# Patient Record
Sex: Female | Born: 1935 | Race: White | Hispanic: No | Marital: Single | Smoking: Former smoker
Health system: Southern US, Community
[De-identification: ages and names within clinical notes are randomized; demographics above are authoritative.]

## PROBLEM LIST (undated history)

## (undated) DIAGNOSIS — I639 Cerebral infarction, unspecified: Secondary | ICD-10-CM

## (undated) DIAGNOSIS — J189 Pneumonia, unspecified organism: Secondary | ICD-10-CM

## (undated) DIAGNOSIS — R569 Unspecified convulsions: Secondary | ICD-10-CM

## (undated) DIAGNOSIS — M1990 Unspecified osteoarthritis, unspecified site: Secondary | ICD-10-CM

## (undated) DIAGNOSIS — F32A Depression, unspecified: Secondary | ICD-10-CM

## (undated) DIAGNOSIS — E785 Hyperlipidemia, unspecified: Secondary | ICD-10-CM

## (undated) DIAGNOSIS — I4891 Unspecified atrial fibrillation: Secondary | ICD-10-CM

## (undated) DIAGNOSIS — F419 Anxiety disorder, unspecified: Secondary | ICD-10-CM

## (undated) DIAGNOSIS — R55 Syncope and collapse: Secondary | ICD-10-CM

## (undated) DIAGNOSIS — I10 Essential (primary) hypertension: Secondary | ICD-10-CM

## (undated) DIAGNOSIS — E039 Hypothyroidism, unspecified: Secondary | ICD-10-CM

## (undated) DIAGNOSIS — F329 Major depressive disorder, single episode, unspecified: Secondary | ICD-10-CM

## (undated) DIAGNOSIS — I82409 Acute embolism and thrombosis of unspecified deep veins of unspecified lower extremity: Secondary | ICD-10-CM

## (undated) DIAGNOSIS — R42 Dizziness and giddiness: Secondary | ICD-10-CM

## (undated) HISTORY — PX: PLEURAL SCARIFICATION: SHX748

## (undated) HISTORY — DX: Essential (primary) hypertension: I10

## (undated) HISTORY — DX: Hyperlipidemia, unspecified: E78.5

## (undated) HISTORY — PX: JOINT REPLACEMENT: SHX530

## (undated) HISTORY — DX: Unspecified atrial fibrillation: I48.91

## (undated) HISTORY — PX: TONSILLECTOMY AND ADENOIDECTOMY: SUR1326

---

## 1979-09-20 HISTORY — PX: LUMBAR DISC SURGERY: SHX700

## 1980-09-19 HISTORY — PX: APPENDECTOMY: SHX54

## 1980-09-19 HISTORY — PX: VAGINAL HYSTERECTOMY: SUR661

## 2001-01-11 ENCOUNTER — Encounter (INDEPENDENT_AMBULATORY_CARE_PROVIDER_SITE_OTHER): Payer: Self-pay | Admitting: Specialist

## 2001-01-11 ENCOUNTER — Other Ambulatory Visit: Admission: RE | Admit: 2001-01-11 | Discharge: 2001-01-11 | Payer: Self-pay | Admitting: Internal Medicine

## 2005-12-06 ENCOUNTER — Ambulatory Visit: Payer: Self-pay | Admitting: Internal Medicine

## 2005-12-20 ENCOUNTER — Ambulatory Visit: Payer: Self-pay | Admitting: Internal Medicine

## 2006-06-19 ENCOUNTER — Encounter: Admission: RE | Admit: 2006-06-19 | Discharge: 2006-06-28 | Payer: Self-pay | Admitting: Family Medicine

## 2010-05-10 ENCOUNTER — Emergency Department (HOSPITAL_COMMUNITY): Admission: EM | Admit: 2010-05-10 | Discharge: 2010-05-10 | Payer: Self-pay | Admitting: Family Medicine

## 2011-05-21 ENCOUNTER — Emergency Department (HOSPITAL_COMMUNITY): Payer: Medicare Other

## 2011-05-21 ENCOUNTER — Inpatient Hospital Stay (HOSPITAL_COMMUNITY)
Admission: EM | Admit: 2011-05-21 | Discharge: 2011-05-27 | DRG: 023 | Disposition: A | Discharge status: Rehabilitation Facility | Payer: Medicare Other | Source: Ambulatory Visit | Attending: Neurology | Admitting: Neurology

## 2011-05-21 DIAGNOSIS — G8190 Hemiplegia, unspecified affecting unspecified side: Secondary | ICD-10-CM | POA: Diagnosis present

## 2011-05-21 DIAGNOSIS — I6340 Cerebral infarction due to embolism of unspecified cerebral artery: Principal | ICD-10-CM | POA: Diagnosis present

## 2011-05-21 DIAGNOSIS — J9600 Acute respiratory failure, unspecified whether with hypoxia or hypercapnia: Secondary | ICD-10-CM | POA: Diagnosis present

## 2011-05-21 DIAGNOSIS — R1310 Dysphagia, unspecified: Secondary | ICD-10-CM | POA: Diagnosis present

## 2011-05-21 DIAGNOSIS — R29810 Facial weakness: Secondary | ICD-10-CM | POA: Diagnosis present

## 2011-05-21 DIAGNOSIS — I4891 Unspecified atrial fibrillation: Secondary | ICD-10-CM | POA: Diagnosis present

## 2011-05-21 DIAGNOSIS — I10 Essential (primary) hypertension: Secondary | ICD-10-CM | POA: Diagnosis present

## 2011-05-21 DIAGNOSIS — E876 Hypokalemia: Secondary | ICD-10-CM | POA: Diagnosis not present

## 2011-05-21 DIAGNOSIS — R42 Dizziness and giddiness: Secondary | ICD-10-CM | POA: Diagnosis present

## 2011-05-21 DIAGNOSIS — D72829 Elevated white blood cell count, unspecified: Secondary | ICD-10-CM | POA: Diagnosis present

## 2011-05-21 DIAGNOSIS — I639 Cerebral infarction, unspecified: Secondary | ICD-10-CM

## 2011-05-21 HISTORY — DX: Cerebral infarction, unspecified: I63.9

## 2011-05-21 HISTORY — PX: THROMBOLYSIS: SHX2508

## 2011-05-21 LAB — COMPREHENSIVE METABOLIC PANEL WITH GFR
ALT: 24 U/L (ref 0–35)
AST: 32 U/L (ref 0–37)
Albumin: 3.5 g/dL (ref 3.5–5.2)
Alkaline Phosphatase: 50 U/L (ref 39–117)
BUN: 18 mg/dL (ref 6–23)
CO2: 24 meq/L (ref 19–32)
Calcium: 9.8 mg/dL (ref 8.4–10.5)
Chloride: 98 meq/L (ref 96–112)
Creatinine, Ser: 0.85 mg/dL (ref 0.50–1.10)
GFR calc Af Amer: >60 mL/min
GFR calc non Af Amer: >60 mL/min
Glucose, Bld: 129 mg/dL — ABNORMAL HIGH (ref 70–99)
Potassium: 3.8 meq/L (ref 3.5–5.1)
Sodium: 134 meq/L — ABNORMAL LOW (ref 135–145)
Total Bilirubin: 0.3 mg/dL (ref 0.3–1.2)
Total Protein: 7 g/dL (ref 6.0–8.3)

## 2011-05-21 LAB — CBC
HCT: 37.9 % (ref 36.0–46.0)
Hemoglobin: 13.3 g/dL (ref 12.0–15.0)
MCH: 31.5 pg (ref 26.0–34.0)
MCHC: 35.1 g/dL (ref 30.0–36.0)
MCV: 89.8 fL (ref 78.0–100.0)
Platelets: 171 K/uL (ref 150–400)
RBC: 4.22 MIL/uL (ref 3.87–5.11)
RDW: 13.6 % (ref 11.5–15.5)
WBC: 8.3 K/uL (ref 4.0–10.5)

## 2011-05-21 LAB — PROTIME-INR: Prothrombin Time: 12.9 seconds (ref 11.6–15.2)

## 2011-05-21 LAB — URINALYSIS, ROUTINE W REFLEX MICROSCOPIC
Bilirubin Urine: NEGATIVE
Glucose, UA: NEGATIVE mg/dL
Hgb urine dipstick: NEGATIVE
Ketones, ur: NEGATIVE mg/dL
Leukocytes, UA: NEGATIVE
Nitrite: NEGATIVE
Protein, ur: NEGATIVE mg/dL
Specific Gravity, Urine: 1.014 (ref 1.005–1.030)
Urobilinogen, UA: 0.2 mg/dL (ref 0.0–1.0)
pH: 7.5 (ref 5.0–8.0)

## 2011-05-21 LAB — GLUCOSE, CAPILLARY: Glucose-Capillary: 129 mg/dL — ABNORMAL HIGH (ref 70–99)

## 2011-05-21 LAB — DIFFERENTIAL
Eosinophils Absolute: 0.1 10*3/uL (ref 0.0–0.7)
Lymphs Abs: 1.5 10*3/uL (ref 0.7–4.0)
Neutro Abs: 5.9 10*3/uL (ref 1.7–7.7)
Neutrophils Relative %: 71 % (ref 43–77)

## 2011-05-21 LAB — POCT I-STAT, CHEM 8
HCT: 45 % (ref 36.0–46.0)
Hemoglobin: 15.3 g/dL — ABNORMAL HIGH (ref 12.0–15.0)
Sodium: 135 mEq/L (ref 135–145)
TCO2: 22 mmol/L (ref 0–100)

## 2011-05-21 LAB — CK TOTAL AND CKMB (NOT AT ARMC)
CK, MB: 3 ng/mL (ref 0.3–4.0)
Relative Index: INVALID (ref 0.0–2.5)
Total CK: 88 U/L (ref 7–177)

## 2011-05-21 LAB — APTT: aPTT: 27 seconds (ref 24–37)

## 2011-05-21 MED ORDER — IOHEXOL 350 MG/ML SOLN
60.0000 mL | Freq: Once | INTRAVENOUS | Status: AC | PRN
  Administered 2011-05-21: 60 mL via INTRAVENOUS

## 2011-05-21 MED ORDER — IOHEXOL 300 MG/ML  SOLN
200.0000 mL | Freq: Once | INTRAMUSCULAR | Status: AC | PRN
  Administered 2011-05-21: 80 mL via INTRA_ARTERIAL

## 2011-05-22 ENCOUNTER — Inpatient Hospital Stay (HOSPITAL_COMMUNITY): Payer: Medicare Other

## 2011-05-22 DIAGNOSIS — I6330 Cerebral infarction due to thrombosis of unspecified cerebral artery: Secondary | ICD-10-CM

## 2011-05-22 DIAGNOSIS — Z9911 Dependence on respirator [ventilator] status: Secondary | ICD-10-CM

## 2011-05-22 DIAGNOSIS — J9600 Acute respiratory failure, unspecified whether with hypoxia or hypercapnia: Secondary | ICD-10-CM

## 2011-05-22 LAB — CBC
MCV: 89.2 fL (ref 78.0–100.0)
Platelets: 154 10*3/uL (ref 150–400)
RBC: 3.24 MIL/uL — ABNORMAL LOW (ref 3.87–5.11)
WBC: 8.4 10*3/uL (ref 4.0–10.5)

## 2011-05-22 LAB — BASIC METABOLIC PANEL
BUN: 11 mg/dL (ref 6–23)
BUN: 8 mg/dL (ref 6–23)
CO2: 22 mEq/L (ref 19–32)
CO2: 23 mEq/L (ref 19–32)
Calcium: 7.8 mg/dL — ABNORMAL LOW (ref 8.4–10.5)
Chloride: 109 mEq/L (ref 96–112)
Chloride: 105 mEq/L (ref 96–112)
Chloride: 105 mEq/L (ref 96–112)
Creatinine, Ser: 0.64 mg/dL (ref 0.50–1.10)
GFR calc Af Amer: >60 mL/min (ref >60)
Glucose, Bld: 101 mg/dL — ABNORMAL HIGH (ref 70–99)
Potassium: 2.9 mEq/L — ABNORMAL LOW (ref 3.5–5.1)
Potassium: 3.2 mEq/L — ABNORMAL LOW (ref 3.5–5.1)
Sodium: 139 mEq/L (ref 135–145)
Sodium: 136 mEq/L (ref 135–145)

## 2011-05-22 LAB — DIFFERENTIAL
Basophils Absolute: 0 10*3/uL (ref 0.0–0.1)
Eosinophils Absolute: 0 10*3/uL (ref 0.0–0.7)
Lymphs Abs: 1.1 10*3/uL (ref 0.7–4.0)
Neutrophils Relative %: 75 % (ref 43–77)

## 2011-05-22 LAB — BLOOD GAS, ARTERIAL
Acid-base deficit: 1.4 mmol/L (ref 0.0–2.0)
Drawn by: 331761
FIO2: 1 %
RATE: 12 resp/min
TCO2: 23.8 mmol/L (ref 0–100)
pCO2 arterial: 37.2 mmHg (ref 35.0–45.0)
pO2, Arterial: 407 mmHg — ABNORMAL HIGH (ref 80.0–100.0)

## 2011-05-22 LAB — GLUCOSE, CAPILLARY
Glucose-Capillary: 119 mg/dL — ABNORMAL HIGH (ref 70–99)
Glucose-Capillary: 98 mg/dL (ref 70–99)

## 2011-05-22 LAB — LIPID PANEL
Cholesterol: 158 mg/dL (ref 0–200)
HDL: 74 mg/dL (ref >39)
Total CHOL/HDL Ratio: 2.1 RATIO
Triglycerides: 72 mg/dL (ref <150)

## 2011-05-22 LAB — MRSA PCR SCREENING: MRSA by PCR: NEGATIVE

## 2011-05-22 LAB — HOMOCYSTEINE: Homocysteine: 4.3 umol/L (ref 4.0–15.4)

## 2011-05-23 DIAGNOSIS — Z9911 Dependence on respirator [ventilator] status: Secondary | ICD-10-CM

## 2011-05-23 DIAGNOSIS — J9600 Acute respiratory failure, unspecified whether with hypoxia or hypercapnia: Secondary | ICD-10-CM

## 2011-05-23 DIAGNOSIS — I6330 Cerebral infarction due to thrombosis of unspecified cerebral artery: Secondary | ICD-10-CM

## 2011-05-23 LAB — CBC
Hemoglobin: 12.2 g/dL (ref 12.0–15.0)
MCH: 31.9 pg (ref 26.0–34.0)
MCHC: 35.3 g/dL (ref 30.0–36.0)
Platelets: 169 10*3/uL (ref 150–400)
RDW: 13.8 % (ref 11.5–15.5)

## 2011-05-23 LAB — GLUCOSE, CAPILLARY
Glucose-Capillary: 94 mg/dL (ref 70–99)
Glucose-Capillary: 96 mg/dL (ref 70–99)
Glucose-Capillary: 99 mg/dL (ref 70–99)

## 2011-05-23 LAB — BASIC METABOLIC PANEL
Calcium: 8.5 mg/dL (ref 8.4–10.5)
GFR calc Af Amer: >60 mL/min (ref >60)
GFR calc non Af Amer: >60 mL/min (ref >60)
Glucose, Bld: 88 mg/dL (ref 70–99)
Sodium: 135 mEq/L (ref 135–145)

## 2011-05-23 NOTE — H&P (Signed)
[name], Rollins NO.:  [id number]  MEDICAL RECORD NO.:  [id number]  LOCATION:  3108                         FACILITY:  [facility]  PHYSICIAN:  Thana Farr, MD    DATE OF BIRTH:  10-Apr-1931  DATE OF ADMISSION:  05/21/2011 DATE OF DISCHARGE:                             HISTORY & PHYSICAL   ADMISSION DIAGNOSIS:  Cerebrovascular accident.  HISTORY:  [name] is a 77 year old female that was last seen normal by her husband at approximately 1500.  At 1510, the patient was found by the husband after hearing a thud in the bathroom.  He found the patient on the floor with left-sided weakness.  Patient was brought in by EMS as a code stroke.  Initial NIH stroke scale was 16.  PAST MEDICAL HISTORY:  Hypertension and vertigo.  MEDICATIONS:  Premarin and antihypertensives.  ALLERGIES:  No known drug allergies.  SOCIAL HISTORY:  The patient is married.  She has no history of alcohol, tobacco, or illicit drug abuse.  PHYSICAL EXAMINATION:  VITAL SIGNS:  Blood pressure 128/64, heart rate 98, respiratory rate 18, and O2 sat 100% on 2 liters nasal cannula. NEUROLOGIC:  The patient is awake and responsive.  There is no evidence of aphasia.  She does follow simple commands.  There is some dysarthria present.  On cranial nerve testing II, the patient has a questionable left homonymous hemianopsia.  III, IV, VI there is right eye deviation. The patient is unable to cross midline.  V and VII left facial droop. VIII grossly intact.  IX and X decreased gag.  XI and XII unable to test on motor testing.  The patient is able to maintain the right upper extremity and the right lower extremity against gravity.  Unable to maintain the left against gravity.  Sensory exam responds to painful stimuli throughout.  Deep tendon reflexes are 3+ with absent ankle jerks bilaterally.  Plantars are upgoing bilaterally.  LABORATORY DATA:  Sodium of 135, potassium 3.8, chloride 103, bicarb  22, BUN 18, creatinine 1.1, glucose 128, hemoglobin 15.3, hematocrit 45, INR 0.95.  CT shows no acute changes.  ASSESSMENT:  [name] is a 77 year old female presented with a right eye deviation and left-sided weakness and numbness.  Right brain stroke is suspected.  EKG shows AFib.  Suspect embolic etiology.  Risks and benefits of t-PA were discussed with the family.  He has agreed to t-PA.  PLAN: 1. IV t-PA administration. 2. Computed tomography angiography to rule out thrombus that is a     minimal to interventional therapy. 3. Admit to 3100.  ADDENDUM:  CTA performed and showed a right M1 occlusion that was persistent despite IV t-PA.  Dr. Corliss Skains was contacted.  Husband was spoken to at length about the options from this point forward and wishes for everything to be done.  Attempt for retrieval and removal of clot to be performed.  The patient is considered critically ill.  Further decompensation, the patient is at high risk for further neurologic decompensation.  High- level decision making was required and care of this patient.  Two and half hours of face-to-face time was spent in management and care of the patient.  ______________________________ Thana Farr, MD     LR/MEDQ  D:  05/21/2011  T:  05/21/2011  Job:  [id number]  Electronically Signed by Thana Farr MD on 05/23/2011 06:24:39 AM

## 2011-05-24 ENCOUNTER — Inpatient Hospital Stay (HOSPITAL_COMMUNITY): Payer: Medicare Other

## 2011-05-24 ENCOUNTER — Telehealth: Payer: Self-pay | Admitting: Cardiology

## 2011-05-24 DIAGNOSIS — I6330 Cerebral infarction due to thrombosis of unspecified cerebral artery: Secondary | ICD-10-CM

## 2011-05-24 LAB — BASIC METABOLIC PANEL
BUN: 11 mg/dL (ref 6–23)
Creatinine, Ser: 0.56 mg/dL (ref 0.50–1.10)
GFR calc Af Amer: >60 mL/min (ref >60)
GFR calc non Af Amer: >60 mL/min (ref >60)
Potassium: 3.7 mEq/L (ref 3.5–5.1)

## 2011-05-24 LAB — CBC
HCT: 35 % — ABNORMAL LOW (ref 36.0–46.0)
MCHC: 34.3 g/dL (ref 30.0–36.0)
MCV: 90.4 fL (ref 78.0–100.0)
RDW: 13.7 % (ref 11.5–15.5)

## 2011-05-24 NOTE — Telephone Encounter (Signed)
Left message for pt and husband that Dr Antoine Poche will be glad to see patient and they should call back to schedule an appointment.  Also left message that we have not received an EKG but if and when we do Dr Antoine Poche will review.

## 2011-05-24 NOTE — Telephone Encounter (Signed)
Pt husband calling. Pt husband is a pt of Dr. Antoine Poche and wanted to know if Dr. Antoine Poche can see pt wife as a new pt. Pt wife had a stroke on Saturday and had a electrocardiogram done while at the hospital. Pt husband requested that EKG be sent to our office for Dr. Antoine Poche to review. Please return pt husband call to advise whether or not Dr. Antoine Poche will see pt wife and/or his opinion of pt EKG.

## 2011-05-25 ENCOUNTER — Inpatient Hospital Stay (HOSPITAL_COMMUNITY): Payer: Medicare Other

## 2011-05-25 DIAGNOSIS — I4891 Unspecified atrial fibrillation: Secondary | ICD-10-CM

## 2011-05-25 LAB — GLUCOSE, CAPILLARY
Glucose-Capillary: 139 mg/dL — ABNORMAL HIGH (ref 70–99)
Glucose-Capillary: 163 mg/dL — ABNORMAL HIGH (ref 70–99)
Glucose-Capillary: 137 mg/dL — ABNORMAL HIGH (ref 70–99)

## 2011-05-26 LAB — BASIC METABOLIC PANEL
BUN: 12 mg/dL (ref 6–23)
Calcium: 8.6 mg/dL (ref 8.4–10.5)
Creatinine, Ser: <0.47 mg/dL — ABNORMAL LOW (ref 0.50–1.10)
Potassium: 3.9 mEq/L (ref 3.5–5.1)

## 2011-05-26 LAB — CBC
HCT: 35.4 % — ABNORMAL LOW (ref 36.0–46.0)
MCH: 31.6 pg (ref 26.0–34.0)
MCHC: 35.3 g/dL (ref 30.0–36.0)
MCV: 89.6 fL (ref 78.0–100.0)
RDW: 13.5 % (ref 11.5–15.5)

## 2011-05-26 LAB — GLUCOSE, CAPILLARY: Glucose-Capillary: 129 mg/dL — ABNORMAL HIGH (ref 70–99)

## 2011-05-27 ENCOUNTER — Inpatient Hospital Stay (HOSPITAL_COMMUNITY): Payer: Medicare Other

## 2011-05-27 ENCOUNTER — Inpatient Hospital Stay (HOSPITAL_COMMUNITY)
Admission: RE | Admit: 2011-05-27 | Discharge: 2011-06-13 | DRG: 945 | Disposition: A | Discharge status: Skilled Nursing Facility | Payer: Medicare Other | Source: Other Acute Inpatient Hospital | Attending: Physical Medicine & Rehabilitation | Admitting: Physical Medicine & Rehabilitation

## 2011-05-27 DIAGNOSIS — I6330 Cerebral infarction due to thrombosis of unspecified cerebral artery: Secondary | ICD-10-CM

## 2011-05-27 DIAGNOSIS — I6340 Cerebral infarction due to embolism of unspecified cerebral artery: Secondary | ICD-10-CM | POA: Diagnosis present

## 2011-05-27 DIAGNOSIS — I4891 Unspecified atrial fibrillation: Secondary | ICD-10-CM | POA: Diagnosis present

## 2011-05-27 DIAGNOSIS — Z5189 Encounter for other specified aftercare: Secondary | ICD-10-CM

## 2011-05-27 DIAGNOSIS — R471 Dysarthria and anarthria: Secondary | ICD-10-CM | POA: Diagnosis present

## 2011-05-27 DIAGNOSIS — E785 Hyperlipidemia, unspecified: Secondary | ICD-10-CM | POA: Diagnosis present

## 2011-05-27 DIAGNOSIS — R1310 Dysphagia, unspecified: Secondary | ICD-10-CM | POA: Diagnosis present

## 2011-05-27 DIAGNOSIS — Z7901 Long term (current) use of anticoagulants: Secondary | ICD-10-CM

## 2011-05-27 DIAGNOSIS — G8190 Hemiplegia, unspecified affecting unspecified side: Secondary | ICD-10-CM | POA: Diagnosis present

## 2011-05-27 LAB — GLUCOSE, CAPILLARY
Glucose-Capillary: 111 mg/dL — ABNORMAL HIGH (ref 70–99)
Glucose-Capillary: 113 mg/dL — ABNORMAL HIGH (ref 70–99)
Glucose-Capillary: 123 mg/dL — ABNORMAL HIGH (ref 70–99)

## 2011-05-27 LAB — MRSA PCR SCREENING: MRSA by PCR: NEGATIVE

## 2011-05-28 ENCOUNTER — Inpatient Hospital Stay (HOSPITAL_COMMUNITY): Payer: Medicare Other

## 2011-05-28 DIAGNOSIS — I6330 Cerebral infarction due to thrombosis of unspecified cerebral artery: Secondary | ICD-10-CM

## 2011-05-28 LAB — PROTIME-INR: Prothrombin Time: 14 seconds (ref 11.6–15.2)

## 2011-05-29 LAB — URINALYSIS, MICROSCOPIC ONLY
Bilirubin Urine: NEGATIVE
Glucose, UA: NEGATIVE mg/dL
Hgb urine dipstick: NEGATIVE
Protein, ur: NEGATIVE mg/dL
Urobilinogen, UA: 0.2 mg/dL (ref 0.0–1.0)

## 2011-05-29 LAB — PROTIME-INR: Prothrombin Time: 14.6 seconds (ref 11.6–15.2)

## 2011-05-30 DIAGNOSIS — I6330 Cerebral infarction due to thrombosis of unspecified cerebral artery: Secondary | ICD-10-CM

## 2011-05-30 LAB — CBC
Hemoglobin: 13 g/dL (ref 12.0–15.0)
MCH: 31.6 pg (ref 26.0–34.0)
MCHC: 35.3 g/dL (ref 30.0–36.0)
Platelets: 331 10*3/uL (ref 150–400)
RBC: 4.12 MIL/uL (ref 3.87–5.11)

## 2011-05-30 LAB — PROTIME-INR
INR: 1.31 (ref 0.00–1.49)
Prothrombin Time: 16.5 seconds — ABNORMAL HIGH (ref 11.6–15.2)

## 2011-05-30 LAB — DIFFERENTIAL
Basophils Absolute: 0.1 10*3/uL (ref 0.0–0.1)
Basophils Relative: 0 % (ref 0–1)
Eosinophils Absolute: 0.3 10*3/uL (ref 0.0–0.7)
Monocytes Absolute: 1.3 10*3/uL — ABNORMAL HIGH (ref 0.1–1.0)
Monocytes Relative: 11 % (ref 3–12)
Neutro Abs: 8.6 10*3/uL — ABNORMAL HIGH (ref 1.7–7.7)
Neutrophils Relative %: 74 % (ref 43–77)

## 2011-05-30 LAB — COMPREHENSIVE METABOLIC PANEL
AST: 40 U/L — ABNORMAL HIGH (ref 0–37)
Albumin: 2.7 g/dL — ABNORMAL LOW (ref 3.5–5.2)
BUN: 13 mg/dL (ref 6–23)
Calcium: 9.3 mg/dL (ref 8.4–10.5)
Chloride: 104 mEq/L (ref 96–112)
Creatinine, Ser: 0.7 mg/dL (ref 0.50–1.10)
Total Protein: 6.7 g/dL (ref 6.0–8.3)

## 2011-05-30 NOTE — Consult Note (Signed)
[name], [name] NO.:  [id number]  MEDICAL RECORD NO.:  [id number]  LOCATION:  3108                         FACILITY:  [facility]  PHYSICIAN:  Luis Abed, MD, FACCDATE OF BIRTH:  14-Apr-1931  DATE OF CONSULTATION: DATE OF DISCHARGE:                                CONSULTATION   The patient is admitted with a CVA.  She has not been seen by our cardiology group before.  Her husband is followed by Dr. Antoine Rollins and he requests that the patient to be followed by Dr. Antoine Rollins over time.  The patient was noted to be in atrial fibrillation upon arrival.  It is thought that most likely her stroke is related to her atrial fibrillation.  The patient was found by her husband on the bathroom floor.  She had left-sided weakness and she was brought to the emergency room with Code Stroke.  She received very careful and aggressive treatment.  Decision was made to proceed with tPA and direct intervention.  This was done successfully.  The patient says that she thinks she may have had atrial fibrillation. However, the husband is not aware of any formal diagnosis nor are the other family members.  There is no prior cardiac history that we are aware of.  There is a history of hypertension.  With admission with her stroke, the patient actually had respiratory failure which was treated by Critical Care Medicine and she is now extubated and quite stable.  PAST MEDICAL HISTORY:  ALLERGIES:  NO KNOWN DRUG ALLERGIES.  MEDICATIONS:  On admission, her meds included calcium, Norvasc 5 mg daily, multivitamin, fish oil, and Premarin.  For other medical problems, see the complete list below.  SOCIAL HISTORY:  The patient is married, lives with her husband.  She does not smoke.  FAMILY HISTORY:  The family history is not significant for coronary artery disease.  REVIEW OF SYSTEMS:  Review of systems at the moment is limited.  The patient has had a stroke and has left-sided  weakness.  She is not having any chest pain at this time.  She is not complaining of a headache.  She is drowsy.  Otherwise, her review of systems is limited.  PHYSICAL EXAMINATION:  VITAL SIGNS:  Blood pressure is 113/68.  Pulse is 115. GENERAL:  The patient as mentioned is alert. HEENT:  Head is atraumatic.  There is no jugular venous distention. LUNGS:  Reveal a few scattered rhonchi.  There is no respiratory distress. CARDIAC:  Exam reveals S1-S2.  There are no clicks or significant murmurs.  The rhythm is irregularly irregular. ABDOMEN:  Soft.  There is no peripheral edema. There are no musculoskeletal deformities.  The patient does have ecchymoses on her arms.  LABORATORY DATA:  Hemoglobin is 12.0.  BUN is 11 with creatinine 0.56 and potassium 3.7.  The chest x-ray on admission revealed no evidence of acute cardiopulmonary disease.  The patient had head CTs and interventional procedure to her head.  EKG reveals low voltage.  There is no diagnostic QRS change.  The rhythm is atrial fibrillation.  Two- dimensional echo was done.  The ejection fraction is 50-55%.  There are no focal wall motion  abnormalities.  No obvious clear-cut shunt could be seen.  There is no significant pericardial effusion.  PROBLEMS:  Include 1. Hypertension. 2. Ejection fraction 50-55%. 3. Low voltage QRS.  There is no evidence of significant pericardial     effusion.  This can be followed. 4. Respiratory failure with the patient's admission with her stroke     and this is now resolved. 5. Acute large cerebrovascular accident.  The patient was treated with     tPA and mechanical reperfusion.  She is recovering slowly. 6. Atrial fibrillation.  The duration is unknown.  As far as I can     tell this has not been treated medically in any way over time.  At     this point, I am recommending no further cardiac procedures.  The     family has asked me about the possibility of transesophageal echo.     I am  not formally recommending that at this point unless the Stroke     Team feels that it would be helpful to gather more information.  It     appears that her stroke most likely is related to her atrial     fibrillation.  The key issues for her atrial fibrillation are rate control and anticoagulation.  The patient will be appropriate anticoagulated by the Stroke Team when it is allowable.  For rate control, we could use either Cardizem or digoxin.  Because she is already on Norvasc for blood pressure control, I feel it would be helpful to try to switch her from Norvasc to Cardizem.  Hopefully, this will keep a similar type of blood pressure control and help control her heart rate.  If her blood pressure is too low to use this medication digoxin could be used.  She is stable with her current heart rate.     Luis Abed, MD, [facility]     JDK/MEDQ  D:  05/25/2011  T:  05/25/2011  Job:  [id number]  Electronically Signed by Willa Rough MD FACC on 05/30/2011 11:41:18 AM

## 2011-05-31 DIAGNOSIS — G8110 Spastic hemiplegia affecting unspecified side: Secondary | ICD-10-CM

## 2011-05-31 DIAGNOSIS — I6330 Cerebral infarction due to thrombosis of unspecified cerebral artery: Secondary | ICD-10-CM

## 2011-05-31 LAB — URINE MICROSCOPIC-ADD ON

## 2011-05-31 LAB — URINALYSIS, ROUTINE W REFLEX MICROSCOPIC
Ketones, ur: NEGATIVE mg/dL
Protein, ur: NEGATIVE mg/dL
Urobilinogen, UA: 0.2 mg/dL (ref 0.0–1.0)

## 2011-05-31 LAB — CBC
MCH: 31.5 pg (ref 26.0–34.0)
Platelets: 345 10*3/uL (ref 150–400)
RBC: 3.94 MIL/uL (ref 3.87–5.11)
RDW: 13.7 % (ref 11.5–15.5)
WBC: 10.8 10*3/uL — ABNORMAL HIGH (ref 4.0–10.5)

## 2011-05-31 LAB — URINE CULTURE
Colony Count: >=100000
Culture  Setup Time: 201209091126

## 2011-05-31 LAB — PROTIME-INR: Prothrombin Time: 18.7 seconds — ABNORMAL HIGH (ref 11.6–15.2)

## 2011-06-01 ENCOUNTER — Telehealth: Payer: Self-pay | Admitting: Cardiology

## 2011-06-01 LAB — PROTIME-INR
INR: 1.73 — ABNORMAL HIGH (ref 0.00–1.49)
Prothrombin Time: 20.6 seconds — ABNORMAL HIGH (ref 11.6–15.2)

## 2011-06-01 NOTE — Telephone Encounter (Signed)
Pt is at rehab at [facility].  He has question about her medical diagnosis.  Husband does not like what the rehab is telling him about how long she will be staying.   Please call him and discuss her care.

## 2011-06-01 NOTE — Telephone Encounter (Signed)
Per husband who is also Dr Hochrein's pt - requests that Dr Antoine Poche know that he would like the pt to stay at [facility] for as long as possible.  Instructed pt that Dr Antoine Poche really doesn't have anything to do with the evaluation of how long patient stays but that I would forward the message.

## 2011-06-02 LAB — PROTIME-INR
INR: 1.93 — ABNORMAL HIGH (ref 0.00–1.49)
Prothrombin Time: 22.4 seconds — ABNORMAL HIGH (ref 11.6–15.2)

## 2011-06-02 LAB — URINE CULTURE

## 2011-06-03 DIAGNOSIS — I6330 Cerebral infarction due to thrombosis of unspecified cerebral artery: Secondary | ICD-10-CM

## 2011-06-03 DIAGNOSIS — G8110 Spastic hemiplegia affecting unspecified side: Secondary | ICD-10-CM

## 2011-06-03 LAB — PROTIME-INR
INR: 2.12 — ABNORMAL HIGH (ref 0.00–1.49)
Prothrombin Time: 24.1 seconds — ABNORMAL HIGH (ref 11.6–15.2)

## 2011-06-04 LAB — PROTIME-INR
INR: 2.28 — ABNORMAL HIGH (ref 0.00–1.49)
Prothrombin Time: 25.5 seconds — ABNORMAL HIGH (ref 11.6–15.2)

## 2011-06-04 NOTE — Discharge Summary (Signed)
[name], [name]              ACCOUNT NO.:  [id number]  MEDICAL RECORD NO.:  [id number]  LOCATION:  3108                         FACILITY:  [facility]  PHYSICIAN:  Pramod P. Pearlean Brownie, MD    DATE OF BIRTH:  October 18, 1930  DATE OF ADMISSION:  05/21/2011 DATE OF DISCHARGE:  05/27/2011                              DISCHARGE SUMMARY   ADMISSION DIAGNOSIS:  Stroke.  DISCHARGE DIAGNOSES: 1. Right middle cerebral artery embolic stroke secondary to atrial     fibrillation, status post thrombolysis with IV t-PA and intra-     arterial t-PA of 8.5 mg as well as mechanical thrombectomy with a     Solitaire device with residual partial recanalization of the     superior division of the right middle cerebral artery. 2. Atrial fibrillation. 3. Dysphagia for stroke.  HOSPITAL COURSE:  [name] was 77 year old Caucasian lady who was admitted as a code stroke when she was found unresponsive by her husband when heard occurred in the bathroom.  She was brought in by EMS as a code stroke.  Initial NIH stroke scale was 16.  She was found to have right gaze deviation, inability to cross the midline, left facial weakness and dense left hemiplegia.  CT scan of the head on admission showed no acute abnormality.  EKG showed atrial fibrillation, which was new onset.  The patient was given full-dose IV t-PA and taken for an emergent cerebral catheter angiogram by Dr. Corliss Skains, who found occlusion of the right middle cerebral artery and the M1 segment.  The patient was given 8.5 mg of intra-arterial t-PA as well as her mechanical embolectomy was performed using the Solitaire device.  This resulted in recanalization of the middle cerebral artery, but there was persistent occlusion of the M2 branch of the right middle cerebral artery.  Her post-postprocedure CT scan showed some contrast extravasation in the right basal ganglia.  Subsequent followup CT scan showed large infarct in the right basal ganglia  involving small parts of the temporoparietal and insular cortex as well.  There was 6-mm right-to- left midline shift and cerebral edema.  The patient was kept in the Neurological Intensive Care Unit.  She was initially intubated and subsequently extubated the next day.  She was found to be drowsy, but arousable following commands.  She had some right gaze preference, which improved over the next few days.  She had persistent left upper extremity weakness, but she started moving the left lower extremity against gravity.  Followup CT scan showed stable appearance of the cerebral edema and midline shift.  He initially she had some eye-opening hyperoxia for the first few days, but on day of discharge, she was able to open her eyes widely and follow commands and she could easily mobilize past midline.  She had grade 1/5 strength in the left upper extremity and grade 3/5 in the left upper extremity.  She was not able to swallow initially and hence had a Panda tube and was given tube feeds, but at the time of discharge, she just passed the modified barium swallow and was advised a dysphagia 1 diet with pureed diet with nectar- thick liquids.  The plan was to start  Coumadin at the time of discharge and she was evaluated by Physical and Occupational Therapy and Rehab would not felt to be good patient for inpatient rehab.  She was discharged to rehab in a stable condition.  Instructions to follow up with Dr. Pearlean Brownie in his office in 2 months and primary care physician in a month.  MEDICATIONS AT THE TIME OF DISCHARGE: 1. Coumadin to be started by pharmacy and dosed to keep her INR 2-3. 2. Aspirin 81 mg daily until INR is 2-3 and then to be stopped. 3. Cardizem 60 mg q.6 hourly. 4. Lovenox 40 mg subcu DVT until INR is about 2, and then to be     stopped. 5. Multivitamins one tablet daily. 6. Lovaza 1 g daily.     Pramod P. Pearlean Brownie, MD     PPS/MEDQ  D:  05/27/2011  T:  05/27/2011  Job:   [id number]  Electronically Signed by Delia Heady MD on 06/04/2011 04:14:16 PM

## 2011-06-06 DIAGNOSIS — I6330 Cerebral infarction due to thrombosis of unspecified cerebral artery: Secondary | ICD-10-CM

## 2011-06-06 DIAGNOSIS — G8110 Spastic hemiplegia affecting unspecified side: Secondary | ICD-10-CM

## 2011-06-07 DIAGNOSIS — G8110 Spastic hemiplegia affecting unspecified side: Secondary | ICD-10-CM

## 2011-06-07 DIAGNOSIS — I6330 Cerebral infarction due to thrombosis of unspecified cerebral artery: Secondary | ICD-10-CM

## 2011-06-08 ENCOUNTER — Inpatient Hospital Stay (HOSPITAL_COMMUNITY): Payer: Medicare Other

## 2011-06-08 LAB — PROTIME-INR: INR: 2.56 — ABNORMAL HIGH (ref 0.00–1.49)

## 2011-06-09 DIAGNOSIS — G8110 Spastic hemiplegia affecting unspecified side: Secondary | ICD-10-CM

## 2011-06-09 DIAGNOSIS — I6330 Cerebral infarction due to thrombosis of unspecified cerebral artery: Secondary | ICD-10-CM

## 2011-06-09 NOTE — H&P (Signed)
[name], ATOR              ACCOUNT NO.:  [id number]  MEDICAL RECORD NO.:  [id number]  LOCATION:  4000                         FACILITY:  [facility]  PHYSICIAN:  Ranelle Oyster, M.D.DATE OF BIRTH:  [birth year]  DATE OF ADMISSION:  05/27/2011 DATE OF DISCHARGE:                             HISTORY & PHYSICAL   PRIMARY CARE PHYSICIAN:  Larina Earthly, MD  CHIEF COMPLAINT:  Left-sided weakness.  HISTORY OF PRESENT ILLNESS:  This is a 77 year old white female with hypertension admitted on May 21, 2011, after being found down by her husband at home.  She had left-sided weakness.  CT showed right MCA infarct as well as trace subarachnoid hypodensity.  Echocardiogram showed ejection fraction of 55%.  Carotid Dopplers were negative.  CT angio showed occlusion of the right M1 segment.  The patient is status post tPA and thrombectomy.  Followup head CT was stable.  Aspirin and subcu Lovenox were initiated per Neurology Team.  Hospital course complicated by AFib.  Cardiology has been following and placed the patient on IV Cardizem.  She is presently n.p.o. with a swallow scheduled for today.  She continues to have an NG tube in place.  Plans for Coumadin to begin once p.o. intake is reliable.  REVIEW OF SYSTEMS:  Notable for the above.  She has bruising on her left arm.  She has had incontinence.  Some vertigo has been reported in the past.  Full review is in the written H and P.  PAST MEDICAL HISTORY: 1. Hypertension. 2. Hyperlipidemia.  HABITS:  She denies alcohol or tobacco.  FAMILY HISTORY:  Negative.  SOCIAL HISTORY:  The patient lives her husband who can assist as needed. They also hire help apparently.  They have other family, but most of them live outside the area.  They have a two-level house with bedroom upstairs and multiple steps to enter.  ALLERGIES:  None.  HOME MEDICATIONS:  Fish oil, Norvasc, Premarin, multivitamin, and calcium.  LABORATORY DATA:   Hemoglobin 12.5, BUN 12, creatinine 0.4, sodium 139, and potassium 3.9.  PHYSICAL EXAMINATION:  VITAL SIGNS:  Blood pressure is 111/74, pulse 71, respiratory 18, and temperature 98. GENERAL:  The patient is sitting in bed, generally alert.  She has a right gaze preference.  NG tube is in place. HEENT:  Pupils equal, round, and reactive to light.  Nose and throat exam is notable for dry mucosa.  Dentition is fair. NECK:  Supple without JVD or lymphadenopathy. CHEST:  Clear to auscultation bilaterally without wheezes, rales, or rhonchi. HEART:  Regular rate and rhythm without murmurs, rubs, or gallops. ABDOMEN:  Soft and nontender.  Bowel sounds are positive. SKIN:  Notable for multiple bruises, particularly on the left side.  The entire left arm was bruised with some slight swelling surrounding that as well. NEUROLOGIC:  Cranial nerves II through XII are notable for left central VII tongue deviation.  The patient is very dysarthric.  She has a left inattention as noted above.  Reflexes are 1+ throughout.  Sensations is present to pain, but is somewhat diminished to light touch and pain sensation on the left.  Motor function was 1-2/5, but there is no element of apraxia  and inattention to the left arm.  Left leg seemed to move better and was in the realm of 2+ to 3+/5 proximal and distal, but inconsistent.  Right upper and lower extremity are grossly 3+ to 4/5, but again inconsistent due to her attention level.  Judgment was fair. The patient could follow simple commands.  She spoke a few words, but was very dysphonic and dysarthria.  She is oriented to name only.  Mood is flat at best.  POST ADMISSION PHYSICIAN EVALUATION: 1. Functional deficit secondary to right MCA infarct with left     hemiparesis, inattention, dysarthria, and dysphagia. 2. The patient is admitted to receive collaborative interdisciplinary     care between the physiatrist, rehab nursing staff, and therapy      team. 3. The patient's level of medical complexity and substantial therapy     needs in context of that medical necessity cannot be provided at a     lesser intensity of care. 4. The patient has experienced substantial functional loss from her     baseline.  Premorbidly, she was independent and driving.     Currently, she is min to mod assist bed mobility, mod assist     transfers, and max assist ADLs.  Gait has not been tested.  Judging     by the patient's diagnosis, physical exam, and functional history,     she has potential for functional progress which will result in     measurable gains while in inpatient rehab.  These gains will be of     substantial and practical use upon discharge to home in     facilitating mobility and self-care. 5. The physiatrist will provide 24-hour management of medical needs as     well as oversight of the therapy plan/treatment and provide     guidance as appropriate regarding interaction of the two.  Medical     problem list and plan are below. 6. A 24-hour rehab nursing team will assist in management of the     patient's skin care needs as well as bowel and bladder function,     safety awareness, integration of therapy concepts, and techniques. 7. PT will assess and treat for lower extremity strength, range of     motion, visual and spatial deficits, neuromuscular education,     family education, safety with goals, min assist as wheelchair to     perhaps short distance ambulation level. 8. OT will assess and treat for upper extremity use ADLs, adaptive     techniques, equipment, functional mobility, safety, neuromuscular     education, visual and spatial, cognitive and perceptual training,     family education with goals min to mod assist. 9. Speech Language Pathology will follow and treat for swallowing     needs as well as speech and communication and cognition.  Goals     ranging from supervision to min assist. 10.Case Management and social  worker will assess and treat for     psychosocial issues and discharge planning. 11.Team conferences will be held weekly to assess progress towards     goals and to determine patient's barriers at discharge. 12.The patient has demonstrated sufficient medical stability and     exercise capacity to tolerate at least 3 hours of therapy per day     at least 5 days per week. 13.Estimated length of stay is 3-4 weeks.  Prognosis is good.  Family     seems supportive.  MEDICAL PROBLEM LIST AND PLAN:  1. Anticoagulation/deep venous thrombosis prophylaxis:  The patient is     still on subcutaneous Lovenox and aspirin.  Once she progresses to     regular oral intake, we will initiate Coumadin therapy.  Follow     INRs at that point as well as continue to check for ongoing signs     of blood loss and complications related to her anticoagulation. 2. Dysphagia:  Judging by the patient's examination today, I am not     optimistic that she will be able to advance quickly with her swallow.  At best, she     will be on a low level diet and likely will need tube feeds     supplementation in my opinion.  We will await results and decide on     the plan going forward. 3. Atrial fibrillation:  Heart rates are currently controlled.  Heart     rate was regular on examination today.  Again, plan Coumadin once     she is taking adequate p.o.  Cardizem onboard for control. 4. Hyperlipidemia:  Lovaza. 5. Skin care:  Frequent turning and precautions.  Skin appears to be     fragile and she appears to be at risk for breakdown.  Also need to     carefully monitor bladder function as the patient was incontinent     today and wet throughout her back side.     Ranelle Oyster, M.D.     ZTS/MEDQ  D:  05/27/2011  T:  05/27/2011  Job:  [id number]  cc:   Larina Earthly, M.D.  Electronically Signed by Faith Rogue M.D. on 06/09/2011 10:11:09 AM

## 2011-06-10 LAB — PROTIME-INR
INR: 2.03 — ABNORMAL HIGH (ref 0.00–1.49)
Prothrombin Time: 23.3 seconds — ABNORMAL HIGH (ref 11.6–15.2)

## 2011-06-10 LAB — URINALYSIS, ROUTINE W REFLEX MICROSCOPIC
Glucose, UA: NEGATIVE mg/dL
Leukocytes, UA: NEGATIVE
Nitrite: NEGATIVE
Specific Gravity, Urine: 1.013 (ref 1.005–1.030)
pH: 7 (ref 5.0–8.0)

## 2011-06-10 LAB — CBC
HCT: 32.3 % — ABNORMAL LOW (ref 36.0–46.0)
Hemoglobin: 11.1 g/dL — ABNORMAL LOW (ref 12.0–15.0)
MCHC: 34.4 g/dL (ref 30.0–36.0)
WBC: 11.2 10*3/uL — ABNORMAL HIGH (ref 4.0–10.5)

## 2011-06-11 LAB — PROTIME-INR: Prothrombin Time: 24.2 seconds — ABNORMAL HIGH (ref 11.6–15.2)

## 2011-06-11 LAB — URINE CULTURE: Special Requests: NEGATIVE

## 2011-06-13 LAB — PROTIME-INR: INR: 1.9 — ABNORMAL HIGH (ref 0.00–1.49)

## 2011-06-21 NOTE — Discharge Summary (Signed)
[name], [name]              ACCOUNT NO.:  [id number]  MEDICAL RECORD NO.:  [id number]  LOCATION:  4031                         FACILITY:  [facility]  PHYSICIAN:  Erick Colace, M.D.DATE OF BIRTH:  03-May-1931  DATE OF ADMISSION:  05/27/2011 DATE OF DISCHARGE:  06/13/2011                              DISCHARGE SUMMARY   DISCHARGING DIAGNOSES: 1. 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 2. Coumadin therapy. 3. Dysphagia. 4. Atrial fibrillation. 5. Hyperlipidemia.  A 77 year old white female is admitted on September 1 after being found down by her husband with left-sided weakness.  Cranial CT scan showed a right middle cerebral artery infarction as well as trace subarachnoid hyperdensity.  Echocardiogram with ejection fraction of 55% without emboli.  Carotid Doppler was negative.  CT angiogram with occlusion of right M1 segment, underwent t-PA thrombectomy per Interventional Radiology.  Followup cranial CT scan was stable.  Aspirin and subcutaneous Lovenox was initiated on September 5 per Neurology Services.  Hospital course with atrial fibrillation on admission. Followup Cardiology Services placed her on intravenous Cardizem.  The patient was made n.p.o. after modified barium swallow September 4 with a nasogastric tube placed and planned for modified barium swallow.  Plan would be to begin Coumadin therapy once able to take p.o. for both her atrial fibrillation and stroke.  A modified barium swallow was completed on September 7 and advised puree diet with nectar liquids.  Her nasogastric tube was discontinued.  Coumadin was initiated September 7. She is minimal-assist to moderate-assist for mobility.  She was admitted for comprehensive rehab program.  PAST MEDICAL HISTORY:  Hypertension, hyperlipidemia.  No alcohol or tobacco.  ALLERGIES:  None.  SOCIAL HISTORY:  She lives with her husband.  Children live out of the area.  Two-level home with  bedroom upstairs, multiple steps to entrance of home.  FUNCTIONAL HISTORY:  Upon admission to rehab services, was independent. She does drive.  Functional status upon admission to rehab services, was minimal-assist bed mobility, moderate-assist transfer, ambulation not tested, maximum-assistance for activities of daily living.  MEDICATIONS PRIOR TO ADMISSION: 1. Fish oil daily. 2. Norvasc 5 mg daily. 3. Premarin 0.3 mg daily. 4. Multivitamin daily. 5. Calcium twice daily.  PHYSICAL EXAM:  VITAL SIGNS:  Blood pressure 111/74, pulse 71, temperature 98, respirations 18. GENERAL:  This is a lethargic female.  She does make eye contact with examiner. NEUROLOGIC:  Deep tendon reflexes 2+.  Follows one-step commands. Multiple bruises noted.  Grimaces to deep palpation. LUNGS:  Clear to auscultation. CARDIAC:  Rate controlled. ABDOMEN:  Soft, nontender.  Good bowel sounds.  REHABILITATION/HOSPITAL COURSE:  The patient was admitted to Inpatient Rehab Services with therapies initiated on a 3-hour daily basis, consisting of physical therapy, occupational therapy, speech therapy, and rehabilitation nursing.  The following issues were addressed during the patient's rehabilitation stay.  Pertaining to [name]'s right middle cerebral artery infarction with left hemiplegia, remained stable. Ritalin was added to her regimen to help stimulate the mood as well as attention to tasks, which did provide some benefit.  She remained on chronic Coumadin therapy for both stroke as well as atrial fibrillation with a goal INR of 2.0-3.0 with latest INR  of 2.13.  She was followed by Speech Therapy for dysphagia after cerebrovascular accident.  Latest modified barium swallow on September 19, she is maintained on a dysphagia II thin-liquid diet and monitored closely.  Her heart rate and blood pressure remained well controlled.  She remained on Cardizem 60 mg every 6 hours.  The patient received weekly  collaborative interdisciplinary team conferences to discuss estimated length of stay, family teaching, and any barriers to her discharge.  She was minimum to max assist for mobility overall, largely fluctuates secondary to her attention, moderate assist upper body, minimal assist for lower body dressing, minimal assist for transfers.  She needed supervision for basic communication.  Due to the fact that her husband can provide only limited assistance at home, it was felt by family that skilled nursing facility was needed, a bed becoming available on September 24 and discharge taking place at that time.  Latest labs showed a INR of 2.13, hemoglobin 11.1, hematocrit 32.3, platelet 289,000.  Chemistries with sodium 140, potassium 3.5, BUN 13, creatinine 0.7.  DISCHARGE MEDICATIONS AT TIME OF DICTATION: 1. Os-Cal 1 tablet p.o. b.i.d. 2. Multivitamin 1 tablet p.o. daily. 3. Seroquel 12.5 mg p.o. at bedtime. 4. Ritalin 5 mg p.o. twice daily at 7:00 a.m. and 12 noon. 5. Aspirin 81 mg p.o. daily. 6. Cardizem 60 mg p.o. every 6 hours. 7. Ensure supplement 1 p.o. b.i.d. 8. Coumadin latest dose of 4.5 mg on Monday and Friday, 3 mg on all     other days; however, this would be adjusted accordingly for an INR     of 2.0-3.0.  DIET:  Her diet was a dysphagia II thin-liquid diet, no straws, full supervision, two to three swallows with each bite.  SPECIAL INSTRUCTIONS:  Continue to check prothrombin times weekly while on Coumadin therapy.  The patient could follow up with Cardiology service, Dr. Myrtis Ser, as needed; Dr. Delia Heady, Neurology service; Dr. Claudette Laws the Outpatient Rehab Service office as directed.     Mariam Dollar, P.A.   ______________________________ Erick Colace, M.D.    DA/MEDQ  D:  06/13/2011  T:  06/13/2011  Job:  [id number]  cc:   Erick Colace, M.D. Luis Abed, MD, [facility] Pramod P. Pearlean Brownie, MD Larina Earthly, M.D.  Electronically Signed by  Mariam Dollar P.A. on 06/15/2011 02:42:13 PM Electronically Signed by Claudette Laws M.D. on 06/21/2011 12:11:53 PM

## 2011-09-29 ENCOUNTER — Encounter: Payer: Self-pay | Admitting: *Deleted

## 2011-09-29 ENCOUNTER — Ambulatory Visit: Payer: Medicare Other | Admitting: *Deleted

## 2011-09-30 ENCOUNTER — Ambulatory Visit (INDEPENDENT_AMBULATORY_CARE_PROVIDER_SITE_OTHER): Payer: Medicare Other | Admitting: Cardiology

## 2011-09-30 ENCOUNTER — Encounter: Payer: Self-pay | Admitting: Cardiology

## 2011-09-30 DIAGNOSIS — E46 Unspecified protein-calorie malnutrition: Secondary | ICD-10-CM | POA: Insufficient documentation

## 2011-09-30 DIAGNOSIS — I639 Cerebral infarction, unspecified: Secondary | ICD-10-CM | POA: Insufficient documentation

## 2011-09-30 DIAGNOSIS — I6350 Cerebral infarction due to unspecified occlusion or stenosis of unspecified cerebral artery: Secondary | ICD-10-CM

## 2011-09-30 DIAGNOSIS — I4891 Unspecified atrial fibrillation: Secondary | ICD-10-CM | POA: Insufficient documentation

## 2011-09-30 NOTE — Assessment & Plan Note (Signed)
We discussed good calorie intake.

## 2011-09-30 NOTE — Assessment & Plan Note (Addendum)
The patient  tolerates this rhythm and rate control and anticoagulation. We will continue with the meds as listed except I think she can stop the ASA.   I reviewed all of the hospital records.

## 2011-09-30 NOTE — Assessment & Plan Note (Signed)
She and her husband had a question about whether she needs to continue the Ritalin.  I have posed this question to her neurologist.

## 2011-09-30 NOTE — Patient Instructions (Signed)
Please stop your Asprin Continue all other medications as listed  Follow up in 4 months with Dr Antoine Poche.  You will receive a letter in the mail 2 months before you are due.  Please call us when you receive this letter to schedule your follow up appointment.

## 2011-09-30 NOTE — Progress Notes (Signed)
   HPI The patient presents for followup after hospitalization with a right MCA stroke. This was related to new onset atrial fibrillation. She had thrombolysis and thrombectomy. She's made a nice recovery from this with some residual left upper extremity weakness. She still doing occupational therapy. She does notice occasional palpitations but she's not bothered by this. She's not having any presyncope or syncope. She's not having any chest pressure, neck or arm discomfort. She denies any shortness of breath, PND or orthopnea. She's lost weight and is trying to keep her calories up. She's had no edema. She tolerates warfarin.  No Known Allergies  Current Outpatient Prescriptions  Medication Sig Dispense Refill  . aspirin 81 MG tablet Take 81 mg by mouth daily.      . Calcium Carbonate (CALTRATE 600 PO) Take 1 tablet by mouth daily.      Marland Kitchen diltiazem (DILACOR XR) 240 MG 24 hr capsule Take 240 mg by mouth daily.      Tery Sanfilippo Calcium (STOOL SOFTENER PO) Take by mouth as needed.      Marland Kitchen levothyroxine (SYNTHROID, LEVOTHROID) 50 MCG tablet Take 50 mcg by mouth daily.      . Multiple Vitamin (MULTIVITAMIN) capsule Take 1 capsule by mouth daily.      . polycarbophil (FIBERCON) 625 MG tablet Take 625 mg by mouth daily.      Marland Kitchen warfarin (COUMADIN) 5 MG tablet Take 5 mg by mouth as directed.        Past Medical History  Diagnosis Date  . Cerebral infarction   . Dysphagia   . Atrial fibrillation   . Hyperlipidemia   . HTN (hypertension)     ROS:   As stated in the HPI and negative for all other systems.  PHYSICAL EXAM BP 118/80  Pulse 101  Ht 5\' 8"  (1.727 m)  Wt 112 lb (50.803 kg)  BMI 17.03 kg/m2 GENERAL:   Frail appearing HEENT:  Pupils equal round and reactive, fundi not visualized, oral mucosa unremarkable NECK:  No jugular venous distention, waveform within normal limits, carotid upstroke brisk and symmetric, no bruits, no thyromegaly LYMPHATICS:  No cervical, inguinal  adenopathy LUNGS:  Clear to auscultation bilaterally BACK:  No CVA tenderness CHEST:  Unremarkable HEART:  PMI not displaced or sustained,S1 and S2 within normal limits, no S3,  no clicks, no rubs, no murmurs, irregular ABD:  Flat, positive bowel sounds normal in frequency in pitch, no bruits, no rebound, no guarding, no midline pulsatile mass, no hepatomegaly, no splenomegaly EXT:  2 plus pulses throughout, no edema, no cyanosis no clubbing, muscle wasting SKIN:  No rashes no nodules NEURO:  Cranial nerves II through XII grossly intact, diffuse weakness PSYCH:  Cognitively intact, oriented to person place and time  EKG:  Atrial fibrillation, rate 100, axis within normal limits, poor and her R wave progression, no acute ST-T wave changes.  ASSESSMENT AND PLAN

## 2011-10-06 ENCOUNTER — Ambulatory Visit: Payer: Medicare Other | Attending: Internal Medicine | Admitting: Occupational Therapy

## 2011-10-06 ENCOUNTER — Encounter: Payer: Medicare Other | Admitting: Occupational Therapy

## 2011-10-06 DIAGNOSIS — R279 Unspecified lack of coordination: Secondary | ICD-10-CM | POA: Insufficient documentation

## 2011-10-06 DIAGNOSIS — R4189 Other symptoms and signs involving cognitive functions and awareness: Secondary | ICD-10-CM | POA: Insufficient documentation

## 2011-10-06 DIAGNOSIS — IMO0001 Reserved for inherently not codable concepts without codable children: Secondary | ICD-10-CM | POA: Insufficient documentation

## 2011-10-06 DIAGNOSIS — M2420 Disorder of ligament, unspecified site: Secondary | ICD-10-CM | POA: Insufficient documentation

## 2011-10-06 DIAGNOSIS — I69959 Hemiplegia and hemiparesis following unspecified cerebrovascular disease affecting unspecified side: Secondary | ICD-10-CM | POA: Insufficient documentation

## 2011-10-06 DIAGNOSIS — M629 Disorder of muscle, unspecified: Secondary | ICD-10-CM | POA: Insufficient documentation

## 2011-10-13 ENCOUNTER — Ambulatory Visit: Payer: Medicare Other | Admitting: Occupational Therapy

## 2011-10-14 ENCOUNTER — Encounter: Payer: Medicare Other | Admitting: Occupational Therapy

## 2011-10-17 ENCOUNTER — Ambulatory Visit: Payer: Medicare Other | Admitting: Occupational Therapy

## 2011-10-19 ENCOUNTER — Ambulatory Visit: Payer: Medicare Other | Admitting: Occupational Therapy

## 2011-10-21 DIAGNOSIS — R569 Unspecified convulsions: Secondary | ICD-10-CM

## 2011-10-21 HISTORY — DX: Unspecified convulsions: R56.9

## 2011-10-24 ENCOUNTER — Ambulatory Visit: Payer: Medicare Other | Attending: Internal Medicine | Admitting: Occupational Therapy

## 2011-10-24 DIAGNOSIS — I69959 Hemiplegia and hemiparesis following unspecified cerebrovascular disease affecting unspecified side: Secondary | ICD-10-CM | POA: Insufficient documentation

## 2011-10-24 DIAGNOSIS — IMO0001 Reserved for inherently not codable concepts without codable children: Secondary | ICD-10-CM | POA: Insufficient documentation

## 2011-10-24 DIAGNOSIS — M2420 Disorder of ligament, unspecified site: Secondary | ICD-10-CM | POA: Insufficient documentation

## 2011-10-24 DIAGNOSIS — R279 Unspecified lack of coordination: Secondary | ICD-10-CM | POA: Insufficient documentation

## 2011-10-24 DIAGNOSIS — M629 Disorder of muscle, unspecified: Secondary | ICD-10-CM | POA: Insufficient documentation

## 2011-10-24 DIAGNOSIS — R4189 Other symptoms and signs involving cognitive functions and awareness: Secondary | ICD-10-CM | POA: Insufficient documentation

## 2011-10-26 ENCOUNTER — Ambulatory Visit: Payer: Medicare Other | Admitting: Occupational Therapy

## 2011-10-31 ENCOUNTER — Ambulatory Visit: Payer: Medicare Other | Admitting: Occupational Therapy

## 2011-11-03 ENCOUNTER — Ambulatory Visit: Payer: Medicare Other | Admitting: Occupational Therapy

## 2011-11-07 ENCOUNTER — Ambulatory Visit: Payer: Medicare Other | Admitting: Occupational Therapy

## 2011-11-09 ENCOUNTER — Ambulatory Visit: Payer: Medicare Other | Admitting: Occupational Therapy

## 2011-11-09 ENCOUNTER — Encounter: Payer: Medicare Other | Admitting: Occupational Therapy

## 2011-11-14 ENCOUNTER — Ambulatory Visit: Payer: Medicare Other | Admitting: Occupational Therapy

## 2011-11-15 ENCOUNTER — Other Ambulatory Visit (HOSPITAL_COMMUNITY): Payer: Medicare Other

## 2011-11-15 ENCOUNTER — Encounter (HOSPITAL_COMMUNITY): Payer: Self-pay | Admitting: Radiology

## 2011-11-15 ENCOUNTER — Inpatient Hospital Stay (HOSPITAL_COMMUNITY)
Admission: EM | Admit: 2011-11-15 | Discharge: 2011-11-17 | DRG: 065 | Disposition: A | Discharge status: Home / Self Care | Payer: Medicare Other | Source: Ambulatory Visit | Attending: Internal Medicine | Admitting: Internal Medicine

## 2011-11-15 ENCOUNTER — Emergency Department (HOSPITAL_COMMUNITY): Payer: Medicare Other

## 2011-11-15 ENCOUNTER — Other Ambulatory Visit: Payer: Self-pay

## 2011-11-15 DIAGNOSIS — I10 Essential (primary) hypertension: Secondary | ICD-10-CM | POA: Diagnosis present

## 2011-11-15 DIAGNOSIS — I6350 Cerebral infarction due to unspecified occlusion or stenosis of unspecified cerebral artery: Principal | ICD-10-CM | POA: Diagnosis present

## 2011-11-15 DIAGNOSIS — Z79899 Other long term (current) drug therapy: Secondary | ICD-10-CM

## 2011-11-15 DIAGNOSIS — I639 Cerebral infarction, unspecified: Secondary | ICD-10-CM | POA: Diagnosis present

## 2011-11-15 DIAGNOSIS — I69959 Hemiplegia and hemiparesis following unspecified cerebrovascular disease affecting unspecified side: Secondary | ICD-10-CM

## 2011-11-15 DIAGNOSIS — R791 Abnormal coagulation profile: Secondary | ICD-10-CM | POA: Diagnosis present

## 2011-11-15 DIAGNOSIS — R1310 Dysphagia, unspecified: Secondary | ICD-10-CM | POA: Diagnosis present

## 2011-11-15 DIAGNOSIS — R55 Syncope and collapse: Secondary | ICD-10-CM | POA: Diagnosis present

## 2011-11-15 DIAGNOSIS — Z87891 Personal history of nicotine dependence: Secondary | ICD-10-CM

## 2011-11-15 DIAGNOSIS — E039 Hypothyroidism, unspecified: Secondary | ICD-10-CM | POA: Diagnosis present

## 2011-11-15 DIAGNOSIS — W19XXXA Unspecified fall, initial encounter: Secondary | ICD-10-CM | POA: Diagnosis present

## 2011-11-15 DIAGNOSIS — Z8673 Personal history of transient ischemic attack (TIA), and cerebral infarction without residual deficits: Secondary | ICD-10-CM

## 2011-11-15 DIAGNOSIS — I69991 Dysphagia following unspecified cerebrovascular disease: Secondary | ICD-10-CM

## 2011-11-15 DIAGNOSIS — R569 Unspecified convulsions: Secondary | ICD-10-CM

## 2011-11-15 DIAGNOSIS — S0093XA Contusion of unspecified part of head, initial encounter: Secondary | ICD-10-CM

## 2011-11-15 DIAGNOSIS — Z7901 Long term (current) use of anticoagulants: Secondary | ICD-10-CM

## 2011-11-15 DIAGNOSIS — E785 Hyperlipidemia, unspecified: Secondary | ICD-10-CM | POA: Diagnosis present

## 2011-11-15 DIAGNOSIS — I4891 Unspecified atrial fibrillation: Secondary | ICD-10-CM | POA: Diagnosis present

## 2011-11-15 DIAGNOSIS — R29810 Facial weakness: Secondary | ICD-10-CM | POA: Diagnosis present

## 2011-11-15 HISTORY — DX: Cerebral infarction, unspecified: I63.9

## 2011-11-15 HISTORY — DX: Hypothyroidism, unspecified: E03.9

## 2011-11-15 HISTORY — DX: Pneumonia, unspecified organism: J18.9

## 2011-11-15 LAB — DIFFERENTIAL
Basophils Relative: 0 % (ref 0–1)
Eosinophils Absolute: 0.2 10*3/uL (ref 0.0–0.7)
Eosinophils Relative: 2 % (ref 0–5)
Monocytes Absolute: 0.8 10*3/uL (ref 0.1–1.0)
Monocytes Relative: 10 % (ref 3–12)

## 2011-11-15 LAB — APTT: aPTT: 31 seconds (ref 24–37)

## 2011-11-15 LAB — CBC
MCV: 89.3 fL (ref 78.0–100.0)
Platelets: 174 10*3/uL (ref 150–400)
RBC: 4.38 MIL/uL (ref 3.87–5.11)
WBC: 8.1 10*3/uL (ref 4.0–10.5)

## 2011-11-15 LAB — POCT I-STAT, CHEM 8
Calcium, Ion: 1.22 mmol/L (ref 1.12–1.32)
Creatinine, Ser: 1 mg/dL (ref 0.50–1.10)
Glucose, Bld: 121 mg/dL — ABNORMAL HIGH (ref 70–99)
HCT: 42 % (ref 36.0–46.0)
Hemoglobin: 14.3 g/dL (ref 12.0–15.0)

## 2011-11-15 LAB — CARDIAC PANEL(CRET KIN+CKTOT+MB+TROPI)
CK, MB: 2.2 ng/mL (ref 0.3–4.0)
Total CK: 54 U/L (ref 7–177)

## 2011-11-15 LAB — BASIC METABOLIC PANEL
CO2: 21 mEq/L (ref 19–32)
Chloride: 102 mEq/L (ref 96–112)
Creatinine, Ser: 0.88 mg/dL (ref 0.50–1.10)
Potassium: 3.6 mEq/L (ref 3.5–5.1)

## 2011-11-15 LAB — PROTIME-INR
INR: 1.9 — ABNORMAL HIGH (ref 0.00–1.49)
Prothrombin Time: 22.1 seconds — ABNORMAL HIGH (ref 11.6–15.2)

## 2011-11-15 MED ORDER — SODIUM CHLORIDE 0.9 % IV SOLN
INTRAVENOUS | Status: DC
  Administered 2011-11-15: 15:00:00 via INTRAVENOUS

## 2011-11-15 MED ORDER — DOCUSATE SODIUM 100 MG PO CAPS
100.0000 mg | ORAL_CAPSULE | Freq: Every day | ORAL | Status: DC
  Administered 2011-11-15 – 2011-11-16 (×2): 100 mg via ORAL
  Filled 2011-11-15: qty 1

## 2011-11-15 MED ORDER — ADULT MULTIVITAMIN W/MINERALS CH
1.0000 | ORAL_TABLET | Freq: Every day | ORAL | Status: DC
  Administered 2011-11-15 – 2011-11-17 (×3): 1 via ORAL
  Filled 2011-11-15 (×3): qty 1

## 2011-11-15 MED ORDER — METHYLPHENIDATE HCL 5 MG PO TABS
2.5000 mg | ORAL_TABLET | Freq: Every morning | ORAL | Status: DC
  Administered 2011-11-16: 2.5 mg via ORAL
  Administered 2011-11-17: 5 mg via ORAL
  Filled 2011-11-15 (×2): qty 1

## 2011-11-15 MED ORDER — WARFARIN SODIUM 6 MG PO TABS
6.0000 mg | ORAL_TABLET | ORAL | Status: AC
  Administered 2011-11-15: 6 mg via ORAL
  Filled 2011-11-15 (×2): qty 1

## 2011-11-15 MED ORDER — DILTIAZEM HCL ER 240 MG PO CP24
240.0000 mg | ORAL_CAPSULE | Freq: Every morning | ORAL | Status: DC
  Administered 2011-11-16 – 2011-11-17 (×2): 240 mg via ORAL
  Filled 2011-11-15 (×2): qty 1

## 2011-11-15 MED ORDER — POLYETHYLENE GLYCOL 3350 17 GM/SCOOP PO POWD
17.0000 g | Freq: Every morning | ORAL | Status: DC
  Filled 2011-11-15: qty 255

## 2011-11-15 MED ORDER — SODIUM CHLORIDE 0.9 % IV SOLN
500.0000 mg | Freq: Once | INTRAVENOUS | Status: AC
  Administered 2011-11-15: 500 mg via INTRAVENOUS
  Filled 2011-11-15 (×2): qty 5

## 2011-11-15 MED ORDER — SODIUM CHLORIDE 0.9 % IV SOLN
INTRAVENOUS | Status: DC
  Administered 2011-11-15: 21:00:00 via INTRAVENOUS

## 2011-11-15 MED ORDER — POLYETHYLENE GLYCOL 3350 17 G PO PACK
17.0000 g | PACK | Freq: Every day | ORAL | Status: DC
  Administered 2011-11-16 – 2011-11-17 (×2): 17 g via ORAL
  Filled 2011-11-15 (×2): qty 1

## 2011-11-15 MED ORDER — LEVOTHYROXINE SODIUM 50 MCG PO TABS
50.0000 ug | ORAL_TABLET | Freq: Every day | ORAL | Status: DC
  Administered 2011-11-16 – 2011-11-17 (×2): 50 ug via ORAL
  Filled 2011-11-15 (×3): qty 1

## 2011-11-15 NOTE — Progress Notes (Signed)
md notified pt has history of dysphagia.  Md is writing new orders. Wants pt to have bedside swallow evaluation by rn.  If pt passes swallow eval pt to have a diet order and will be taking po meds per md dr Rito Ehrlich.

## 2011-11-15 NOTE — ED Notes (Signed)
[id number] Ready 

## 2011-11-15 NOTE — Consult Note (Signed)
Referring Physician: Lynelle Doctor    Chief Complaint: Syncope  HPI: [name] is an 78 y.o. female who was doing dishes with her husband.  Was noted to have a snycopal episode.  Patient is amnestic of the event.  On further conversation with husband the patient was noted to have some head nodding prior to the syncopal event which is unusual for her.  On exam after the event the patient had eye deviation to the right and left facial droop with worsening left sided weakness.  The patient does have a residual left hemiparesis from an old stroke but was ambulatory.  Patient's initial NIHSS of 5.    LSN: [id number] tPA Given: No: Elevated INR mRankin:  Past Medical History  Diagnosis Date  . Cerebral infarction   . Dysphagia   . Atrial fibrillation   . Hyperlipidemia   . HTN (hypertension)     No past surgical history on file.  No family history on file. Social History:  reports that she has quit smoking. She does not have any smokeless tobacco history on file. Her alcohol and drug histories not on file.  Allergies: No Known Allergies  Medications: I have reviewed the patient's current medications. Prior to Admission:  Calcium carbonate, Diltiazem, Docusate, Synthroid, MVI, Fibercon, Coumadin  ROS: History obtained from the patient  General ROS: negative for - chills, fatigue, fever, night sweats, weight gain or weight loss Psychological ROS: negative for - behavioral disorder, hallucinations, memory difficulties, mood swings or suicidal ideation Ophthalmic ROS: negative for - blurry vision, double vision, eye pain or loss of vision ENT ROS: negative for - epistaxis, nasal discharge, oral lesions, sore throat, tinnitus or vertigo Allergy and Immunology ROS: negative for - hives or itchy/watery eyes Hematological and Lymphatic ROS: negative for - bleeding problems, bruising or swollen lymph nodes Endocrine ROS: negative for - galactorrhea, hair pattern changes, polydipsia/polyuria or  temperature intolerance Respiratory ROS: negative for - cough, hemoptysis, shortness of breath or wheezing Cardiovascular ROS: negative for - chest pain, dyspnea on exertion, edema or irregular heartbeat Gastrointestinal ROS: negative for - abdominal pain, diarrhea, hematemesis, nausea/vomiting or stool incontinence Genito-Urinary ROS: negative for - dysuria, hematuria, incontinence or urinary frequency/urgency Musculoskeletal ROS: negative for - joint swelling or muscular weakness Neurological ROS: as noted in HPI Dermatological ROS: negative for rash and skin lesion changes  Physical Examination: Blood pressure 147/84, pulse 116, temperature 97.5 F (36.4 C), temperature source Oral, resp. rate 15, SpO2 100.00%.  Neurologic Examination: Mental Status: Alert, oriented, thought content appropriate.  Speech fluent without evidence of aphasia but dysarthric.  Able to follow 3 step commands without difficulty.  Left neglect. Cranial Nerves: II: visual fields grossly normal, pupils equal, round, reactive to light and accommodation III,IV, VI: ptosis not present, extra-ocular motions intact bilaterally V,VII: left facial droop, facial light touch sensation normal bilaterally VIII: hearing normal bilaterally IX,X: gag reflex present XI: trapezius strength/neck flexion strength normal bilaterally XII: tongue strength normal  Motor: Right : Upper extremity   5/5    Left:     Upper extremity   4+/5  Lower extremity   5/5     Lower extremity   4+/5 Tone and bulk:normal tone throughout; no atrophy noted.  Muscle contractions that are rhythmic in the LLE intermittently, at times accompanied by LUE muscle contractions. Sensory: Pinprick and light touch decreased on the left Deep Tendon Reflexes: 2+ and symmetric in the upper extremities and at the right knee, 3+ left KJ, 4+ left AJ with  2-3 beats of clonus.  Absent right AJ.   Plantars: Right: upgoing   Left: upgoing Cerebellar: normal  finger-to-nose and normal heel-to-shin test   Results for orders placed during the hospital encounter of 11/15/11 (from the past 48 hour(s))  CBC     Status: Normal   Collection Time   11/15/11  1:20 PM      Component Value Range Comment   WBC 8.1  4.0 - 10.5 (K/uL)    RBC 4.38  3.87 - 5.11 (MIL/uL)    Hemoglobin 13.6  12.0 - 15.0 (g/dL)    HCT 40.9  81.1 - 91.4 (%)    MCV 89.3  78.0 - 100.0 (fL)    MCH 31.1  26.0 - 34.0 (pg)    MCHC 34.8  30.0 - 36.0 (g/dL)    RDW 78.2  95.6 - 21.3 (%)    Platelets 174  150 - 400 (K/uL)   PROTIME-INR     Status: Abnormal   Collection Time   11/15/11  1:20 PM      Component Value Range Comment   Prothrombin Time 22.1 (*) 11.6 - 15.2 (seconds)    INR 1.90 (*) 0.00 - 1.49    APTT     Status: Normal   Collection Time   11/15/11  1:20 PM      Component Value Range Comment   aPTT 31  24 - 37 (seconds)   DIFFERENTIAL     Status: Normal   Collection Time   11/15/11  1:20 PM      Component Value Range Comment   Neutrophils Relative 47  43 - 77 (%)    Neutro Abs 3.8  1.7 - 7.7 (K/uL)    Lymphocytes Relative 41  12 - 46 (%)    Lymphs Abs 3.3  0.7 - 4.0 (K/uL)    Monocytes Relative 10  3 - 12 (%)    Monocytes Absolute 0.8  0.1 - 1.0 (K/uL)    Eosinophils Relative 2  0 - 5 (%)    Eosinophils Absolute 0.2  0.0 - 0.7 (K/uL)    Basophils Relative 0  0 - 1 (%)    Basophils Absolute 0.0  0.0 - 0.1 (K/uL)   POCT I-STAT, CHEM 8     Status: Abnormal   Collection Time   11/15/11  1:33 PM      Component Value Range Comment   Sodium 138  135 - 145 (mEq/L)    Potassium 3.8  3.5 - 5.1 (mEq/L)    Chloride 106  96 - 112 (mEq/L)    BUN 22  6 - 23 (mg/dL)    Creatinine, Ser 0.86  0.50 - 1.10 (mg/dL)    Glucose, Bld 578 (*) 70 - 99 (mg/dL)    Calcium, Ion 4.69  1.12 - 1.32 (mmol/L)    TCO2 22  0 - 100 (mmol/L)    Hemoglobin 14.3  12.0 - 15.0 (g/dL)    HCT 62.9  52.8 - 41.3 (%)   GLUCOSE, CAPILLARY     Status: Abnormal   Collection Time   11/15/11  1:52 PM       Component Value Range Comment   Glucose-Capillary 109 (*) 70 - 99 (mg/dL)    Ct Head Wo Contrast  11/15/2011  *RADIOLOGY REPORT*  Clinical Data: Code stroke, left facial droop, recent fall while on Coumadin  CT HEAD WITHOUT CONTRAST  Technique:  Contiguous axial images were obtained from the base of the skull through the vertex without  contrast.  Comparison: CT brain of 05/25/2011  Findings: The ventricular system remains somewhat prominent as are the cortical sulci and the septum is in a normal midline position. An old right MCA distribution infarct is noted and appears stable. No acute infarct, hemorrhage, or mass lesion is seen.  No acute bony abnormality is seen.  The paranasal sinuses are clear.  IMPRESSION: Old right MCA infarct.  Atrophy and small vessel disease.  No acute intracranial abnormality.  Critical Value/emergent results were called by telephone at the time of interpretation on 11/15/2011  at 1:36 p.m.  to  Dr. Manus Gunning in the emergency department, who verbally acknowledged these results.  Original Report Authenticated By: Juline Patch, M.D.    Assessment: 78 y.o. female presenting with left sided weakness and right eye deviation.  Due to presentation concerned about another ischemic event with eye deviation in the opposite direction of paresis but exam shows muscle contraction that is reminiscent of seizure activity.  Not a candidate for tPA due to Coumadin use and elevated INR.  Will cover for seizure as well.    Stroke Risk Factors - atrial fibrillation, hyperlipidemia, hypertension and history of stroke in the past  Plan: 1. HgbA1c, fasting lipid panel 2. MRI, MRA  of the brain without contrast 3. PT consult, OT consult, Speech consult 4. Echocardiogram and carotid dopplers done with work up in September and do not need repeating at this time.   5. Keppra 500mg  IV load with maintenance of 500mg  BID 6. Prophylactic therapy-Anticoagulation: Coumadin to be continued with a target  INR of 2-3.   7. Risk factor modification 8. Telemetry monitoring 9. EEG   Thana Farr, MD [facility] [phone] 11/15/2011, 2:04 PM

## 2011-11-15 NOTE — ED Notes (Signed)
Family at bedside.  Spouse 

## 2011-11-15 NOTE — H&P (Addendum)
PCP:   [name], MD, MD   Chief Complaint:  Fall  HPI: Patient is a 78 year old white female with past medical history of a stroke about 5 months ago who presented to the emergency room today with loss of consciousness and a possible seizure. The patient was at home at her kitchen and this was witnessed by her husband when she suddenly started staring off into space and was not responding to his voice and then all of a sudden fell on the floor. The husband witnessed some head jerking movements and EMS was called. There is no seizure activity witnessed by EMS although the patient was noted to have some biting of the lower lip. In the emergency room, the initial CT scan of the head was negative. She was evaluated as a code stroke and neuro hospitalists recommended further evaluation with MRI and admission. Hospitals were called for evaluation. Patient underwent an MRI which several hours later was noted to have an acute infarct a small foci in the white matter near her previous right MCA distribution.  Review of Systems:  When I saw the patient in the emergency room, she complained of a mild headache. Chest no recollection of the events that happened at home. She denied any vision changes, dysphasia, chest pain, palpitations, shortness of breath, wheeze, cough, abdominal pain, hematuria, dysuria, constipation, diarrhea, focal extremity numbness weakness or pain other than some chronic left upper extremity weakness and numbness brought on by her previous stroke but she is getting physical therapy from and says that it is improving. Her review systems otherwise negative  Past Medical History: Past Medical History  Diagnosis Date  . Cerebral infarction with previous history of right upper extremity weakness    . Dysphagia   . Atrial fibrillation   . Hyperlipidemia   . HTN (hypertension)     Medications: Prior to Admission medications   Medication Sig Start Date End Date Taking?  Authorizing Provider  Calcium Carbonate (CALTRATE 600 PO) Take 1 tablet by mouth daily at 12 noon.    Yes Historical Provider, MD  diltiazem (DILACOR XR) 240 MG 24 hr capsule Take 240 mg by mouth every morning.    Yes Historical Provider, MD  Docusate Calcium (STOOL SOFTENER PO) Take 1 capsule by mouth at bedtime.    Yes Historical Provider, MD  levothyroxine (SYNTHROID, LEVOTHROID) 50 MCG tablet Take 50 mcg by mouth every morning.    Yes Historical Provider, MD  methylphenidate (RITALIN) 5 MG tablet Take 2.5 mg by mouth every morning.   Yes Historical Provider, MD  Multiple Vitamin (MULITIVITAMIN WITH MINERALS) TABS Take 1 tablet by mouth daily at 12 noon.   Yes Historical Provider, MD  polycarbophil (FIBERCON) 625 MG tablet Take 625 mg by mouth daily at 12 noon.   Yes Historical Provider, MD  polyethylene glycol powder (GLYCOLAX/MIRALAX) powder Take 17 g by mouth every morning.   Yes Historical Provider, MD  warfarin (COUMADIN) 5 MG tablet Take 5 mg by mouth every evening. Takes daily at 1700   Yes Historical Provider, MD    Allergies:  No Known Allergies  Social History:  reports that she has quit smoking. She does not have any smokeless tobacco history on file. Her alcohol and drug histories not on file. The patient is normally at baseline able to participate in most activities of daily living without assistance. She was at home with her husband. She is currently undergoing physical therapy for increased strength in her right upper extremity.  Family  History: Hypertension  Physical Exam: Filed Vitals:   11/15/11 1538 11/15/11 1545 11/15/11 1707 11/15/11 1800  BP: 127/84 126/74  131/76  Pulse: 116 100  96  Temp:   98.4 F (36.9 C) 98.3 F (36.8 C)  TempSrc:    Oral  Resp: 14 15  18   SpO2: 99% 100%  98%   General: Alert and oriented x3, no acute distress HEENT: Normocephalic some mild bite mark of the lower lip. She has a minimal left-sided facial droop although her husband  states that has been there for a while. The patient actually says it she's not ever noticed it before. Cranial nerves looked to be otherwise intact Cardiovascular: Regular rate and rhythm, S1-S2 Lungs: Clear to auscultation bilaterally Abdomen: Soft, nontender, nondistended, positive bowel sounds Extremities: No clubbing or cyanosis or edema Neurological: Patient has some left-sided weakness about a 4+/5 as compared to the right which looks to be a 5/5 for grip, flexion and extension. Lower extremities looked to be equal focal about a 5 minus/5 for flexion and extension. No other focal neurological deficits noted  Labs on Admission:   [facility] 11/15/11 1333 11/15/11 1320  NA 138 137  K 3.8 3.6  CL 106 102  CO2 -- 21  GLUCOSE 121* 118*  BUN 22 20  CREATININE 1.00 0.88  CALCIUM -- 10.1  MG -- --  PHOS -- --    Basename 11/15/11 1333 11/15/11 1320  WBC -- 8.1  NEUTROABS -- 3.8  HGB 14.3 13.6  HCT 42.0 39.1  MCV -- 89.3  PLT -- 174    Basename 11/15/11 1320  CKTOTAL 54  CKMB 2.2  CKMBINDEX --  TROPONINI <0.30    Radiological Exams on Admission: Ct Head Wo Contrast  11/15/2011 IMPRESSION: Old right MCA infarct.  Atrophy and small vessel disease.  No acute intracranial abnormality.  Ct Cervical Spine Wo Contrast 11/15/2011   IMPRESSION:  1.  Normal alignment with degenerative change particularly at C5-6 and C6-7. 2.  No acute fracture.    Mr Angiogram Head Wo Contrast 11/15/2011  IMPRESSION: There is no acute proximal occlusion or flow-limiting stenosis. Reduced right MCA trifurcation vessels is a chronic finding.    Mr Brain Wo Contrast 11/15/2011    MRI HEAD WITHOUT CONTRAST  IMPRESSION: Adjacent to the large area of remote right MCA infarction, small foci of acute ischemia are identified in the subcortical white matter  Chronic atrophy and small vessel disease.    Dg Chest Portable 1 View 11/15/2011   IMPRESSION: COPD.  Cardiomegaly.  No active lung  disease.   Assessment/Plan Present on Admission:  .Atrial fibrillation: Subtherapeutic on her Coumadin which likely contributed to her new stroke. Given increased dose and have asked pharmacy to help dose.  .Fall: Likely from her acute stroke. Physical therapy see.  Marland KitchenHypothyroidism: Continue Synthroid  .HTN (hypertension): Will watch carefully. Continue home meds.  .CVA (cerebral infarction): See above. Likely from atrial fibrillation although overweight neurology's followup tomorrow.  Dysphasia: Patient has history of previous dysphasia from her last stroke. She's been noncompliant with her diet although she does not report any symptoms. She passed a nursing swallow eval and will have speech therapy check a evaluation as well.  ? COPD: No formal diagnosis although she is a previous history of tobacco abuse and chest x-ray shows signs consistent with this. Likely will need a when necessary inhaler on discharge.  After discussion with the patient, she is to be a  Full code.  We will respect  these wishes.  I anticipate her length of stay to be 2 days based on history, exam and lab findings, unless something should change.  Time spent on this patient including examination and decision-making process: 45 minutes.  Hollice Espy [phone] 11/15/2011, 10:48 PM

## 2011-11-15 NOTE — ED Notes (Signed)
Pt requesting to go home.

## 2011-11-15 NOTE — ED Notes (Signed)
Returned from CT scan. Family at bedside.

## 2011-11-15 NOTE — Code Documentation (Signed)
78 yo female who was at home with husband putting dishes away from dishwasher, when at 1235 he noted the patients head jerking and she collapsed in the floor, striking her head on the cabinet.  He called 9-1-1 and paramedics found the pt with a Right gaze, L facial droop, and not speaking. She is on coumadin for atrial fibrillation and had a stroke in September 2012, right brain. They activated code stroke at 1309.  Pt arrived at 1318; stroke team was here at 1316. EDP exam at 1319.  She was cleared for CT and arrived there at 1320. CT was negative for acute abnormality. NIHSS was 5 (see documentation). Pt becoming more interactive. Seizure being considered. There was no urinary incontinence.  She does not recall the event, but asking what happened and very appropriate. INR=1.9. Code stroke canceled at 1352.

## 2011-11-15 NOTE — ED Notes (Signed)
Pt to ED via [facility], per spouse he and pt were putting away dishes when he noticed her slumped over sink, pt fell hitting her head on counter then falling onto kitchen floor, upon ems arrival pt was conscious w/eyes open but non-verbal, 20 g LAC, EKG, pt on LSB, c-collar, and head blocks. Pt had a stroke 09/12 w/(L) side deficits, pt presents to w/(L) side facial droop, (R) gaze, and non-verbal.

## 2011-11-15 NOTE — ED Provider Notes (Signed)
Medical screening examination/treatment/procedure(s) were conducted as a shared visit with non-physician practitioner(s) and myself.  I personally evaluated the patient during the encounter Devoria Albe, MD, Franz Dell, MD 11/15/11 [phone]

## 2011-11-15 NOTE — ED Provider Notes (Signed)
Prior husband patient had a stroke on September 1 with residual weakness in her left arm. He relates she's currently getting physical therapy. He relates about 12:30 they were washing dishes and she was standing at the sink washing the dishes and he has been on a chair putting them away. All of a sudden he noted she did not pass a Cobb and he looked over she was staring and then she fell forward hitting her head. She then fell to the floor. His relates she had some jerking of her head. Patient does not recall the event. He also states that she was having some gasping respirations. He states she's never done this before. He states he has had syncopal episodes before her period patient only complains of pain to the back of her head and in her neck.  Patient presents via EMS with backboard in C-spine percussions. Her head was palpated she's only tender the posterior head. C-collar is in place. Her clavicles are nontender, her chest is nontender to palpation, her abdomen is soft and nontender without masses. She has no shortness of breath, lungs are clear. Her pelvis is nontender she has good range of motion of all her extremities without deformity. Her neuro exam has no cranial nerve deficits and no gross motor deficit. She is a faint redness to her left cheek where she hit her head. There is no lacerations or abrasions seen.  Patient was evaluated Dr. [name] with the stroke team who felt she was not a candidate for TPA. She did recommend patient be started on Keppra IV and admitted for further evaluation including EEG.  Results for orders placed during the hospital encounter of 11/15/11  CBC      Component Value Range   WBC 8.1  4.0 - 10.5 (K/uL)   RBC 4.38  3.87 - 5.11 (MIL/uL)   Hemoglobin 13.6  12.0 - 15.0 (g/dL)   HCT 91.4  78.2 - 95.6 (%)   MCV 89.3  78.0 - 100.0 (fL)   MCH 31.1  26.0 - 34.0 (pg)   MCHC 34.8  30.0 - 36.0 (g/dL)   RDW 21.3  08.6 - 57.8 (%)   Platelets 174  150 - 400 (K/uL)    BASIC METABOLIC PANEL      Component Value Range   Sodium 137  135 - 145 (mEq/L)   Potassium 3.6  3.5 - 5.1 (mEq/L)   Chloride 102  96 - 112 (mEq/L)   CO2 21  19 - 32 (mEq/L)   Glucose, Bld 118 (*) 70 - 99 (mg/dL)   BUN 20  6 - 23 (mg/dL)   Creatinine, Ser 4.69  0.50 - 1.10 (mg/dL)   Calcium 62.9  8.4 - 10.5 (mg/dL)   GFR calc non Af Amer 61 (*) >90 (mL/min)   GFR calc Af Amer 71 (*) >90 (mL/min)  PROTIME-INR      Component Value Range   Prothrombin Time 22.1 (*) 11.6 - 15.2 (seconds)   INR 1.90 (*) 0.00 - 1.49   APTT      Component Value Range   aPTT 31  24 - 37 (seconds)  CARDIAC PANEL(CRET KIN+CKTOT+MB+TROPI)      Component Value Range   Total CK 54  7 - 177 (U/L)   CK, MB 2.2  0.3 - 4.0 (ng/mL)   Troponin I <0.30  <0.30 (ng/mL)   Relative Index RELATIVE INDEX IS INVALID  0.0 - 2.5   DIFFERENTIAL      Component Value Range  Neutrophils Relative 47  43 - 77 (%)   Neutro Abs 3.8  1.7 - 7.7 (K/uL)   Lymphocytes Relative 41  12 - 46 (%)   Lymphs Abs 3.3  0.7 - 4.0 (K/uL)   Monocytes Relative 10  3 - 12 (%)   Monocytes Absolute 0.8  0.1 - 1.0 (K/uL)   Eosinophils Relative 2  0 - 5 (%)   Eosinophils Absolute 0.2  0.0 - 0.7 (K/uL)   Basophils Relative 0  0 - 1 (%)   Basophils Absolute 0.0  0.0 - 0.1 (K/uL)  POCT I-STAT, CHEM 8      Component Value Range   Sodium 138  135 - 145 (mEq/L)   Potassium 3.8  3.5 - 5.1 (mEq/L)   Chloride 106  96 - 112 (mEq/L)   BUN 22  6 - 23 (mg/dL)   Creatinine, Ser 0.45  0.50 - 1.10 (mg/dL)   Glucose, Bld 409 (*) 70 - 99 (mg/dL)   Calcium, Ion 8.11  9.14 - 1.32 (mmol/L)   TCO2 22  0 - 100 (mmol/L)   Hemoglobin 14.3  12.0 - 15.0 (g/dL)   HCT 78.2  95.6 - 21.3 (%)  GLUCOSE, CAPILLARY      Component Value Range   Glucose-Capillary 109 (*) 70 - 99 (mg/dL)   Laboratory interpretation all normal except subtherapeutic INR   Ct Head Wo Contrast  11/15/2011  *RADIOLOGY REPORT*  Clinical Data: Code stroke, left facial droop, recent fall while  on Coumadin  CT HEAD WITHOUT CONTRAST  Technique:  Contiguous axial images were obtained from the base of the skull through the vertex without contrast.  Comparison: CT brain of 05/25/2011  Findings: The ventricular system remains somewhat prominent as are the cortical sulci and the septum is in a normal midline position. An old right MCA distribution infarct is noted and appears stable. No acute infarct, hemorrhage, or mass lesion is seen.  No acute bony abnormality is seen.  The paranasal sinuses are clear.  IMPRESSION: Old right MCA infarct.  Atrophy and small vessel disease.  No acute intracranial abnormality.  Critical Value/emergent results were called by telephone at the time of interpretation on 11/15/2011  at 1:36 p.m.  to  Dr. Manus Gunning in the emergency department, who verbally acknowledged these results.  Original Report Authenticated By: Juline Patch, M.D.   Ct Cervical Spine Wo Contrast  11/15/2011  *RADIOLOGY REPORT*  Clinical Data: Larey Seat hitting head, loss of consciousness, dizziness  CT CERVICAL SPINE WITHOUT CONTRAST  Technique:  Multidetector CT imaging of the cervical spine was performed. Multiplanar CT image reconstructions were also generated.  Comparison: None.  Findings: The cervical vertebrae are in normal alignment.  There are diffuse degenerative changes, greatest at C5-6 and C6-7 levels with loss of disc space and spurring.  The odontoid process is intact.  No cervical spine fracture is seen.  No prevertebral soft tissue swelling is noted.  There is some central canal narrowing at C5-6 and C6-7 levels.  Biapical pleuroparenchymal scarring is noted.  IMPRESSION:  1.  Normal alignment with degenerative change particularly at C5-6 and C6-7. 2.  No acute fracture.  Original Report Authenticated By: Juline Patch, M.D.   Dg Chest Portable 1 View  11/15/2011  *RADIOLOGY REPORT*  Clinical Data: Larey Seat hitting head, slurred speech  PORTABLE CHEST - 1 VIEW  Comparison: Portable chest x-ray of  05/28/2011  Findings: The lungs are clear and hyperaerated consistent with a degree of COPD.  No active process  is seen.  Cardiomegaly is stable.  No bony abnormality is noted.  IMPRESSION: COPD.  Cardiomegaly.  No active lung disease.  Original Report Authenticated By: Juline Patch, M.D.   EKG interpreted by resident and I agree with her interpretation, reviewed EKG together.   Medications  0.9 %  sodium chloride infusion (  Intravenous New Bag/Given 11/15/11 1503)  levETIRAcetam (KEPPRA) 500 mg in sodium chloride 0.9 % 100 mL IVPB (500 mg Intravenous Given 11/15/11 1503)   Diagnoses that have been ruled out:  None  Diagnoses that are still under consideration:  None  Final diagnoses:  Syncope  Contusion of head  Seizure   Plan admission  Devoria Albe, MD, FACEP  I saw and evaluated the patient, reviewed the resident's note and I agree with the findings and plan.    Ward Givens, MD 11/15/11 [phone]

## 2011-11-15 NOTE — ED Provider Notes (Signed)
History     CSN: [id number]  Arrival date & time 11/15/11  1319   None     No chief complaint on file.   (Consider location/radiation/quality/duration/timing/severity/associated sxs/prior treatment) HPI  Past Medical History  Diagnosis Date  . Cerebral infarction   . Dysphagia   . Atrial fibrillation   . Hyperlipidemia   . HTN (hypertension)     No past surgical history on file.  No family history on file.  History  Substance Use Topics  . Smoking status: Former Games developer  . Smokeless tobacco: Not on file  . Alcohol Use: Not on file    OB History    Grav Para Term Preterm Abortions TAB SAB Ect Mult Living                  Review of Systems  Allergies  Review of patient's allergies indicates no known allergies.  Home Medications   Current Outpatient Rx  Name Route Sig Dispense Refill  . CALTRATE 600 PO Oral Take 1 tablet by mouth daily.    Marland Kitchen DILTIAZEM HCL ER 240 MG PO CP24 Oral Take 240 mg by mouth daily.    . STOOL SOFTENER PO Oral Take by mouth as needed.    Marland Kitchen LEVOTHYROXINE SODIUM 50 MCG PO TABS Oral Take 50 mcg by mouth daily.    . MULTIVITAMINS PO CAPS Oral Take 1 capsule by mouth daily.    Marland Kitchen CALCIUM POLYCARBOPHIL 625 MG PO TABS Oral Take 625 mg by mouth daily.    . WARFARIN SODIUM 5 MG PO TABS Oral Take 5 mg by mouth as directed.      There were no vitals taken for this visit.  Physical Exam  ED Course  Procedures (including critical care time)  Labs Reviewed  POCT I-STAT, CHEM 8 - Abnormal; Notable for the following:    Glucose, Bld 121 (*)    All other components within normal limits  CBC  DIFFERENTIAL  BASIC METABOLIC PANEL  PROTIME-INR  APTT  CARDIAC PANEL(CRET KIN+CKTOT+MB+TROPI)   Ct Head Wo Contrast  11/15/2011  *RADIOLOGY REPORT*  Clinical Data: Code stroke, left facial droop, recent fall while on Coumadin  CT HEAD WITHOUT CONTRAST  Technique:  Contiguous axial images were obtained from the base of the skull through the vertex  without contrast.  Comparison: CT brain of 05/25/2011  Findings: The ventricular system remains somewhat prominent as are the cortical sulci and the septum is in a normal midline position. An old right MCA distribution infarct is noted and appears stable. No acute infarct, hemorrhage, or mass lesion is seen.  No acute bony abnormality is seen.  The paranasal sinuses are clear.  IMPRESSION: Old right MCA infarct.  Atrophy and small vessel disease.  No acute intracranial abnormality.  Critical Value/emergent results were called by telephone at the time of interpretation on 11/15/2011  at 1:36 p.m.  to  Dr. Manus Gunning in the emergency department, who verbally acknowledged these results.  Original Report Authenticated By: Juline Patch, M.D.     No diagnosis found.  1:43 PM Patient taken off backboard by myself and two nurses. Patient did not exhibit any spine or back tenderness. Cannot clear c-spine without imaging per NEXUS and code stroke. Dr. [name] and stroke team are in room evaluating patient.   MDM  Stroke work-up in progress.         Eustace Moore [place], [place] 11/15/11 1345

## 2011-11-15 NOTE — Progress Notes (Signed)
Pt passed swallow evaluation at bedside. Rn informed pt of the possibility of silent aspiration and resulting possibility of pneumonia. Pt wants to have diet ordered as md wrote for and is willing to take the risk.

## 2011-11-15 NOTE — ED Notes (Signed)
Pt presents to ED via [facility], per spouse  approx [phone] pt suddenly slumped over the sink fell back hitting her head on the counter top and then falling into the floor, pt was unresponsive initially after fall, pt had a stroke 9/12 w/(L) arm deficits. Pt developed (L) leg tremors while being cleared from LSB, pt LSN [id number]

## 2011-11-15 NOTE — ED Notes (Signed)
This patient feels she is at risk for a fall.  Refuses orthostatics at this time.

## 2011-11-15 NOTE — ED Notes (Signed)
Pt presents to ED via [facility], pt's spouse called d/t pt slumped over kitchen

## 2011-11-15 NOTE — ED Provider Notes (Signed)
See prior note   Ward Givens, MD 11/15/11 [phone]

## 2011-11-15 NOTE — ED Provider Notes (Signed)
History     CSN: [id number]  Arrival date & time 11/15/11  1319   First MD Initiated Contact with Patient 11/15/11 1417      Chief Complaint  Patient presents with  . Code Stroke    (Consider location/radiation/quality/duration/timing/severity/associated sxs/prior treatment) HPI  Patient is a 78 year old woman with history of known MCA stroke in Sept 2012 who presents with sudden loss of consciousness. Patient does not have memory of the event. Patient's husband reports that she was washing dishes and passing them to him to put away when she suddenly stopped passing dishes. He noticed that her head bobbed towards her chest a few times and she hit her head on the cabinet before falling to the floor. He was unable to wake her up but he was able to hear deep breathing. He denies any jerking movements of her body or vocalizations although she had repetitive head turns toward the left during this episode. No loss of bowel or bladder control. Patient reports that she feels like she bit the inside of her cheek. Patient reports mild posterior neck pain. Denies any numbness, tingling, change in vision. She has had about 6 prior episodes in the last 2 years when she has fainted but the patient and her husband report that these episodes were different since she normally experiences dizziness before these episodes and she has never had repetitive motions while on the ground. She has no prior history of seizure. Denies any new medications in the past two weeks.  Past Medical History  Diagnosis Date  . Cerebral infarction   . Dysphagia   . Atrial fibrillation   . Hyperlipidemia   . HTN (hypertension)     No past surgical history on file.  No family history on file.  History  Substance Use Topics  . Smoking status: Former Games developer  . Smokeless tobacco: Not on file  . Alcohol Use: Not on file    OB History    Grav Para Term Preterm Abortions TAB SAB Ect Mult Living                  Review  of Systems  Allergies  Review of patient's allergies indicates no known allergies.  Home Medications   Current Outpatient Rx  Name Route Sig Dispense Refill  . CALTRATE 600 PO Oral Take 1 tablet by mouth daily.    Marland Kitchen DILTIAZEM HCL ER 240 MG PO CP24 Oral Take 240 mg by mouth daily.    . STOOL SOFTENER PO Oral Take by mouth as needed.    Marland Kitchen LEVOTHYROXINE SODIUM 50 MCG PO TABS Oral Take 50 mcg by mouth daily.    . MULTIVITAMINS PO CAPS Oral Take 1 capsule by mouth daily.    Marland Kitchen CALCIUM POLYCARBOPHIL 625 MG PO TABS Oral Take 625 mg by mouth daily.    . WARFARIN SODIUM 5 MG PO TABS Oral Take 5 mg by mouth as directed.      BP 127/84  Pulse 119  Temp(Src) 97.5 F (36.4 C) (Oral)  Resp 16  SpO2 100%  Physical Exam General: thin elderly woman resting in bed in no acute distress, C-collar in place HEENT: PERRL, EOMI, no scleral icterus Cardiac: irregular rate and rhythm, no rubs, murmurs or gallops Pulm: clear to auscultation bilaterally, moving normal volumes of air Abd: soft, nontender, nondistended, BS present Ext: warm and well perfused, no pedal edema, compression stockings present Neuro: alert and oriented X3, cranial nerves II-XII intact, strength full on right  side and 4+/5 on left side. Upper extremity is notably weaker than right, in lower extremities, difference is more subtle.  ED Course  Procedures (including critical care time)  Labs Reviewed  BASIC METABOLIC PANEL - Abnormal; Notable for the following:    Glucose, Bld 118 (*)    GFR calc non Af Amer 61 (*)    GFR calc Af Amer 71 (*)    All other components within normal limits  PROTIME-INR - Abnormal; Notable for the following:    Prothrombin Time 22.1 (*)    INR 1.90 (*)    All other components within normal limits  POCT I-STAT, CHEM 8 - Abnormal; Notable for the following:    Glucose, Bld 121 (*)    All other components within normal limits  GLUCOSE, CAPILLARY - Abnormal; Notable for the following:     Glucose-Capillary 109 (*)    All other components within normal limits  CBC  APTT  CARDIAC PANEL(CRET KIN+CKTOT+MB+TROPI)  DIFFERENTIAL   Ct Head Wo Contrast  11/15/2011  *RADIOLOGY REPORT*  Clinical Data: Code stroke, left facial droop, recent fall while on Coumadin  CT HEAD WITHOUT CONTRAST  Technique:  Contiguous axial images were obtained from the base of the skull through the vertex without contrast.  Comparison: CT brain of 05/25/2011  Findings: The ventricular system remains somewhat prominent as are the cortical sulci and the septum is in a normal midline position. An old right MCA distribution infarct is noted and appears stable. No acute infarct, hemorrhage, or mass lesion is seen.  No acute bony abnormality is seen.  The paranasal sinuses are clear.  IMPRESSION: Old right MCA infarct.  Atrophy and small vessel disease.  No acute intracranial abnormality.  Critical Value/emergent results were called by telephone at the time of interpretation on 11/15/2011  at 1:36 p.m.  to  Dr. Manus Gunning in the emergency department, who verbally acknowledged these results.  Original Report Authenticated By: Juline Patch, M.D.   Dg Chest Portable 1 View  11/15/2011  *RADIOLOGY REPORT*  Clinical Data: Larey Seat hitting head, slurred speech  PORTABLE CHEST - 1 VIEW  Comparison: Portable chest x-ray of 05/28/2011  Findings: The lungs are clear and hyperaerated consistent with a degree of COPD.  No active process is seen.  Cardiomegaly is stable.  No bony abnormality is noted.  IMPRESSION: COPD.  Cardiomegaly.  No active lung disease.  Original Report Authenticated By: Juline Patch, M.D.    Date: 11/15/2011  Rate: 113  Rhythm: atrial fibrillation  QRS Axis: normal  Intervals: normal, no PR interval  ST/T Wave abnormalities: normal  Conduction Disutrbances:none  Narrative Interpretation:   Old EKG Reviewed: unchanged from 05/22/11    No diagnosis found.    MDM  Performed CT head and neck to rule out acute  bleed or fracture. Patient was evaluated as a code stroke by Neurology and was not given TPA since she has elevated INR. Neurology recommends admission to telemetry for MRI/MRA and EEG.         Margorie John, MD 11/15/11 [phone]

## 2011-11-15 NOTE — ED Notes (Addendum)
Patient transported to MRI 

## 2011-11-15 NOTE — Progress Notes (Signed)
Pt oriented to room and settled in.  Pt informed of floor policy requiring bed alarms and staff assistance when oob. Pt hooked up to telemetry.

## 2011-11-15 NOTE — ED Notes (Signed)
Patient transported to MRI 

## 2011-11-16 ENCOUNTER — Encounter (HOSPITAL_COMMUNITY): Payer: Self-pay | Admitting: General Practice

## 2011-11-16 ENCOUNTER — Observation Stay (HOSPITAL_COMMUNITY): Payer: Medicare Other

## 2011-11-16 DIAGNOSIS — G459 Transient cerebral ischemic attack, unspecified: Secondary | ICD-10-CM

## 2011-11-16 DIAGNOSIS — I059 Rheumatic mitral valve disease, unspecified: Secondary | ICD-10-CM

## 2011-11-16 LAB — RAPID URINE DRUG SCREEN, HOSP PERFORMED
Amphetamines: NOT DETECTED
Barbiturates: NOT DETECTED
Benzodiazepines: NOT DETECTED
Cocaine: NOT DETECTED
Opiates: NOT DETECTED
Tetrahydrocannabinol: NOT DETECTED

## 2011-11-16 LAB — GLUCOSE, CAPILLARY
Glucose-Capillary: 88 mg/dL (ref 70–99)
Glucose-Capillary: 90 mg/dL (ref 70–99)
Glucose-Capillary: 102 mg/dL — ABNORMAL HIGH (ref 70–99)
Glucose-Capillary: 88 mg/dL (ref 70–99)
Glucose-Capillary: 91 mg/dL (ref 70–99)

## 2011-11-16 LAB — LIPID PANEL
Cholesterol: 213 mg/dL — ABNORMAL HIGH (ref 0–200)
Total CHOL/HDL Ratio: 2.4 RATIO
VLDL: 13 mg/dL (ref 0–40)

## 2011-11-16 LAB — PROTIME-INR
INR: 1.82 — ABNORMAL HIGH (ref 0.00–1.49)
Prothrombin Time: 21.4 seconds — ABNORMAL HIGH (ref 11.6–15.2)

## 2011-11-16 MED ORDER — WARFARIN SODIUM 7.5 MG PO TABS
7.5000 mg | ORAL_TABLET | Freq: Once | ORAL | Status: AC
  Administered 2011-11-16: 7.5 mg via ORAL
  Filled 2011-11-16: qty 1

## 2011-11-16 MED ORDER — LEVETIRACETAM 500 MG PO TABS
500.0000 mg | ORAL_TABLET | Freq: Two times a day (BID) | ORAL | Status: DC
  Administered 2011-11-16 – 2011-11-17 (×3): 500 mg via ORAL
  Filled 2011-11-16 (×4): qty 1

## 2011-11-16 MED ORDER — WARFARIN - PHARMACIST DOSING INPATIENT
Freq: Every day | Status: DC
  Filled 2011-11-16 (×2): qty 1

## 2011-11-16 MED ORDER — ATORVASTATIN CALCIUM 10 MG PO TABS: 20.0000 mg | ORAL_TABLET | Freq: Every day | ORAL | Status: DC

## 2011-11-16 MED ORDER — ATORVASTATIN CALCIUM 10 MG PO TABS
10.0000 mg | ORAL_TABLET | Freq: Every day | ORAL | Status: DC
  Administered 2011-11-16: 10 mg via ORAL
  Filled 2011-11-16 (×2): qty 1

## 2011-11-16 NOTE — Progress Notes (Signed)
Utilization review complete 

## 2011-11-16 NOTE — Progress Notes (Signed)
Stroke Team Progress Note  HISTORY [name] is an 78 y.o. female who was doing dishes with her husband. Was noted to have a snycopal episode. Patient is amnestic of the event. On further conversation with husband the patient was noted to have some head nodding prior to the syncopal event which is unusual for her. On exam after the event the patient had eye deviation to the right and left facial droop with worsening left sided weakness. The patient does have a residual left hemiparesis from an old stroke but was ambulatory. Patient's initial NIHSS of 5.      SUBJECTIVE No family is at the bedside. Overall she feels her condition is completely resolved. The patient indicates that she is bitten her tongue. The patient denies any prior history of blackouts or seizures. Patient has no recollection of events that occurred between home and the hospital.  OBJECTIVE Most recent Vital Signs: Temp: 98 F (36.7 C) (02/27 0900) Temp src: Oral (02/27 0900) BP: 98/60 mmHg (02/27 0900) Pulse Rate: 76  (02/27 0900) Respiratory Rate: 18 O2 Saturation: 97%  CBG (last 3)  Basename 11/16/11 0703 11/15/11 2331 11/15/11 1933  GLUCAP 90 88 88   Intake/Output from previous day: 02/26 0701 - 02/27 0700 In: 1491.7 [I.V.:1386.7; IV Piggyback:105] Out: -   IV Fluid Intake:     . sodium chloride 100 mL/hr at 11/15/11 1503  . sodium chloride 20 mL/hr at 11/15/11 2125   Medications    . diltiazem  240 mg Oral q morning - 10a  . docusate sodium  100 mg Oral QHS  . levetiracetam  500 mg Intravenous Once  . levothyroxine  50 mcg Oral Q0600  . methylphenidate  2.5 mg Oral q morning - 10a  . mulitivitamin with minerals  1 tablet Oral Q1200  . polyethylene glycol  17 g Oral Daily  . warfarin  6 mg Oral NOW  . DISCONTD: polyethylene glycol powder  17 g Oral q morning - 10a  PRN   Diet:  Cardiac thin liquids Activity: Bathroom privileges DVT Prophylaxis:  SCDs, Coumadin  Significant Diagnostic  Studies: CBC    Component Value Date/Time   WBC 8.1 11/15/2011 1320   RBC 4.38 11/15/2011 1320   HGB 14.3 11/15/2011 1333   HCT 42.0 11/15/2011 1333   PLT 174 11/15/2011 1320   MCV 89.3 11/15/2011 1320   MCH 31.1 11/15/2011 1320   MCHC 34.8 11/15/2011 1320   RDW 15.1 11/15/2011 1320   LYMPHSABS 3.3 11/15/2011 1320   MONOABS 0.8 11/15/2011 1320   EOSABS 0.2 11/15/2011 1320   BASOSABS 0.0 11/15/2011 1320   CMP    Component Value Date/Time   NA 138 11/15/2011 1333   K 3.8 11/15/2011 1333   CL 106 11/15/2011 1333   CO2 21 11/15/2011 1320   GLUCOSE 121* 11/15/2011 1333   BUN 22 11/15/2011 1333   CREATININE 1.00 11/15/2011 1333   CALCIUM 10.1 11/15/2011 1320   PROT 6.7 05/30/2011 0500   ALBUMIN 2.7* 05/30/2011 0500   AST 40* 05/30/2011 0500   ALT 26 05/30/2011 0500   ALKPHOS 54 05/30/2011 0500   BILITOT 0.5 05/30/2011 0500   GFRNONAA 61* 11/15/2011 1320   GFRAA 71* 11/15/2011 1320   COAGS Lab Results  Component Value Date   INR 1.90* 11/15/2011   INR 1.90* 06/13/2011   INR 2.13* 06/11/2011   Lipid Panel    Component Value Date/Time   CHOL 213* 11/16/2011 0610   TRIG 67 11/16/2011 0610  HDL 89 11/16/2011 0610   CHOLHDL 2.4 11/16/2011 0610   VLDL 13 11/16/2011 0610   LDLCALC 111* 11/16/2011 0610   HgbA1C  Lab Results  Component Value Date   HGBA1C 5.5 11/15/2011   Urine Drug Screen    Component Value Date/Time   LABOPIA NONE DETECTED 11/16/2011 0605   COCAINSCRNUR NONE DETECTED 11/16/2011 0605   LABBENZ NONE DETECTED 11/16/2011 0605   AMPHETMU NONE DETECTED 11/16/2011 0605   THCU NONE DETECTED 11/16/2011 0605   LABBARB NONE DETECTED 11/16/2011 0605    Alcohol Level No results found for this basename: eth     Results for orders placed during the hospital encounter of 11/15/11 (from the past 24 hour(s))  CBC     Status: Normal   Collection Time   11/15/11  1:20 PM      Component Value Range   WBC 8.1  4.0 - 10.5 (K/uL)   RBC 4.38  3.87 - 5.11 (MIL/uL)   Hemoglobin 13.6  12.0 - 15.0 (g/dL)    HCT 16.1  09.6 - 04.5 (%)   MCV 89.3  78.0 - 100.0 (fL)   MCH 31.1  26.0 - 34.0 (pg)   MCHC 34.8  30.0 - 36.0 (g/dL)   RDW 40.9  81.1 - 91.4 (%)   Platelets 174  150 - 400 (K/uL)  BASIC METABOLIC PANEL     Status: Abnormal   Collection Time   11/15/11  1:20 PM      Component Value Range   Sodium 137  135 - 145 (mEq/L)   Potassium 3.6  3.5 - 5.1 (mEq/L)   Chloride 102  96 - 112 (mEq/L)   CO2 21  19 - 32 (mEq/L)   Glucose, Bld 118 (*) 70 - 99 (mg/dL)   BUN 20  6 - 23 (mg/dL)   Creatinine, Ser 7.82  0.50 - 1.10 (mg/dL)   Calcium 95.6  8.4 - 10.5 (mg/dL)   GFR calc non Af Amer 61 (*) >90 (mL/min)   GFR calc Af Amer 71 (*) >90 (mL/min)  PROTIME-INR     Status: Abnormal   Collection Time   11/15/11  1:20 PM      Component Value Range   Prothrombin Time 22.1 (*) 11.6 - 15.2 (seconds)   INR 1.90 (*) 0.00 - 1.49   APTT     Status: Normal   Collection Time   11/15/11  1:20 PM      Component Value Range   aPTT 31  24 - 37 (seconds)  CARDIAC PANEL(CRET KIN+CKTOT+MB+TROPI)     Status: Normal   Collection Time   11/15/11  1:20 PM      Component Value Range   Total CK 54  7 - 177 (U/L)   CK, MB 2.2  0.3 - 4.0 (ng/mL)   Troponin I <0.30  <0.30 (ng/mL)   Relative Index RELATIVE INDEX IS INVALID  0.0 - 2.5   DIFFERENTIAL     Status: Normal   Collection Time   11/15/11  1:20 PM      Component Value Range   Neutrophils Relative 47  43 - 77 (%)   Neutro Abs 3.8  1.7 - 7.7 (K/uL)   Lymphocytes Relative 41  12 - 46 (%)   Lymphs Abs 3.3  0.7 - 4.0 (K/uL)   Monocytes Relative 10  3 - 12 (%)   Monocytes Absolute 0.8  0.1 - 1.0 (K/uL)   Eosinophils Relative 2  0 - 5 (%)   Eosinophils  Absolute 0.2  0.0 - 0.7 (K/uL)   Basophils Relative 0  0 - 1 (%)   Basophils Absolute 0.0  0.0 - 0.1 (K/uL)  POCT I-STAT, CHEM 8     Status: Abnormal   Collection Time   11/15/11  1:33 PM      Component Value Range   Sodium 138  135 - 145 (mEq/L)   Potassium 3.8  3.5 - 5.1 (mEq/L)   Chloride 106  96 - 112  (mEq/L)   BUN 22  6 - 23 (mg/dL)   Creatinine, Ser 1.61  0.50 - 1.10 (mg/dL)   Glucose, Bld 096 (*) 70 - 99 (mg/dL)   Calcium, Ion 0.45  4.09 - 1.32 (mmol/L)   TCO2 22  0 - 100 (mmol/L)   Hemoglobin 14.3  12.0 - 15.0 (g/dL)   HCT 81.1  91.4 - 78.2 (%)  GLUCOSE, CAPILLARY     Status: Abnormal   Collection Time   11/15/11  1:52 PM      Component Value Range   Glucose-Capillary 109 (*) 70 - 99 (mg/dL)  GLUCOSE, CAPILLARY     Status: Normal   Collection Time   11/15/11  7:33 PM      Component Value Range   Glucose-Capillary 88  70 - 99 (mg/dL)  HEMOGLOBIN N5A     Status: Normal   Collection Time   11/15/11  7:59 PM      Component Value Range   Hemoglobin A1C 5.5  <5.7 (%)   Mean Plasma Glucose 111  <117 (mg/dL)  GLUCOSE, CAPILLARY     Status: Normal   Collection Time   11/15/11 11:31 PM      Component Value Range   Glucose-Capillary 88  70 - 99 (mg/dL)   Comment 1 Notify RN    URINE RAPID DRUG SCREEN (HOSP PERFORMED)     Status: Normal   Collection Time   11/16/11  6:05 AM      Component Value Range   Opiates NONE DETECTED  NONE DETECTED    Cocaine NONE DETECTED  NONE DETECTED    Benzodiazepines NONE DETECTED  NONE DETECTED    Amphetamines NONE DETECTED  NONE DETECTED    Tetrahydrocannabinol NONE DETECTED  NONE DETECTED    Barbiturates NONE DETECTED  NONE DETECTED   LIPID PANEL     Status: Abnormal   Collection Time   11/16/11  6:10 AM      Component Value Range   Cholesterol 213 (*) 0 - 200 (mg/dL)   Triglycerides 67  <213 (mg/dL)   HDL 89  >08 (mg/dL)   Total CHOL/HDL Ratio 2.4     VLDL 13  0 - 40 (mg/dL)   LDL Cholesterol 657 (*) 0 - 99 (mg/dL)  GLUCOSE, CAPILLARY     Status: Normal   Collection Time   11/16/11  7:03 AM      Component Value Range   Glucose-Capillary 90  70 - 99 (mg/dL)   Comment 1 Notify RN      Ct Head Wo Contrast  11/15/2011  *RADIOLOGY REPORT*  Clinical Data: Code stroke, left facial droop, recent fall while on Coumadin  CT HEAD WITHOUT CONTRAST   Technique:  Contiguous axial images were obtained from the base of the skull through the vertex without contrast.  Comparison: CT brain of 05/25/2011  Findings: The ventricular system remains somewhat prominent as are the cortical sulci and the septum is in a normal midline position. An old right MCA distribution infarct is  noted and appears stable. No acute infarct, hemorrhage, or mass lesion is seen.  No acute bony abnormality is seen.  The paranasal sinuses are clear.  IMPRESSION: Old right MCA infarct.  Atrophy and small vessel disease.  No acute intracranial abnormality.  Critical Value/emergent results were called by telephone at the time of interpretation on 11/15/2011  at 1:36 p.m.  to  Dr. Manus Gunning in the emergency department, who verbally acknowledged these results.  Original Report Authenticated By: Juline Patch, M.D.   Ct Cervical Spine Wo Contrast  11/15/2011  *RADIOLOGY REPORT*  Clinical Data: Larey Seat hitting head, loss of consciousness, dizziness  CT CERVICAL SPINE WITHOUT CONTRAST  Technique:  Multidetector CT imaging of the cervical spine was performed. Multiplanar CT image reconstructions were also generated.  Comparison: None.  Findings: The cervical vertebrae are in normal alignment.  There are diffuse degenerative changes, greatest at C5-6 and C6-7 levels with loss of disc space and spurring.  The odontoid process is intact.  No cervical spine fracture is seen.  No prevertebral soft tissue swelling is noted.  There is some central canal narrowing at C5-6 and C6-7 levels.  Biapical pleuroparenchymal scarring is noted.  IMPRESSION:  1.  Normal alignment with degenerative change particularly at C5-6 and C6-7. 2.  No acute fracture.  Original Report Authenticated By: Juline Patch, M.D.   Mr Angiogram Head Wo Contrast  11/15/2011  *RADIOLOGY REPORT*  Clinical Data:  Code stroke.  Severe confusion.  Previous endovascular treatment for right middle cerebral artery stenosis.  MRI HEAD WITHOUT  CONTRAST MRA HEAD WITHOUT CONTRAST  Technique:  Multiplanar, multiecho pulse sequences of the brain and surrounding structures were obtained without intravenous contrast. Angiographic images of the head were obtained using MRA technique without contrast.  Comparison:  CT scans beginning 05/21/2011.  MRI HEAD  Findings:  Large remote right MCA territory infarct affects the frontal and temporal lobes as well as the insular cortex and portions of the basal ganglia, with gliosis and encephalomalacia, as well as scattered foci of chronic hemorrhage.  There are small foci of restricted diffusion in the centrum semiovale in the right posterior frontal and superior temporal regions representing new infarction in the same vascular territory, but peripheral to the area of chronically infarcted brain (image 17-18, series 3). No acute hemorrhage. No mass lesion or extra-axial fluid.  Atrophy with chronic microvascular ischemic change.  Patent carotid and basilar arteries.  Normal midline structures.  No sinus or mastoid disease.  IMPRESSION: Adjacent to the large area of remote right MCA infarction, small foci of acute ischemia are identified in the subcortical white matter  Chronic atrophy and small vessel disease.  MRA HEAD  Findings: Widely patent carotid and basilar arteries.  There is no flow limiting stenosis of the anterior, middle, or posterior cerebral arteries.  The basilar arteries widely patent with the right vertebral sole contributor.  There is no visible cerebellar branch occlusion.  There appear to be reduced number of M3 and M4 vessels on the right consistent with the observed large infarct.  T1 shortening on MRA is reflective of prior right temporal hemorrhage, not abnormal flow related enhancement  IMPRESSION: There is no acute proximal occlusion or flow-limiting stenosis. Reduced right MCA trifurcation vessels is a chronic finding.  Original Report Authenticated By: Elsie Stain, M.D.   Mr Brain Wo  Contrast  11/15/2011  *RADIOLOGY REPORT*  Clinical Data:  Code stroke.  Severe confusion.  Previous endovascular treatment for right middle cerebral artery stenosis.  MRI HEAD WITHOUT CONTRAST MRA HEAD WITHOUT CONTRAST  Technique:  Multiplanar, multiecho pulse sequences of the brain and surrounding structures were obtained without intravenous contrast. Angiographic images of the head were obtained using MRA technique without contrast.  Comparison:  CT scans beginning 05/21/2011.  MRI HEAD  Findings:  Large remote right MCA territory infarct affects the frontal and temporal lobes as well as the insular cortex and portions of the basal ganglia, with gliosis and encephalomalacia, as well as scattered foci of chronic hemorrhage.  There are small foci of restricted diffusion in the centrum semiovale in the right posterior frontal and superior temporal regions representing new infarction in the same vascular territory, but peripheral to the area of chronically infarcted brain (image 17-18, series 3). No acute hemorrhage. No mass lesion or extra-axial fluid.  Atrophy with chronic microvascular ischemic change.  Patent carotid and basilar arteries.  Normal midline structures.  No sinus or mastoid disease.  IMPRESSION: Adjacent to the large area of remote right MCA infarction, small foci of acute ischemia are identified in the subcortical white matter  Chronic atrophy and small vessel disease.  MRA HEAD  Findings: Widely patent carotid and basilar arteries.  There is no flow limiting stenosis of the anterior, middle, or posterior cerebral arteries.  The basilar arteries widely patent with the right vertebral sole contributor.  There is no visible cerebellar branch occlusion.  There appear to be reduced number of M3 and M4 vessels on the right consistent with the observed large infarct.  T1 shortening on MRA is reflective of prior right temporal hemorrhage, not abnormal flow related enhancement  IMPRESSION: There is no acute  proximal occlusion or flow-limiting stenosis. Reduced right MCA trifurcation vessels is a chronic finding.  Original Report Authenticated By: Elsie Stain, M.D.   Dg Chest Portable 1 View  11/15/2011  *RADIOLOGY REPORT*  Clinical Data: Larey Seat hitting head, slurred speech  PORTABLE CHEST - 1 VIEW  Comparison: Portable chest x-ray of 05/28/2011  Findings: The lungs are clear and hyperaerated consistent with a degree of COPD.  No active process is seen.  Cardiomegaly is stable.  No bony abnormality is noted.  IMPRESSION: COPD.  Cardiomegaly.  No active lung disease.  Original Report Authenticated By: Juline Patch, M.D.    CT of the brain  IMPRESSION:  Old right MCA infarct. Atrophy and small vessel disease. No acute  intracranial abnormality.   CT angio  not ordered   MRI of the brain   IMPRESSION:  Adjacent to the large area of remote right MCA infarction, small  foci of acute ischemia are identified in the subcortical white  matter  MRA of the brain   IMPRESSION:  There is no acute proximal occlusion or flow-limiting stenosis.  Reduced right MCA trifurcation vessels is a chronic finding.   2D Echocardiogram pending  Carotid Doppler  pending  CXR IMPRESSION:  COPD. Cardiomegaly. No active lung disease.    EKG  A. Fib, PVC's, right axis deviation, heart rate 113  Physical Exam   The patient is alert and cooperative.  Neurologic exam reveals full extraocular movements, speech is normal. Visual fields are full.  Motor testing reveals good strength of all four extremities.  The patient has good finger-nose-finger and heel-to-shin bilaterally. Gait was not tested.  Deep tendon reflexes are symmetric and normal.  No drift of the upper extremities is noted. Good facial symmetry is seen.    Toes are down going bilaterally.    ASSESSMENT Ms. April Benedict  Rollins is a 78 y.o. female with a possible right brain stroke, secondary to AFIB. On warfarin for secondary stroke  prevention.  Stroke risk factors:  atrial fibrillation, dyslipidemia, hypertension.  Hospital day # 1  TREATMENT/PLAN Continue warfarin for secondary stroke prevention. 2-D echo cardiogram, carotid Doppler study are pending.  The patient likely suffered a seizure prior to admission. The patient currently is on Keppra. The diffusion weighted positive image by MRI could in part be related to the seizure. The patient will continue on the Coumadin. EEG study is pending.    Lesly Dukes

## 2011-11-16 NOTE — Progress Notes (Signed)
Speech Language/Pathology  Swallow assessment completed.  Will document full report. Recommend:  Regular texture diet with thin liquids, no straws, carryover of swallow strategies from prior MBS (2-3 extra swallows, intermittent throat clear).  MD, please order if agree.  Breck Coons Bradley.Ed [employer] [phone]  11/16/2011

## 2011-11-16 NOTE — Progress Notes (Signed)
   CARE MANAGEMENT NOTE 11/16/2011  Patient:  [name], [name]   Account Number:  [id number]  Date Initiated:  11/16/2011  Documentation initiated by:  Donn Pierini  Subjective/Objective Assessment:   Pt admitted with CVA     Action/Plan:   PTA pt lived at home with spouse, was independent with ADLs, stroke workup in progress   Anticipated DC Date:  11/17/2011   Anticipated DC Plan:  HOME/SELF CARE      DC Planning Services  CM consult      Choice offered to / List presented to:             Status of service:  In process, will continue to follow Medicare Important Message given?   (If response is "NO", the following Medicare IM given date fields will be blank) Date Medicare IM given:   Date Additional Medicare IM given:    Discharge Disposition:    Per UR Regulation:    Comments:  PCP- [facility]  11/16/11- 1530- Donn Pierini RN, BSN [phone] Spoke with pt and husband at bedside- per conversation pt lives at home has walker but does not use it. Pt is currently doing outpt PT at [facility] outpt neuro rehab- has appointment through Mar. 14. Pt has medication benefits uses [facility] and mail order. CM to follow for d/c needs.

## 2011-11-16 NOTE — Progress Notes (Signed)
Bilateral carotid artery duplex completed.  Preliminary report is no evidence of significant ICA stenosis. 

## 2011-11-16 NOTE — Evaluation (Signed)
Clinical/Bedside Swallow Evaluation Patient Details  Name: [name] MRN: [id number] DOB: 08-29-1931 Today's Date: 11/16/2011  HPI:  78 yr old admitted with decreased responsiveness, fall.  Found to have an acute right frontal/temporal infarct.  PMH:  large old right CVA, dysphagia with 3 MBS' between 05/24/11-06/08/11.  MBS 06/08/11 revealed flash penetration with thin liquids with recommendation of DYs 2 thin liquids, double swallows and intermittent throat clear.  CXR 11/15/11 showed COPD, cardiomegaly and no active disease      Past Medical History:  Past Medical History  Diagnosis Date  . Cerebral infarction   . Dysphagia   . Atrial fibrillation   . Hyperlipidemia   . HTN (hypertension)   . Hypothyroidism   . Stroke   . Pneumonia     years ago   Past Surgical History:  Past Surgical History  Procedure Date  . Back surgery   . Abdominal hysterectomy   . Pleural scarification    Assessment/Recommendations/Treatment Plan   SLP Assessment Clinical Impression Statement: Pt.'s oral phase appeared WL's.  One mild delayed cough after cup sip water was observed out of 6 trials, however with history of dysphagia, it is not unlikely that pt will experience intermittent signs of decreased airway protection.  Pt was able to state her swallow strategies and reports she utillizes them at home although she required mild cues to perform.  Pt. nor husband report recent difficulties when eating or drinking.  Given the above observations and no active disease on CXR, recommend pt. continue a regular diet texture and thin liquids. Risk for Aspiration: Moderate Other Related Risk Factors: History of dysphagia  Swallow Evaluation Recommendations Solid Consistency: Regular Liquid Consistency: Thin Liquid Administration via: Cup;No straw Medication Administration: Whole meds with liquid Supervision: Intermittent supervision to cue for compensatory strategies Compensations: Slow rate;Small  sips/bites;Multiple dry swallows after each bite/sip;Clear throat intermittently Postural Changes and/or Swallow Maneuvers: Seated upright 90 degrees Follow up Recommendations: None  Treatment Plan Treatment Plan Recommendations: No treatment recommended at this time     Individuals Consulted Consulted and Agree with Results and Recommendations: Patient;Family member/caregiver   Swallow Study Oral Motor/Sensory Function  Overall Oral Motor/Sensory Function: Impaired at baseline Labial ROM: Reduced left Labial Symmetry: Abnormal symmetry left Labial Strength: Reduced Lingual ROM:  (IMPRECISE MOVEMENTS) Facial ROM: Reduced left Facial Symmetry: Left droop Velum: Within Functional Limits Mandible: Within Functional Limits  Consistency Results  Ice Chips Ice chips: Not tested  Thin Liquid Thin Liquid: Impaired Presentation: Cup;Self Fed Pharyngeal  Phase Impairments: Throat Clearing - Delayed (APPROX 1 MINUTE DELAYED MILD COUGH)  Nectar Thick Liquid Nectar Thick Liquid: Not tested  Honey Thick Liquid Honey Thick Liquid: Not tested  Puree Puree: Within functional limits Presentation: Self Fed;Spoon  Solid Solid: Within functional limits Presentation: Self Fed   Royce Macadamia M.Ed [employer] [phone]  11/16/2011

## 2011-11-16 NOTE — Progress Notes (Signed)
  Echocardiogram 2D Echocardiogram has been performed.  [name] Jalesa Thien 11/16/2011, 4:29 PM

## 2011-11-16 NOTE — Progress Notes (Signed)
ANTICOAGULATION CONSULT NOTE - Initial Consult  Pharmacy Consult for Coumadin Indication: atrial fibrillation/new CVA  No Known Allergies  Vital Signs: Temp: 98 F (36.7 C) (02/27 0900) Temp src: Oral (02/27 0900) BP: 98/60 mmHg (02/27 0900) Pulse Rate: 76  (02/27 0900)  Labs:  Basename 11/16/11 0930 11/15/11 1333 11/15/11 1320  HGB -- 14.3 13.6  HCT -- 42.0 39.1  PLT -- -- 174  APTT -- -- 31  LABPROT 21.4* -- 22.1*  INR 1.82* -- 1.90*  HEPARINUNFRC -- -- --  CREATININE -- 1.00 0.88  CKTOTAL -- -- 54  CKMB -- -- 2.2  TROPONINI -- -- <0.30   The CrCl is unknown because both a height and weight (above a minimum accepted value) are required for this calculation.  Medical History: Past Medical History  Diagnosis Date  . Cerebral infarction   . Dysphagia   . Atrial fibrillation   . Hyperlipidemia   . HTN (hypertension)   . Hypothyroidism   . Stroke   . Pneumonia     years ago    Assessment: 78 yo F admitted with new CVA, on Coumadin PTA for afib.  Was slightly subtherapeutic on admission (1.9).  Home dose of 5 mg daily, was given 6 mg last night per MD.  INR remains subtherapeutic today.  Will increase dose further.    Goal of Therapy:  INR 2-3   Plan:  1.  Coumadin 7.5 mg po x 1 2.  Daily PT/INR  Rolland Porter, Pharm.D., BCPS Clinical Pharmacist Pager: [phone]

## 2011-11-16 NOTE — Progress Notes (Signed)
Speech Language/Pathology  Pt. currently having EEG in room.  Will return for swallow assessment today.  Breck Coons [place].Ed [employer] [phone]  11/16/2011

## 2011-11-16 NOTE — Progress Notes (Signed)
Patient and pt's husband are dissatisfied, because seems they are just waiting, nothing is being done. Explained that other patients are having some of same tests done, as ordered, and they would do there best to get to them as soon as possible.

## 2011-11-16 NOTE — Progress Notes (Signed)
Subjective: Patient seen and examined this am.  denies any weakness. Husband at bedside.  Objective:  Vital signs in last 24 hours:  Filed Vitals:   11/15/11 2300 11/16/11 0200 11/16/11 0600 11/16/11 0900  BP: 107/67 107/69 113/71 98/60  Pulse: 81 74 69 76  Temp: 98.3 F (36.8 C) 97.8 F (36.6 C) 98.2 F (36.8 C) 98 F (36.7 C)  TempSrc: Oral Oral Oral Oral  Resp: 16 16 18 18   SpO2: 100% 96% 100% 97%    Intake/Output from previous day:   Intake/Output Summary (Last 24 hours) at 11/16/11 1426 Last data filed at 11/16/11 0700  Gross per 24 hour  Intake 1491.67 ml  Output      0 ml  Net 1491.67 ml    Physical Exam:  General:elderly female  in no acute distress. HEENT: no pallor, no icterus, moist oral mucosa, no JVD, no lymphadenopathy Heart: irregularly irregular, without murmurs, rubs, gallops. Lungs: Clear to auscultation bilaterally. Abdomen: Soft, nontender, nondistended, positive bowel sounds. Extremities: No clubbing cyanosis or edema with positive pedal pulses. Neuro: Alert, awake, oriented x3,  left arm 4/5 power, normal power in all other extremities    Lab Results:  Basic Metabolic Panel:    Component Value Date/Time   NA 138 11/15/2011 1333   K 3.8 11/15/2011 1333   CL 106 11/15/2011 1333   CO2 21 11/15/2011 1320   BUN 22 11/15/2011 1333   CREATININE 1.00 11/15/2011 1333   GLUCOSE 121* 11/15/2011 1333   CALCIUM 10.1 11/15/2011 1320   CBC:    Component Value Date/Time   WBC 8.1 11/15/2011 1320   HGB 14.3 11/15/2011 1333   HCT 42.0 11/15/2011 1333   PLT 174 11/15/2011 1320   MCV 89.3 11/15/2011 1320   NEUTROABS 3.8 11/15/2011 1320   LYMPHSABS 3.3 11/15/2011 1320   MONOABS 0.8 11/15/2011 1320   EOSABS 0.2 11/15/2011 1320   BASOSABS 0.0 11/15/2011 1320    No results found for this or any previous visit (from the past 240 hour(s)).  Studies/Results: Ct Head Wo Contrast  11/15/2011  *RADIOLOGY REPORT*  Clinical Data: Code stroke, left facial droop, recent  fall while on Coumadin  CT HEAD WITHOUT CONTRAST  Technique:  Contiguous axial images were obtained from the base of the skull through the vertex without contrast.  Comparison: CT brain of 05/25/2011  Findings: The ventricular system remains somewhat prominent as are the cortical sulci and the septum is in a normal midline position. An old right MCA distribution infarct is noted and appears stable. No acute infarct, hemorrhage, or mass lesion is seen.  No acute bony abnormality is seen.  The paranasal sinuses are clear.  IMPRESSION: Old right MCA infarct.  Atrophy and small vessel disease.  No acute intracranial abnormality.  Critical Value/emergent results were called by telephone at the time of interpretation on 11/15/2011  at 1:36 p.m.  to  Dr. Manus Gunning in the emergency department, who verbally acknowledged these results.  Original Report Authenticated By: Juline Patch, M.D.   Ct Cervical Spine Wo Contrast  11/15/2011  *RADIOLOGY REPORT*  Clinical Data: Larey Seat hitting head, loss of consciousness, dizziness  CT CERVICAL SPINE WITHOUT CONTRAST  Technique:  Multidetector CT imaging of the cervical spine was performed. Multiplanar CT image reconstructions were also generated.  Comparison: None.  Findings: The cervical vertebrae are in normal alignment.  There are diffuse degenerative changes, greatest at C5-6 and C6-7 levels with loss of disc space and spurring.  The odontoid process is  intact.  No cervical spine fracture is seen.  No prevertebral soft tissue swelling is noted.  There is some central canal narrowing at C5-6 and C6-7 levels.  Biapical pleuroparenchymal scarring is noted.  IMPRESSION:  1.  Normal alignment with degenerative change particularly at C5-6 and C6-7. 2.  No acute fracture.  Original Report Authenticated By: Juline Patch, M.D.   Mr Angiogram Head Wo Contrast  11/15/2011  *RADIOLOGY REPORT*  Clinical Data:  Code stroke.  Severe confusion.  Previous endovascular treatment for right middle  cerebral artery stenosis.  MRI HEAD WITHOUT CONTRAST MRA HEAD WITHOUT CONTRAST  Technique:  Multiplanar, multiecho pulse sequences of the brain and surrounding structures were obtained without intravenous contrast. Angiographic images of the head were obtained using MRA technique without contrast.  Comparison:  CT scans beginning 05/21/2011.  MRI HEAD  Findings:  Large remote right MCA territory infarct affects the frontal and temporal lobes as well as the insular cortex and portions of the basal ganglia, with gliosis and encephalomalacia, as well as scattered foci of chronic hemorrhage.  There are small foci of restricted diffusion in the centrum semiovale in the right posterior frontal and superior temporal regions representing new infarction in the same vascular territory, but peripheral to the area of chronically infarcted brain (image 17-18, series 3). No acute hemorrhage. No mass lesion or extra-axial fluid.  Atrophy with chronic microvascular ischemic change.  Patent carotid and basilar arteries.  Normal midline structures.  No sinus or mastoid disease.  IMPRESSION: Adjacent to the large area of remote right MCA infarction, small foci of acute ischemia are identified in the subcortical white matter  Chronic atrophy and small vessel disease.  MRA HEAD  Findings: Widely patent carotid and basilar arteries.  There is no flow limiting stenosis of the anterior, middle, or posterior cerebral arteries.  The basilar arteries widely patent with the right vertebral sole contributor.  There is no visible cerebellar branch occlusion.  There appear to be reduced number of M3 and M4 vessels on the right consistent with the observed large infarct.  T1 shortening on MRA is reflective of prior right temporal hemorrhage, not abnormal flow related enhancement  IMPRESSION: There is no acute proximal occlusion or flow-limiting stenosis. Reduced right MCA trifurcation vessels is a chronic finding.  Original Report Authenticated  By: Elsie Stain, M.D.   Mr Brain Wo Contrast  11/15/2011  *RADIOLOGY REPORT*  Clinical Data:  Code stroke.  Severe confusion.  Previous endovascular treatment for right middle cerebral artery stenosis.  MRI HEAD WITHOUT CONTRAST MRA HEAD WITHOUT CONTRAST  Technique:  Multiplanar, multiecho pulse sequences of the brain and surrounding structures were obtained without intravenous contrast. Angiographic images of the head were obtained using MRA technique without contrast.  Comparison:  CT scans beginning 05/21/2011.  MRI HEAD  Findings:  Large remote right MCA territory infarct affects the frontal and temporal lobes as well as the insular cortex and portions of the basal ganglia, with gliosis and encephalomalacia, as well as scattered foci of chronic hemorrhage.  There are small foci of restricted diffusion in the centrum semiovale in the right posterior frontal and superior temporal regions representing new infarction in the same vascular territory, but peripheral to the area of chronically infarcted brain (image 17-18, series 3). No acute hemorrhage. No mass lesion or extra-axial fluid.  Atrophy with chronic microvascular ischemic change.  Patent carotid and basilar arteries.  Normal midline structures.  No sinus or mastoid disease.  IMPRESSION: Adjacent to the large  area of remote right MCA infarction, small foci of acute ischemia are identified in the subcortical white matter  Chronic atrophy and small vessel disease.  MRA HEAD  Findings: Widely patent carotid and basilar arteries.  There is no flow limiting stenosis of the anterior, middle, or posterior cerebral arteries.  The basilar arteries widely patent with the right vertebral sole contributor.  There is no visible cerebellar branch occlusion.  There appear to be reduced number of M3 and M4 vessels on the right consistent with the observed large infarct.  T1 shortening on MRA is reflective of prior right temporal hemorrhage, not abnormal flow related  enhancement  IMPRESSION: There is no acute proximal occlusion or flow-limiting stenosis. Reduced right MCA trifurcation vessels is a chronic finding.  Original Report Authenticated By: Elsie Stain, M.D.   Dg Chest Portable 1 View  11/15/2011  *RADIOLOGY REPORT*  Clinical Data: Larey Seat hitting head, slurred speech  PORTABLE CHEST - 1 VIEW  Comparison: Portable chest x-ray of 05/28/2011  Findings: The lungs are clear and hyperaerated consistent with a degree of COPD.  No active process is seen.  Cardiomegaly is stable.  No bony abnormality is noted.  IMPRESSION: COPD.  Cardiomegaly.  No active lung disease.  Original Report Authenticated By: Juline Patch, M.D.    Medications: Scheduled Meds:   . diltiazem  240 mg Oral q morning - 10a  . docusate sodium  100 mg Oral QHS  . levetiracetam  500 mg Intravenous Once  . levETIRAcetam  500 mg Oral BID  . levothyroxine  50 mcg Oral Q0600  . methylphenidate  2.5 mg Oral q morning - 10a  . mulitivitamin with minerals  1 tablet Oral Q1200  . polyethylene glycol  17 g Oral Daily  . warfarin  6 mg Oral NOW  . warfarin  7.5 mg Oral ONCE-1800  . Warfarin - Pharmacist Dosing Inpatient   Does not apply q1800  . DISCONTD: polyethylene glycol powder  17 g Oral q morning - 10a   Continuous Infusions:   . sodium chloride 100 mL/hr at 11/15/11 1503  . sodium chloride 20 mL/hr at 11/15/11 2125   PRN Meds:.  Assessment/ 78 y/o female with hx of large rt MCA stroke 5 moths back with residual LUE weakness, Afib on coumadin, HT, HL presented with syncope and possible witnessed seizure by her  husband. MRI brain showing a small infarct adjacent to her previous infarct.    Plan:  *syncope with CVA (cerebral infarction) No new focal weakness.old RUE weakness from fairly recent stroke  MRI brain showing a new area of small infarct adjacent to the old infarct Cont  Coumadin, INR sub therapeutic on admission, will dose per pharmacy 2D echo and carotid doppler  ordered PT/OT eval Her LDL is 111 and should be on statin. Will start on crestor ?Seizures  patient had some jerky movements as per husband  started on keppra and will be continued for ow.  EEG done and results pending  neurology following     Atrial fibrillation Stable on tele  subthehrautic INR. Dose adjust per pharmacy Cont cardizem   Hypothyroidism cotn synthroid   HTN (hypertension) BP stable  DVT prophylaxis  Full code   LOS: 1 day   Lauryl Seyer 11/16/2011, 2:26 PM

## 2011-11-17 ENCOUNTER — Telehealth: Payer: Self-pay | Admitting: Cardiology

## 2011-11-17 ENCOUNTER — Encounter: Payer: Medicare Other | Admitting: Occupational Therapy

## 2011-11-17 DIAGNOSIS — R569 Unspecified convulsions: Secondary | ICD-10-CM | POA: Diagnosis present

## 2011-11-17 DIAGNOSIS — R55 Syncope and collapse: Secondary | ICD-10-CM | POA: Diagnosis present

## 2011-11-17 LAB — PROTIME-INR
INR: 1.89 — ABNORMAL HIGH (ref 0.00–1.49)
Prothrombin Time: 22 seconds — ABNORMAL HIGH (ref 11.6–15.2)

## 2011-11-17 LAB — GLUCOSE, CAPILLARY
Glucose-Capillary: 89 mg/dL (ref 70–99)
Glucose-Capillary: 92 mg/dL (ref 70–99)

## 2011-11-17 LAB — BASIC METABOLIC PANEL
CO2: 26 mEq/L (ref 19–32)
Chloride: 105 mEq/L (ref 96–112)
Sodium: 140 mEq/L (ref 135–145)

## 2011-11-17 LAB — MAGNESIUM: Magnesium: 2.2 mg/dL (ref 1.5–2.5)

## 2011-11-17 MED ORDER — WARFARIN SODIUM 7.5 MG PO TABS
7.5000 mg | ORAL_TABLET | Freq: Once | ORAL | Status: DC
  Filled 2011-11-17: qty 1

## 2011-11-17 MED ORDER — LEVETIRACETAM 500 MG PO TABS: 500.0000 mg | ORAL_TABLET | Freq: Two times a day (BID) | ORAL | Status: DC

## 2011-11-17 MED ORDER — WARFARIN SODIUM 5 MG PO TABS: 7.5000 mg | ORAL_TABLET | Freq: Every evening | ORAL | Status: DC

## 2011-11-17 MED ORDER — ATORVASTATIN CALCIUM 10 MG PO TABS: 10.0000 mg | ORAL_TABLET | Freq: Every day | ORAL | Status: DC

## 2011-11-17 NOTE — Progress Notes (Signed)
Stroke Team Progress Note  HISTORY [name] is an 78 y.o. female who was doing dishes with her husband. Was noted to have a snycopal episode. Patient is amnestic of the event. On further conversation with husband the patient was noted to have some head nodding prior to the syncopal event which is unusual for her. On exam after the event the patient had eye deviation to the right and left facial droop with worsening left sided weakness. The patient does have a residual left hemiparesis from an old stroke but was ambulatory. Patient's initial NIHSS of 5.   SUBJECTIVE   OBJECTIVE Most recent Vital Signs: Temp: 97.8 F (36.6 C) (02/28 0600) Temp src: Oral (02/28 0600) BP: 123/77 mmHg (02/28 0600) Pulse Rate: 71  (02/28 0600) Respiratory Rate: 18 O2 Saturation: 98%  CBG (last 3)   Basename 11/17/11 0723 11/16/11 2216 11/16/11 1644  GLUCAP 89 91 88   Intake/Output from previous day: 02/27 0701 - 02/28 0700 In: 861.7 [I.V.:861.7] Out: -   IV Fluid Intake:      . sodium chloride 20 mL/hr at 11/15/11 2125  . DISCONTD: sodium chloride 100 mL/hr at 11/15/11 1503   Medications     . atorvastatin  10 mg Oral q1800  . diltiazem  240 mg Oral q morning - 10a  . docusate sodium  100 mg Oral QHS  . levETIRAcetam  500 mg Oral BID  . levothyroxine  50 mcg Oral Q0600  . methylphenidate  2.5 mg Oral q morning - 10a  . mulitivitamin with minerals  1 tablet Oral Q1200  . polyethylene glycol  17 g Oral Daily  . warfarin  7.5 mg Oral ONCE-1800  . Warfarin - Pharmacist Dosing Inpatient   Does not apply q1800  . DISCONTD: atorvastatin  20 mg Oral q1800  PRN   Diet:  Cardiac thin liquids Activity: Bathroom privileges DVT Prophylaxis:  SCDs, Coumadin  Significant Diagnostic Studies: CBC    Component Value Date/Time   WBC 8.1 11/15/2011 1320   RBC 4.38 11/15/2011 1320   HGB 14.3 11/15/2011 1333   HCT 42.0 11/15/2011 1333   PLT 174 11/15/2011 1320   MCV 89.3 11/15/2011 1320   MCH  31.1 11/15/2011 1320   MCHC 34.8 11/15/2011 1320   RDW 15.1 11/15/2011 1320   LYMPHSABS 3.3 11/15/2011 1320   MONOABS 0.8 11/15/2011 1320   EOSABS 0.2 11/15/2011 1320   BASOSABS 0.0 11/15/2011 1320   CMP    Component Value Date/Time   NA 138 11/15/2011 1333   K 3.8 11/15/2011 1333   CL 106 11/15/2011 1333   CO2 21 11/15/2011 1320   GLUCOSE 121* 11/15/2011 1333   BUN 22 11/15/2011 1333   CREATININE 1.00 11/15/2011 1333   CALCIUM 10.1 11/15/2011 1320   PROT 6.7 05/30/2011 0500   ALBUMIN 2.7* 05/30/2011 0500   AST 40* 05/30/2011 0500   ALT 26 05/30/2011 0500   ALKPHOS 54 05/30/2011 0500   BILITOT 0.5 05/30/2011 0500   GFRNONAA 61* 11/15/2011 1320   GFRAA 71* 11/15/2011 1320   COAGS Lab Results  Component Value Date   INR 1.89* 11/17/2011   INR 1.82* 11/16/2011   INR 1.90* 11/15/2011   Lipid Panel    Component Value Date/Time   CHOL 213* 11/16/2011 0610   TRIG 67 11/16/2011 0610   HDL 89 11/16/2011 0610   CHOLHDL 2.4 11/16/2011 0610   VLDL 13 11/16/2011 0610   LDLCALC 111* 11/16/2011 0610   HgbA1C  Lab Results  Component Value Date   HGBA1C 5.5 11/15/2011   Urine Drug Screen    Component Value Date/Time   LABOPIA NONE DETECTED 11/16/2011 0605   COCAINSCRNUR NONE DETECTED 11/16/2011 0605   LABBENZ NONE DETECTED 11/16/2011 0605   AMPHETMU NONE DETECTED 11/16/2011 0605   THCU NONE DETECTED 11/16/2011 0605   LABBARB NONE DETECTED 11/16/2011 0605    11/15/11 CT of the brain   IMPRESSION:  Old right MCA infarct. Atrophy and small vessel disease. No acute  intracranial abnormality.  11/15/11 MRI of the brain   IMPRESSION:  Adjacent to the large area of remote right MCA infarction, small  foci of acute ischemia are identified in the subcortical white  matter  11/15/11 MRA of the brain   IMPRESSION:  There is no acute proximal occlusion or flow-limiting stenosis.  Reduced right MCA trifurcation vessels is a chronic finding.   11/16/11 2 D Echocardiogram Left ventricle: The cavity size was normal.  Wall thickness was normal. Systolic function was normal. The estimated ejection fraction was in the range of 55% to 60%. Wall motion was normal; there were no regional wall motion abnormalities. The study is not technically sufficient to allow evaluation of LV diastolic function. Mitral valve: Mild regurgitation. - Pericardium, extracardiac: A trivial pericardial effusion was identified.  11/16/11 Carotid Doppler  Bilateral carotid artery duplex completed. Preliminary report is no evidence of significant ICA stenosis   CXR IMPRESSION:  COPD. Cardiomegaly. No active lung disease.    EKG  A. Fib, PVC's, right axis deviation, heart rate 113  11/15/11 EEG: IMPRESSION: This was an abnormal awake and drowsy EEG due to presence  of continuous right anterior mid temporal slowing. The finding maybe suggestive of all underlying structural abnormality of an etiology in that head region. Single channel EKG monitor detected an irregular rhythm. If clinically warranted, clinical correlation is suggested. No epileptiform activity seen.  Dr. Beverely Pace.  Physical Exam   The patient is alert and cooperative.  Neurologic exam reveals full extraocular movements, speech is slightly dysarthric. Visual fields are full.  Motor testing reveals good strength of all four extremities.  The patient has good finger-nose-finger and heel-to-shin bilaterally. Gait was not tested.  Deep tendon reflexes are symmetric and normal.  No drift of the upper extremities is noted. Good facial symmetry is seen.    Toes are down going bilaterally.    ASSESSMENT [name] is a 78 y.o. female with a possible right brain stroke, secondary to AFIB. On warfarin for secondary stroke prevention.  Stroke risk factors:  atrial fibrillation, dyslipidemia, hypertension.  Hospital day # 2  TREATMENT/PLAN Continue warfarin for secondary stroke prevention.  Goal INR 2.0-3.0. The patient likely suffered a seizure prior to  admission. Continue Keppra. The diffusion weighted positive image by MRI could in part be related to the seizure. The patient will continue on the Coumadin. EEG revealed slowing in the right anterior mid temporal region without epileptiform activity.  This does not exclude seizure potential, as scar from prior CVA may be epileptogenic.  No further neurologic intervention is recommended at this time.  If further questions arise, please call or page at that time.  Thank you for allowing neurology to participate in the care of this patient. Recommend outpatient neuro follow up for the seizures. Keep on keppra.  Marya Fossa PA-C [facility] [phone] 11/17/2011 8:14 Lesly Dukes

## 2011-11-17 NOTE — Telephone Encounter (Signed)
Pt in hospital and wont be released until blood levels are normal, husband has question for nurse, pls call

## 2011-11-17 NOTE — Telephone Encounter (Signed)
Mr Jeancharles wants Dr Antoine Poche to know that Mrs Tancredi is at [facility] currently.  He states they are having trouble getting her INR therapeutic.  FYI.

## 2011-11-17 NOTE — Progress Notes (Signed)
Speech Language/Pathology  Speech-language-cognitive assessment was ordered, however, bedside swallow eval completed (by ST) and did not appear to have any needs regarding speech-cognition.  Paged MD who stated Speech-cognitive assessment was ordered in error.  SLP completed this order.  Breck Coons [place].Ed [employer] [phone]  11/17/2011

## 2011-11-17 NOTE — Discharge Summary (Addendum)
Patient ID: TORIA MONTE MRN: [id number] DOB/AGE: 1931-05-30 78 y.o.  Admit date: 11/15/2011 Discharge date: 11/17/2011  Primary Care Physician:  [name], MD, MD  Discharge Diagnoses:     Principal Problem:  *CVA (cerebral infarction) *acute seizures  Active Problems:  Syncope with fall  Atrial fibrillation  History of CVA (cerebrovascular accident)  Hypothyroidism  HTN (hypertension)    Medication List  As of 11/17/2011 12:41 PM   TAKE these medications         atorvastatin 10 MG tablet   Commonly known as: LIPITOR   Take 1 tablet (10 mg total) by mouth daily at 6 PM.      CALTRATE 600 PO   Take 1 tablet by mouth daily at 12 noon.      diltiazem 240 MG 24 hr capsule   Commonly known as: DILACOR XR   Take 240 mg by mouth every morning.      levETIRAcetam 500 MG tablet   Commonly known as: KEPPRA   Take 1 tablet (500 mg total) by mouth 2 (two) times daily.      levothyroxine 50 MCG tablet   Commonly known as: SYNTHROID, LEVOTHROID   Take 50 mcg by mouth every morning.      methylphenidate 5 MG tablet   Commonly known as: RITALIN   Take 2.5 mg by mouth every morning.      mulitivitamin with minerals Tabs   Take 1 tablet by mouth daily at 12 noon.      polycarbophil 625 MG tablet   Commonly known as: FIBERCON   Take 625 mg by mouth daily at 12 noon.      polyethylene glycol powder powder   Commonly known as: GLYCOLAX/MIRALAX   Take 17 g by mouth every morning.      STOOL SOFTENER PO   Take 1 capsule by mouth at bedtime.      warfarin 5 MG tablet   Commonly known as: COUMADIN   Take 1.5 tablets (7.5 mg total) by mouth every evening. Takes daily at 1700            Disposition and Follow-up:  Home with follow up with PCP in 1 week. Patient needs frequent monitoring of her INR Follow up with Dr Pearlean Brownie in 2 months  Consults:  Leonette Most ( neurology)  Significant Diagnostic Studies:  Ct Head Wo Contrast  11/15/2011  *RADIOLOGY REPORT*   Clinical Data: Code stroke, left facial droop, recent fall while on Coumadin  CT HEAD WITHOUT CONTRAST  Technique:  Contiguous axial images were obtained from the base of the skull through the vertex without contrast.  Comparison: CT brain of 05/25/2011  Findings: The ventricular system remains somewhat prominent as are the cortical sulci and the septum is in a normal midline position. An old right MCA distribution infarct is noted and appears stable. No acute infarct, hemorrhage, or mass lesion is seen.  No acute bony abnormality is seen.  The paranasal sinuses are clear.  IMPRESSION: Old right MCA infarct.  Atrophy and small vessel disease.  No acute intracranial abnormality.  Critical Value/emergent results were called by telephone at the time of interpretation on 11/15/2011  at 1:36 p.m.  to  Dr. Manus Gunning in the emergency department, who verbally acknowledged these results.  Original Report Authenticated By: Juline Patch, M.D.   Ct Cervical Spine Wo Contrast  11/15/2011  *RADIOLOGY REPORT*  Clinical Data: Larey Seat hitting head, loss of consciousness, dizziness  CT CERVICAL SPINE WITHOUT CONTRAST  Technique:  Multidetector CT imaging of the cervical spine was performed. Multiplanar CT image reconstructions were also generated.  Comparison: None.  Findings: The cervical vertebrae are in normal alignment.  There are diffuse degenerative changes, greatest at C5-6 and C6-7 levels with loss of disc space and spurring.  The odontoid process is intact.  No cervical spine fracture is seen.  No prevertebral soft tissue swelling is noted.  There is some central canal narrowing at C5-6 and C6-7 levels.  Biapical pleuroparenchymal scarring is noted.  IMPRESSION:  1.  Normal alignment with degenerative change particularly at C5-6 and C6-7. 2.  No acute fracture.  Original Report Authenticated By: Juline Patch, M.D.   Mr Angiogram Head Wo Contrast  11/15/2011  *RADIOLOGY REPORT*  Clinical Data:  Code stroke.  Severe  confusion.  Previous endovascular treatment for right middle cerebral artery stenosis.  MRI HEAD WITHOUT CONTRAST MRA HEAD WITHOUT CONTRAST  Technique:  Multiplanar, multiecho pulse sequences of the brain and surrounding structures were obtained without intravenous contrast. Angiographic images of the head were obtained using MRA technique without contrast.  Comparison:  CT scans beginning 05/21/2011.  MRI HEAD  Findings:  Large remote right MCA territory infarct affects the frontal and temporal lobes as well as the insular cortex and portions of the basal ganglia, with gliosis and encephalomalacia, as well as scattered foci of chronic hemorrhage.  There are small foci of restricted diffusion in the centrum semiovale in the right posterior frontal and superior temporal regions representing new infarction in the same vascular territory, but peripheral to the area of chronically infarcted brain (image 17-18, series 3). No acute hemorrhage. No mass lesion or extra-axial fluid.  Atrophy with chronic microvascular ischemic change.  Patent carotid and basilar arteries.  Normal midline structures.  No sinus or mastoid disease.  IMPRESSION: Adjacent to the large area of remote right MCA infarction, small foci of acute ischemia are identified in the subcortical white matter  Chronic atrophy and small vessel disease.  MRA HEAD  Findings: Widely patent carotid and basilar arteries.  There is no flow limiting stenosis of the anterior, middle, or posterior cerebral arteries.  The basilar arteries widely patent with the right vertebral sole contributor.  There is no visible cerebellar branch occlusion.  There appear to be reduced number of M3 and M4 vessels on the right consistent with the observed large infarct.  T1 shortening on MRA is reflective of prior right temporal hemorrhage, not abnormal flow related enhancement  IMPRESSION: There is no acute proximal occlusion or flow-limiting stenosis. Reduced right MCA trifurcation  vessels is a chronic finding.  Original Report Authenticated By: Elsie Stain, M.D.   Mr Brain Wo Contrast  11/15/2011  *RADIOLOGY REPORT*  Clinical Data:  Code stroke.  Severe confusion.  Previous endovascular treatment for right middle cerebral artery stenosis.  MRI HEAD WITHOUT CONTRAST MRA HEAD WITHOUT CONTRAST  Technique:  Multiplanar, multiecho pulse sequences of the brain and surrounding structures were obtained without intravenous contrast. Angiographic images of the head were obtained using MRA technique without contrast.  Comparison:  CT scans beginning 05/21/2011.  MRI HEAD  Findings:  Large remote right MCA territory infarct affects the frontal and temporal lobes as well as the insular cortex and portions of the basal ganglia, with gliosis and encephalomalacia, as well as scattered foci of chronic hemorrhage.  There are small foci of restricted diffusion in the centrum semiovale in the right posterior frontal and superior temporal regions representing new infarction in the same vascular  territory, but peripheral to the area of chronically infarcted brain (image 17-18, series 3). No acute hemorrhage. No mass lesion or extra-axial fluid.  Atrophy with chronic microvascular ischemic change.  Patent carotid and basilar arteries.  Normal midline structures.  No sinus or mastoid disease.  IMPRESSION: Adjacent to the large area of remote right MCA infarction, small foci of acute ischemia are identified in the subcortical white matter  Chronic atrophy and small vessel disease.  MRA HEAD  Findings: Widely patent carotid and basilar arteries.  There is no flow limiting stenosis of the anterior, middle, or posterior cerebral arteries.  The basilar arteries widely patent with the right vertebral sole contributor.  There is no visible cerebellar branch occlusion.  There appear to be reduced number of M3 and M4 vessels on the right consistent with the observed large infarct.  T1 shortening on MRA is reflective of  prior right temporal hemorrhage, not abnormal flow related enhancement  IMPRESSION: There is no acute proximal occlusion or flow-limiting stenosis. Reduced right MCA trifurcation vessels is a chronic finding.  Original Report Authenticated By: Elsie Stain, M.D.   Dg Chest Portable 1 View  11/15/2011  *RADIOLOGY REPORT*  Clinical Data: Larey Seat hitting head, slurred speech  PORTABLE CHEST - 1 VIEW  Comparison: Portable chest x-ray of 05/28/2011  Findings: The lungs are clear and hyperaerated consistent with a degree of COPD.  No active process is seen.  Cardiomegaly is stable.  No bony abnormality is noted.  IMPRESSION: COPD.  Cardiomegaly.  No active lung disease.  Original Report Authenticated By: Juline Patch, M.D.    Brief H and P: For complete details please refer to admission H and P, but in brief 78 year old white female with past medical history of a stroke about 5 months ago who presented to the emergency room today with loss of consciousness and a possible seizure. The patient was at home at her kitchen and this was witnessed by her husband when she suddenly started staring off into space and was not responding to his voice and then all of a sudden fell on the floor. The husband witnessed some head jerking movements and EMS was called. There is no seizure activity witnessed by EMS although the patient was noted to have some biting of the lower lip. In the emergency room, the initial CT scan of the head was negative. She was evaluated as a code stroke and neuro hospitalists recommended further evaluation with MRI and admission. Patient underwent an MRI which several hours later was noted to have an acute infarct a small foci in the white matter near her previous right MCA distribution.   Physical Exam on Discharge:  Filed Vitals:   11/17/11 0100 11/17/11 0600 11/17/11 0815 11/17/11 0900  BP: 117/73 123/77 109/67 101/64  Pulse: 79 71 101 79  Temp: 97.4 F (36.3 C) 97.8 F (36.6 C) 97.6 F  (36.4 C) 98.5 F (36.9 C)  TempSrc: Oral Oral Oral Oral  Resp: 19 18 18 18   Height:      Weight:      SpO2: 99% 98% 94% 97%     Intake/Output Summary (Last 24 hours) at 11/17/11 1241 Last data filed at 11/17/11 0600  Gross per 24 hour  Intake 861.72 ml  Output      0 ml  Net 861.72 ml   General:elderly female in no acute distress.  HEENT: no pallor, no icterus, moist oral mucosa, no JVD, no lymphadenopathy  Heart: irregularly irregular, without murmurs, rubs,  gallops.  Lungs: Clear to auscultation bilaterally.  Abdomen: Soft, nontender, nondistended, positive bowel sounds.  Extremities: No clubbing cyanosis or edema with positive pedal pulses.  Neuro: Alert, awake, oriented x3, chronic left facial droop and left arm 4/5 power, normal power in all other extremities     CBC:    Component Value Date/Time   WBC 8.1 11/15/2011 1320   HGB 14.3 11/15/2011 1333   HCT 42.0 11/15/2011 1333   PLT 174 11/15/2011 1320   MCV 89.3 11/15/2011 1320   NEUTROABS 3.8 11/15/2011 1320   LYMPHSABS 3.3 11/15/2011 1320   MONOABS 0.8 11/15/2011 1320   EOSABS 0.2 11/15/2011 1320   BASOSABS 0.0 11/15/2011 1320    Basic Metabolic Panel:    Component Value Date/Time   NA 138 11/15/2011 1333   K 3.8 11/15/2011 1333   CL 106 11/15/2011 1333   CO2 21 11/15/2011 1320   BUN 22 11/15/2011 1333   CREATININE 1.00 11/15/2011 1333   GLUCOSE 121* 11/15/2011 1333   CALCIUM 10.1 11/15/2011 1320    Hospital Course:   *syncope with CVA (cerebral infarction)  Patient had no  new focal weakness.old RUE weakness and left facial droop from fairly recent stroke  MRI brain showing a new area of small infarct adjacent to the old infarct  Cont Coumadin, INR sub therapeutic since admission and appears it has been subtherapeutic for a while  causing a new stroke. Patient informs having INR checked once every 2-3 months only.  INR still subtherapeutic but improving. Discussed with neurology and recommend ok to discharge her and   have INR monitor as outpt.  2D echo and carotid doppler unremarkable  Her LDL is 111 and should be on statin. Started on lipitor  Patient will need to be on therapeutic INR before discharged   Seizures  patient had some jerky movements as per husband  started on keppra and will be continued for now.  EEG done shows no gross epileptiform activity but showed right anterior and mid temporal slowing . Have discussed with neurology and given her recurrent stroke and slowing seen on EEG she is at high risk for seizure activity.  Patient started on keppra and recommended to continue for now.   Atrial fibrillation  Stable on tele  subthehrautic INR of 1.89 today. Dose increased to 7.5 mg and should continue until follow up with PCP in next few days. i will discuss with her PCP regarding close follow up appts for her INR Cont cardizem   Hypothyroidism  cotn synthroid   HTN (hypertension)  BP stable    Patient clinically stable for discharge with outpt follow up.  Time spent on Discharge: 45 minutes  Signed: Eddie North 11/17/2011, 12:41 PM    i have spoken with Dr Eloise Harman at [facility] who is covering for Dr Felipa Eth today and have discussed the need for patient's close follow up and her frequent INR checks. He informs to have patient call the office to have her INR checked tomorrow and follow up thereafter. Discussed with the patient about the plan and she understands it.

## 2011-11-17 NOTE — Progress Notes (Signed)
ANTICOAGULATION CONSULT NOTE - Follow Up Consult  Pharmacy Consult for Coumadin Indication: atrial fibrillation, new CVA  No Known Allergies  Patient Measurements: Height: 5\' 8"  (172.7 cm) Weight: 110 lb (49.896 kg) IBW/kg (Calculated) : 63.9   Vital Signs: Temp: 98.5 F (36.9 C) (02/28 0900) Temp src: Oral (02/28 0900) BP: 101/64 mmHg (02/28 0900) Pulse Rate: 79  (02/28 0900)  Labs:  Basename 11/17/11 0700 11/16/11 0930 11/15/11 1333 11/15/11 1320  HGB -- -- 14.3 13.6  HCT -- -- 42.0 39.1  PLT -- -- -- 174  APTT -- -- -- 31  LABPROT 22.0* 21.4* -- 22.1*  INR 1.89* 1.82* -- 1.90*  HEPARINUNFRC -- -- -- --  CREATININE -- -- 1.00 0.88  CKTOTAL -- -- -- 54  CKMB -- -- -- 2.2  TROPONINI -- -- -- <0.30   Estimated Creatinine Clearance: 36.5 ml/min (by C-G formula based on Cr of 1).  Assessment:  INR remains below goal.  Had been taking Coumadin 5 mg daily prior to admission. INR 1.9 on admission.  Received 6 mg on 2/26, then 7.5 mg on 2/27.  Goal of Therapy:  INR 2-3   Plan:    Will repeat Coumadin 7.5 mg today.   Continue daily PT/INR.  Dennie Fetters, RPh Pager: [phone] 11/17/2011,10:51 AM

## 2011-11-17 NOTE — Procedures (Signed)
EEG ID:  [id number].  HISTORY:  A 78 years old woman referred for rule out seizure.  MEDICATIONS:  Keppra.  CONDITION OF RECORDING:  This 16-lead EEG was recorded with the patient in awake and drowsy states.  Background rhythm: background patterns in wakefulness were well-organized with a well-sustained posterior dominant rhythm of 8 Hz, symmetrical and reactive to opening and closing. Drowsiness was associated with mild attenuation of voltage and slowing of frequencies.  Abnormal potentials: continuous right anterior and mid- temporal slowing was noted.  No epileptiform activity was noted.  ACTIVATION PROCEDURES:  Hyperventilation was not performed.  Photic stimulation was not performed.  EKG:  Single-channel EKG monitoring detected an irregular rhythm.  IMPRESSION:  This was an abnormal awake and drowsy EEG due to presence of continuous right anterior and mid-temporal slowing.  The finding maybe suggestive  of an underlying structural abnormality of any etiology in that head region. Single channel of EKG monitoring detected an irregular rhythm.  If clinically warranted, clinical correlation is suggested.          ______________________________ Carmell Austria, MD    [id number] D:  11/16/2011 12:59:30  T:  11/16/2011 14:27:27  Job #:  [id number]

## 2011-11-17 NOTE — Progress Notes (Signed)
Subjective: Patient seen and examined this am. Discussed EEG , echo and carotid doppler findings with patient and her husband  Objective:  Vital signs in last 24 hours:  Filed Vitals:   11/17/11 0100 11/17/11 0600 11/17/11 0815 11/17/11 0900  BP: 117/73 123/77 109/67 101/64  Pulse: 79 71 101 79  Temp: 97.4 F (36.3 C) 97.8 F (36.6 C) 97.6 F (36.4 C) 98.5 F (36.9 C)  TempSrc: Oral Oral Oral Oral  Resp: 19 18 18 18   Height:      Weight:      SpO2: 99% 98% 94% 97%    Intake/Output from previous day:   Intake/Output Summary (Last 24 hours) at 11/17/11 0951 Last data filed at 11/17/11 0600  Gross per 24 hour  Intake 861.72 ml  Output      0 ml  Net 861.72 ml    Physical Exam:  General:elderly female in no acute distress.  HEENT: no pallor, no icterus, moist oral mucosa, no JVD, no lymphadenopathy  Heart: irregularly irregular, without murmurs, rubs, gallops.  Lungs: Clear to auscultation bilaterally.  Abdomen: Soft, nontender, nondistended, positive bowel sounds.  Extremities: No clubbing cyanosis or edema with positive pedal pulses.  Neuro: Alert, awake, oriented x3, chronic left facial droop and  left arm 4/5 power, normal power in all other extremities     Lab Results:  Basic Metabolic Panel:    Component Value Date/Time   NA 138 11/15/2011 1333   K 3.8 11/15/2011 1333   CL 106 11/15/2011 1333   CO2 21 11/15/2011 1320   BUN 22 11/15/2011 1333   CREATININE 1.00 11/15/2011 1333   GLUCOSE 121* 11/15/2011 1333   CALCIUM 10.1 11/15/2011 1320   CBC:    Component Value Date/Time   WBC 8.1 11/15/2011 1320   HGB 14.3 11/15/2011 1333   HCT 42.0 11/15/2011 1333   PLT 174 11/15/2011 1320   MCV 89.3 11/15/2011 1320   NEUTROABS 3.8 11/15/2011 1320   LYMPHSABS 3.3 11/15/2011 1320   MONOABS 0.8 11/15/2011 1320   EOSABS 0.2 11/15/2011 1320   BASOSABS 0.0 11/15/2011 1320    No results found for this or any previous visit (from the past 240 hour(s)).  Studies/Results: Ct  Head Wo Contrast  11/15/2011  *RADIOLOGY REPORT*  Clinical Data: Code stroke, left facial droop, recent fall while on Coumadin  CT HEAD WITHOUT CONTRAST  Technique:  Contiguous axial images were obtained from the base of the skull through the vertex without contrast.  Comparison: CT brain of 05/25/2011  Findings: The ventricular system remains somewhat prominent as are the cortical sulci and the septum is in a normal midline position. An old right MCA distribution infarct is noted and appears stable. No acute infarct, hemorrhage, or mass lesion is seen.  No acute bony abnormality is seen.  The paranasal sinuses are clear.  IMPRESSION: Old right MCA infarct.  Atrophy and small vessel disease.  No acute intracranial abnormality.  Critical Value/emergent results were called by telephone at the time of interpretation on 11/15/2011  at 1:36 p.m.  to  Dr. Manus Gunning in the emergency department, who verbally acknowledged these results.  Original Report Authenticated By: Juline Patch, M.D.   Ct Cervical Spine Wo Contrast  11/15/2011  *RADIOLOGY REPORT*  Clinical Data: Larey Seat hitting head, loss of consciousness, dizziness  CT CERVICAL SPINE WITHOUT CONTRAST  Technique:  Multidetector CT imaging of the cervical spine was performed. Multiplanar CT image reconstructions were also generated.  Comparison: None.  Findings: The cervical  vertebrae are in normal alignment.  There are diffuse degenerative changes, greatest at C5-6 and C6-7 levels with loss of disc space and spurring.  The odontoid process is intact.  No cervical spine fracture is seen.  No prevertebral soft tissue swelling is noted.  There is some central canal narrowing at C5-6 and C6-7 levels.  Biapical pleuroparenchymal scarring is noted.  IMPRESSION:  1.  Normal alignment with degenerative change particularly at C5-6 and C6-7. 2.  No acute fracture.  Original Report Authenticated By: Juline Patch, M.D.   Mr Angiogram Head Wo Contrast  11/15/2011  *RADIOLOGY  REPORT*  Clinical Data:  Code stroke.  Severe confusion.  Previous endovascular treatment for right middle cerebral artery stenosis.  MRI HEAD WITHOUT CONTRAST MRA HEAD WITHOUT CONTRAST  Technique:  Multiplanar, multiecho pulse sequences of the brain and surrounding structures were obtained without intravenous contrast. Angiographic images of the head were obtained using MRA technique without contrast.  Comparison:  CT scans beginning 05/21/2011.  MRI HEAD  Findings:  Large remote right MCA territory infarct affects the frontal and temporal lobes as well as the insular cortex and portions of the basal ganglia, with gliosis and encephalomalacia, as well as scattered foci of chronic hemorrhage.  There are small foci of restricted diffusion in the centrum semiovale in the right posterior frontal and superior temporal regions representing new infarction in the same vascular territory, but peripheral to the area of chronically infarcted brain (image 17-18, series 3). No acute hemorrhage. No mass lesion or extra-axial fluid.  Atrophy with chronic microvascular ischemic change.  Patent carotid and basilar arteries.  Normal midline structures.  No sinus or mastoid disease.  IMPRESSION: Adjacent to the large area of remote right MCA infarction, small foci of acute ischemia are identified in the subcortical white matter  Chronic atrophy and small vessel disease.  MRA HEAD  Findings: Widely patent carotid and basilar arteries.  There is no flow limiting stenosis of the anterior, middle, or posterior cerebral arteries.  The basilar arteries widely patent with the right vertebral sole contributor.  There is no visible cerebellar branch occlusion.  There appear to be reduced number of M3 and M4 vessels on the right consistent with the observed large infarct.  T1 shortening on MRA is reflective of prior right temporal hemorrhage, not abnormal flow related enhancement  IMPRESSION: There is no acute proximal occlusion or  flow-limiting stenosis. Reduced right MCA trifurcation vessels is a chronic finding.  Original Report Authenticated By: Elsie Stain, M.D.   Mr Brain Wo Contrast  11/15/2011  *RADIOLOGY REPORT*  Clinical Data:  Code stroke.  Severe confusion.  Previous endovascular treatment for right middle cerebral artery stenosis.  MRI HEAD WITHOUT CONTRAST MRA HEAD WITHOUT CONTRAST  Technique:  Multiplanar, multiecho pulse sequences of the brain and surrounding structures were obtained without intravenous contrast. Angiographic images of the head were obtained using MRA technique without contrast.  Comparison:  CT scans beginning 05/21/2011.  MRI HEAD  Findings:  Large remote right MCA territory infarct affects the frontal and temporal lobes as well as the insular cortex and portions of the basal ganglia, with gliosis and encephalomalacia, as well as scattered foci of chronic hemorrhage.  There are small foci of restricted diffusion in the centrum semiovale in the right posterior frontal and superior temporal regions representing new infarction in the same vascular territory, but peripheral to the area of chronically infarcted brain (image 17-18, series 3). No acute hemorrhage. No mass lesion or extra-axial fluid.  Atrophy with chronic microvascular ischemic change.  Patent carotid and basilar arteries.  Normal midline structures.  No sinus or mastoid disease.  IMPRESSION: Adjacent to the large area of remote right MCA infarction, small foci of acute ischemia are identified in the subcortical white matter  Chronic atrophy and small vessel disease.  MRA HEAD  Findings: Widely patent carotid and basilar arteries.  There is no flow limiting stenosis of the anterior, middle, or posterior cerebral arteries.  The basilar arteries widely patent with the right vertebral sole contributor.  There is no visible cerebellar branch occlusion.  There appear to be reduced number of M3 and M4 vessels on the right consistent with the  observed large infarct.  T1 shortening on MRA is reflective of prior right temporal hemorrhage, not abnormal flow related enhancement  IMPRESSION: There is no acute proximal occlusion or flow-limiting stenosis. Reduced right MCA trifurcation vessels is a chronic finding.  Original Report Authenticated By: Elsie Stain, M.D.   Dg Chest Portable 1 View  11/15/2011  *RADIOLOGY REPORT*  Clinical Data: Larey Seat hitting head, slurred speech  PORTABLE CHEST - 1 VIEW  Comparison: Portable chest x-ray of 05/28/2011  Findings: The lungs are clear and hyperaerated consistent with a degree of COPD.  No active process is seen.  Cardiomegaly is stable.  No bony abnormality is noted.  IMPRESSION: COPD.  Cardiomegaly.  No active lung disease.  Original Report Authenticated By: Juline Patch, M.D.    Medications: Scheduled Meds:   . atorvastatin  10 mg Oral q1800  . diltiazem  240 mg Oral q morning - 10a  . docusate sodium  100 mg Oral QHS  . levETIRAcetam  500 mg Oral BID  . levothyroxine  50 mcg Oral Q0600  . methylphenidate  2.5 mg Oral q morning - 10a  . mulitivitamin with minerals  1 tablet Oral Q1200  . polyethylene glycol  17 g Oral Daily  . warfarin  7.5 mg Oral ONCE-1800  . Warfarin - Pharmacist Dosing Inpatient   Does not apply q1800  . DISCONTD: atorvastatin  20 mg Oral q1800   Continuous Infusions:   . sodium chloride 20 mL/hr at 11/15/11 2125  . DISCONTD: sodium chloride 100 mL/hr at 11/15/11 1503   PRN Meds:.  Assessment/  78 y/o female with hx of large rt MCA stroke 5 moths back with residual LUE weakness, Afib on coumadin, HT, HL presented with syncope and possible witnessed seizure by her husband. MRI brain showing a small infarct adjacent to her previous infarct.   Plan:  *syncope with CVA (cerebral infarction)  No new focal weakness.old RUE weakness and left facial droop  from fairly recent stroke  MRI brain showing a new area of small infarct adjacent to the old infarct  Cont  Coumadin, INR sub therapeutic since admission and appears it has been subtherapeutic causing a new stroke. Patient informs having INR checked once every 2-3 months only.   INR still subtherapeutic and will adjust dose further. Have informed patient to get INR checked more frequently as outpatient.  2D echo and carotid doppler unremarkable PT/OT eval  Her LDL is 111 and should be on statin. Started on lipitor Patient will need to be on therapeutic INR before discharged  Seizures  patient had some jerky movements as per husband  started on keppra and will be continued for now.  EEG done shows no gross epileptiform activity but showed right anterior and mid temporal slowing . Have discussed with neurology and given  her recurrent stroke and slowing seen on EEG she is at high risk for seizure activity.  Patient started on keppra and recommended to continue for now.  Atrial fibrillation  Stable on tele  subthehrautic INR. Dose adjust per pharmacy  Cont cardizem   Hypothyroidism  cotn synthroid   HTN (hypertension)  BP stable   DVT prophylaxis   Full code      LOS: 2 days   [name] 11/17/2011, 9:51 AM

## 2011-11-18 NOTE — Telephone Encounter (Signed)
Will forward to Dr Hochrein for his knowledge 

## 2011-11-18 NOTE — Progress Notes (Signed)
   CARE MANAGEMENT NOTE 11/18/2011  Patient:  [name], [name]   Account Number:  [id number]  Date Initiated:  11/16/2011  Documentation initiated by:  Donn Pierini  Subjective/Objective Assessment:   Pt admitted with CVA     Action/Plan:   PTA pt lived at home with spouse, was independent with ADLs, stroke workup in progress   Anticipated DC Date:  11/17/2011   Anticipated DC Plan:  HOME/SELF CARE      DC Planning Services  CM consult      Choice offered to / List presented to:          [facility] arranged  HH - 11 Patient Refused      Status of service:  Completed, signed off Medicare Important Message given?   (If response is "NO", the following Medicare IM given date fields will be blank) Date Medicare IM given:   Date Additional Medicare IM given:    Discharge Disposition:  HOME/SELF CARE  Per UR Regulation:    Comments:  PCP- Vassie Loll  11/18/11 14:30  Letha Cape RN, BSN [phone] NCM called patient at home to see what agency she wanted to have hhpt/ot with.  Patient states she has decided that she does not want hh services , she will continue with her outpt  [facility].  11/16/11- 1530- Donn Pierini RN, BSN [phone] Spoke with pt and husband at bedside- per conversation pt lives at home has walker but does not use it. Pt is currently doing outpt PT at [facility] outpt neuro rehab- has appointment through Mar. 14. Pt has medication benefits uses [facility] and mail order. CM to follow for d/c needs.

## 2011-11-21 ENCOUNTER — Encounter: Payer: Medicare Other | Admitting: Occupational Therapy

## 2011-11-22 ENCOUNTER — Encounter: Payer: Medicare Other | Admitting: Occupational Therapy

## 2011-11-24 ENCOUNTER — Encounter: Payer: Medicare Other | Admitting: Occupational Therapy

## 2011-11-28 ENCOUNTER — Encounter: Payer: Medicare Other | Admitting: Occupational Therapy

## 2011-11-30 ENCOUNTER — Encounter: Payer: Medicare Other | Admitting: Occupational Therapy

## 2011-12-01 ENCOUNTER — Encounter: Payer: Medicare Other | Admitting: Occupational Therapy

## 2011-12-02 ENCOUNTER — Telehealth: Payer: Self-pay | Admitting: Cardiology

## 2011-12-02 NOTE — Telephone Encounter (Signed)
New msg Pt wants a copy of her meds and dosage to be faxed to [facility]- tara collins-rn at [phone] so she can set up her pill box in her home. Phone number is [phone].

## 2011-12-02 NOTE — Telephone Encounter (Signed)
Medication list faxed as requested

## 2012-01-30 ENCOUNTER — Ambulatory Visit: Payer: Medicare Other | Admitting: Cardiology

## 2012-04-26 ENCOUNTER — Encounter: Payer: Self-pay | Admitting: Cardiology

## 2012-04-26 ENCOUNTER — Ambulatory Visit (INDEPENDENT_AMBULATORY_CARE_PROVIDER_SITE_OTHER): Payer: Medicare Other | Admitting: Cardiology

## 2012-04-26 VITALS — BP 135/80 | HR 89 | Ht 68.0 in | Wt 106.4 lb

## 2012-04-26 DIAGNOSIS — I4891 Unspecified atrial fibrillation: Secondary | ICD-10-CM

## 2012-04-26 NOTE — Patient Instructions (Addendum)
The current medical regimen is effective;  continue present plan and medications.  Follow up in 6 months with Dr Hochrein.  You will receive a letter in the mail 2 months before you are due.  Please call us when you receive this letter to schedule your follow up appointment.  

## 2012-04-26 NOTE — Progress Notes (Signed)
HPI The patient presents for followup after hospitalization with a right MCA stroke. This was related to new onset atrial fibrillation. She had thrombolysis and thrombectomy. She was admitted after the last visit in this office with another CVA. At that time she did have a subtherapeutic Coumadin level.  Since her last hospitalization she has had no acute problems. She denies any chest pressure, neck or arm discomfort. She has had no symptomatic palpitations, presyncope or syncope. She denies any PND or orthopnea.  No Known Allergies  Current Outpatient Prescriptions  Medication Sig Dispense Refill  . atorvastatin (LIPITOR) 10 MG tablet Take 1 tablet (10 mg total) by mouth daily at 6 PM.  30 tablet  2  . Calcium Carbonate (CALTRATE 600 PO) Take 1 tablet by mouth daily at 12 noon.       . diltiazem (CARTIA XT) 240 MG 24 hr capsule Take 240 mg by mouth daily.      Tery Sanfilippo Calcium (STOOL SOFTENER PO) Take 1 capsule by mouth at bedtime.       . levETIRAcetam (KEPPRA) 500 MG tablet Take 1 tablet (500 mg total) by mouth 2 (two) times daily.  60 tablet  3  . levothyroxine (SYNTHROID, LEVOTHROID) 50 MCG tablet Take 50 mcg by mouth every morning.       . Multiple Vitamin (MULITIVITAMIN WITH MINERALS) TABS Take 1 tablet by mouth daily at 12 noon.      . polycarbophil (FIBERCON) 625 MG tablet Take 625 mg by mouth daily at 12 noon.      . polyethylene glycol powder (GLYCOLAX/MIRALAX) powder Take 17 g by mouth every morning.      . warfarin (COUMADIN) 5 MG tablet Take 1.5 tablets (7.5 mg total) by mouth every evening. Takes daily at 1700  30 tablet  0    Past Medical History  Diagnosis Date  . Cerebral infarction   . Dysphagia   . Atrial fibrillation   . Hyperlipidemia   . HTN (hypertension)   . Hypothyroidism   . Stroke   . Pneumonia     years ago    ROS:   As stated in the HPI and negative for all other systems.  PHYSICAL EXAM BP 135/80  Pulse 89  Ht 5\' 8"  (1.727 m)  Wt 106 lb 6.4  oz (48.263 kg)  BMI 16.18 kg/m2 GENERAL:   Frail appearing, very thin NECK:  No jugular venous distention, waveform within normal limits, carotid upstroke brisk and symmetric, no bruits, no thyromegaly LUNGS:  Clear to auscultation bilaterally BACK:  No CVA tenderness CHEST:  Unremarkable HEART:  PMI not displaced or sustained,S1 and S2 within normal limits, no S3,  no clicks, no rubs, no murmurs, irregular ABD:  Flat, positive bowel sounds normal in frequency in pitch, no bruits, no rebound, no guarding, no midline pulsatile mass, no hepatomegaly, no splenomegaly EXT:  2 plus pulses throughout, no edema, no cyanosis no clubbing, muscle wasting   EKG:  Atrial fibrillation, rate 89, axis within normal limits, poor and her R wave progression, no acute ST-T wave changes.  04/26/2012   ASSESSMENT AND PLAN   Syncope with CVA (cerebral infarction) The patient seems to have recovered reasonably well from her events. No change in therapy is indicated.   Seizures  The patient was started on keppra in the hospital. She has had no further witnessed seizures. No change in therapy is indicated.   Atrial fibrillation:   She tolerates anticoagulation with her INR followed by Chilton Greathouse  R, MD.  Her rate seems to be well controlled and she will continue the current dose of Cardizem.

## 2012-09-20 ENCOUNTER — Emergency Department (HOSPITAL_COMMUNITY): Payer: Medicare Other

## 2012-09-20 ENCOUNTER — Inpatient Hospital Stay (HOSPITAL_COMMUNITY): Payer: Medicare Other

## 2012-09-20 ENCOUNTER — Encounter (HOSPITAL_COMMUNITY): Payer: Self-pay | Admitting: Emergency Medicine

## 2012-09-20 ENCOUNTER — Inpatient Hospital Stay (HOSPITAL_COMMUNITY)
Admission: EM | Admit: 2012-09-20 | Discharge: 2012-10-01 | DRG: 956 | Disposition: A | Discharge status: Skilled Nursing Facility | Payer: Medicare Other | Attending: Internal Medicine | Admitting: Internal Medicine

## 2012-09-20 DIAGNOSIS — S065X9A Traumatic subdural hemorrhage with loss of consciousness of unspecified duration, initial encounter: Secondary | ICD-10-CM | POA: Diagnosis present

## 2012-09-20 DIAGNOSIS — R339 Retention of urine, unspecified: Secondary | ICD-10-CM | POA: Diagnosis not present

## 2012-09-20 DIAGNOSIS — I69959 Hemiplegia and hemiparesis following unspecified cerebrovascular disease affecting unspecified side: Secondary | ICD-10-CM

## 2012-09-20 DIAGNOSIS — D62 Acute posthemorrhagic anemia: Secondary | ICD-10-CM | POA: Diagnosis not present

## 2012-09-20 DIAGNOSIS — E46 Unspecified protein-calorie malnutrition: Secondary | ICD-10-CM

## 2012-09-20 DIAGNOSIS — M899 Disorder of bone, unspecified: Secondary | ICD-10-CM | POA: Diagnosis present

## 2012-09-20 DIAGNOSIS — I4891 Unspecified atrial fibrillation: Secondary | ICD-10-CM

## 2012-09-20 DIAGNOSIS — R55 Syncope and collapse: Secondary | ICD-10-CM

## 2012-09-20 DIAGNOSIS — E785 Hyperlipidemia, unspecified: Secondary | ICD-10-CM | POA: Diagnosis present

## 2012-09-20 DIAGNOSIS — W19XXXA Unspecified fall, initial encounter: Secondary | ICD-10-CM

## 2012-09-20 DIAGNOSIS — Y92009 Unspecified place in unspecified non-institutional (private) residence as the place of occurrence of the external cause: Secondary | ICD-10-CM

## 2012-09-20 DIAGNOSIS — S065XAA Traumatic subdural hemorrhage with loss of consciousness status unknown, initial encounter: Secondary | ICD-10-CM | POA: Diagnosis present

## 2012-09-20 DIAGNOSIS — S72033A Displaced midcervical fracture of unspecified femur, initial encounter for closed fracture: Principal | ICD-10-CM | POA: Diagnosis present

## 2012-09-20 DIAGNOSIS — S0180XA Unspecified open wound of other part of head, initial encounter: Secondary | ICD-10-CM | POA: Diagnosis present

## 2012-09-20 DIAGNOSIS — R569 Unspecified convulsions: Secondary | ICD-10-CM

## 2012-09-20 DIAGNOSIS — I959 Hypotension, unspecified: Secondary | ICD-10-CM | POA: Diagnosis present

## 2012-09-20 DIAGNOSIS — E039 Hypothyroidism, unspecified: Secondary | ICD-10-CM | POA: Diagnosis present

## 2012-09-20 DIAGNOSIS — IMO0002 Reserved for concepts with insufficient information to code with codable children: Secondary | ICD-10-CM | POA: Diagnosis present

## 2012-09-20 DIAGNOSIS — Z8673 Personal history of transient ischemic attack (TIA), and cerebral infarction without residual deficits: Secondary | ICD-10-CM

## 2012-09-20 DIAGNOSIS — I10 Essential (primary) hypertension: Secondary | ICD-10-CM | POA: Diagnosis present

## 2012-09-20 DIAGNOSIS — M949 Disorder of cartilage, unspecified: Secondary | ICD-10-CM | POA: Diagnosis present

## 2012-09-20 DIAGNOSIS — I615 Nontraumatic intracerebral hemorrhage, intraventricular: Secondary | ICD-10-CM

## 2012-09-20 DIAGNOSIS — Z87891 Personal history of nicotine dependence: Secondary | ICD-10-CM

## 2012-09-20 DIAGNOSIS — E876 Hypokalemia: Secondary | ICD-10-CM | POA: Diagnosis not present

## 2012-09-20 DIAGNOSIS — S72009A Fracture of unspecified part of neck of unspecified femur, initial encounter for closed fracture: Secondary | ICD-10-CM | POA: Diagnosis present

## 2012-09-20 DIAGNOSIS — I639 Cerebral infarction, unspecified: Secondary | ICD-10-CM

## 2012-09-20 DIAGNOSIS — I6350 Cerebral infarction due to unspecified occlusion or stenosis of unspecified cerebral artery: Secondary | ICD-10-CM

## 2012-09-20 DIAGNOSIS — K5900 Constipation, unspecified: Secondary | ICD-10-CM | POA: Diagnosis not present

## 2012-09-20 DIAGNOSIS — D649 Anemia, unspecified: Secondary | ICD-10-CM

## 2012-09-20 DIAGNOSIS — G40909 Epilepsy, unspecified, not intractable, without status epilepticus: Secondary | ICD-10-CM | POA: Diagnosis present

## 2012-09-20 DIAGNOSIS — Z79899 Other long term (current) drug therapy: Secondary | ICD-10-CM

## 2012-09-20 HISTORY — DX: Major depressive disorder, single episode, unspecified: F32.9

## 2012-09-20 HISTORY — DX: Unspecified osteoarthritis, unspecified site: M19.90

## 2012-09-20 HISTORY — DX: Syncope and collapse: R55

## 2012-09-20 HISTORY — DX: Depression, unspecified: F32.A

## 2012-09-20 HISTORY — DX: Anxiety disorder, unspecified: F41.9

## 2012-09-20 HISTORY — DX: Unspecified convulsions: R56.9

## 2012-09-20 HISTORY — DX: Dizziness and giddiness: R42

## 2012-09-20 LAB — URINALYSIS, MICROSCOPIC ONLY
Ketones, ur: NEGATIVE mg/dL
Leukocytes, UA: NEGATIVE
Nitrite: NEGATIVE
Specific Gravity, Urine: 1.015 (ref 1.005–1.030)
pH: 7.5 (ref 5.0–8.0)

## 2012-09-20 LAB — POCT I-STAT, CHEM 8
BUN: 10 mg/dL (ref 6–23)
Chloride: 106 mEq/L (ref 96–112)
Creatinine, Ser: 0.7 mg/dL (ref 0.50–1.10)
Glucose, Bld: 104 mg/dL — ABNORMAL HIGH (ref 70–99)
HCT: 40 % (ref 36.0–46.0)
Potassium: 3.3 mEq/L — ABNORMAL LOW (ref 3.5–5.1)

## 2012-09-20 LAB — CBC WITH DIFFERENTIAL/PLATELET
Basophils Absolute: 0 10*3/uL (ref 0.0–0.1)
Lymphocytes Relative: 24 % (ref 12–46)
Neutro Abs: 5.4 10*3/uL (ref 1.7–7.7)
Neutrophils Relative %: 66 % (ref 43–77)
Platelets: 164 10*3/uL (ref 150–400)
RDW: 13.5 % (ref 11.5–15.5)
WBC: 8.3 10*3/uL (ref 4.0–10.5)

## 2012-09-20 LAB — PROTIME-INR: INR: 2.24 — ABNORMAL HIGH (ref 0.00–1.49)

## 2012-09-20 LAB — POCT I-STAT TROPONIN I: Troponin i, poc: 0 ng/mL (ref 0.00–0.08)

## 2012-09-20 MED ORDER — FLEET ENEMA 7-19 GM/118ML RE ENEM: 1.0000 | ENEMA | Freq: Once | RECTAL | Status: AC | PRN

## 2012-09-20 MED ORDER — ONDANSETRON HCL 4 MG PO TABS: 4.0000 mg | ORAL_TABLET | Freq: Four times a day (QID) | ORAL | Status: DC | PRN

## 2012-09-20 MED ORDER — DILTIAZEM HCL 100 MG IV SOLR
5.0000 mg/h | INTRAVENOUS | Status: DC
  Administered 2012-09-21: 10 mg/h via INTRAVENOUS
  Administered 2012-09-21 – 2012-09-22 (×2): 5 mg/h via INTRAVENOUS
  Filled 2012-09-20 (×3): qty 100

## 2012-09-20 MED ORDER — HYDROCODONE-ACETAMINOPHEN 5-325 MG PO TABS: 1.0000 | ORAL_TABLET | Freq: Four times a day (QID) | ORAL | Status: DC | PRN

## 2012-09-20 MED ORDER — POLYETHYLENE GLYCOL 3350 17 G PO PACK
17.0000 g | PACK | Freq: Every day | ORAL | Status: DC
  Administered 2012-09-20 – 2012-10-01 (×8): 17 g via ORAL
  Filled 2012-09-20 (×11): qty 1

## 2012-09-20 MED ORDER — POTASSIUM CHLORIDE CRYS ER 20 MEQ PO TBCR
40.0000 meq | EXTENDED_RELEASE_TABLET | Freq: Once | ORAL | Status: AC
  Administered 2012-09-20: 40 meq via ORAL

## 2012-09-20 MED ORDER — ATORVASTATIN CALCIUM 10 MG PO TABS
10.0000 mg | ORAL_TABLET | Freq: Every day | ORAL | Status: DC
  Administered 2012-09-20 – 2012-09-30 (×10): 10 mg via ORAL
  Filled 2012-09-20 (×13): qty 1

## 2012-09-20 MED ORDER — SODIUM CHLORIDE 0.9 % IJ SOLN
3.0000 mL | Freq: Two times a day (BID) | INTRAMUSCULAR | Status: DC
  Administered 2012-09-20 – 2012-10-01 (×15): 3 mL via INTRAVENOUS

## 2012-09-20 MED ORDER — MIRTAZAPINE 15 MG PO TABS
15.0000 mg | ORAL_TABLET | Freq: Every day | ORAL | Status: DC
  Administered 2012-09-20 – 2012-09-30 (×11): 15 mg via ORAL
  Filled 2012-09-20 (×14): qty 1

## 2012-09-20 MED ORDER — LEVOTHYROXINE SODIUM 50 MCG PO TABS
50.0000 ug | ORAL_TABLET | Freq: Every day | ORAL | Status: DC
  Administered 2012-09-21 – 2012-10-01 (×11): 50 ug via ORAL
  Filled 2012-09-20 (×16): qty 1

## 2012-09-20 MED ORDER — LIDOCAINE-EPINEPHRINE 1 %-1:100000 IJ SOLN: 20.0000 mL | Freq: Once | INTRAMUSCULAR | Status: DC

## 2012-09-20 MED ORDER — LEVETIRACETAM 500 MG PO TABS
500.0000 mg | ORAL_TABLET | Freq: Two times a day (BID) | ORAL | Status: DC
  Administered 2012-09-20 – 2012-10-01 (×22): 500 mg via ORAL
  Filled 2012-09-20 (×25): qty 1

## 2012-09-20 MED ORDER — CEFAZOLIN SODIUM-DEXTROSE 2-3 GM-% IV SOLR
2.0000 g | INTRAVENOUS | Status: AC
  Administered 2012-09-21: 2 g via INTRAVENOUS
  Filled 2012-09-20: qty 50

## 2012-09-20 MED ORDER — SODIUM CHLORIDE 0.9 % IV SOLN
INTRAVENOUS | Status: DC
  Administered 2012-09-20: 10:00:00 via INTRAVENOUS

## 2012-09-20 MED ORDER — SENNOSIDES-DOCUSATE SODIUM 8.6-50 MG PO TABS
1.0000 | ORAL_TABLET | Freq: Every evening | ORAL | Status: DC | PRN
  Filled 2012-09-20: qty 1

## 2012-09-20 MED ORDER — VITAMIN K1 10 MG/ML IJ SOLN
2.0000 mg | Freq: Once | INTRAVENOUS | Status: AC
  Administered 2012-09-20: 2 mg via INTRAVENOUS
  Filled 2012-09-20 (×2): qty 0.2

## 2012-09-20 MED ORDER — MORPHINE SULFATE 2 MG/ML IJ SOLN: 0.5000 mg | INTRAMUSCULAR | Status: DC | PRN

## 2012-09-20 MED ORDER — GUAIFENESIN-DM 100-10 MG/5ML PO SYRP
5.0000 mL | ORAL_SOLUTION | ORAL | Status: DC | PRN
  Filled 2012-09-20: qty 5

## 2012-09-20 MED ORDER — MORPHINE SULFATE 2 MG/ML IJ SOLN: 1.0000 mg | INTRAMUSCULAR | Status: DC | PRN

## 2012-09-20 MED ORDER — DOCUSATE SODIUM 100 MG PO CAPS
100.0000 mg | ORAL_CAPSULE | Freq: Two times a day (BID) | ORAL | Status: DC
  Administered 2012-09-20 – 2012-09-25 (×8): 100 mg via ORAL
  Filled 2012-09-20 (×11): qty 1

## 2012-09-20 MED ORDER — FENTANYL CITRATE 0.05 MG/ML IJ SOLN
100.0000 ug | Freq: Once | INTRAMUSCULAR | Status: AC
  Administered 2012-09-20: 100 ug via INTRAVENOUS
  Filled 2012-09-20: qty 2

## 2012-09-20 MED ORDER — CALCIUM POLYCARBOPHIL 625 MG PO TABS
625.0000 mg | ORAL_TABLET | Freq: Every day | ORAL | Status: DC
  Administered 2012-09-22 – 2012-09-30 (×8): 625 mg via ORAL
  Filled 2012-09-20 (×12): qty 1

## 2012-09-20 MED ORDER — ALBUTEROL SULFATE (5 MG/ML) 0.5% IN NEBU: 2.5000 mg | INHALATION_SOLUTION | RESPIRATORY_TRACT | Status: DC | PRN

## 2012-09-20 MED ORDER — ALUM & MAG HYDROXIDE-SIMETH 200-200-20 MG/5ML PO SUSP: 30.0000 mL | Freq: Four times a day (QID) | ORAL | Status: DC | PRN

## 2012-09-20 MED ORDER — ADULT MULTIVITAMIN W/MINERALS CH
1.0000 | ORAL_TABLET | Freq: Every day | ORAL | Status: DC
  Administered 2012-09-22 – 2012-09-30 (×8): 1 via ORAL
  Filled 2012-09-20 (×12): qty 1

## 2012-09-20 MED ORDER — ONDANSETRON HCL 4 MG/2ML IJ SOLN: 4.0000 mg | Freq: Four times a day (QID) | INTRAMUSCULAR | Status: DC | PRN

## 2012-09-20 MED ORDER — POLYETHYLENE GLYCOL 3350 17 G PO PACK
17.0000 g | PACK | Freq: Every day | ORAL | Status: DC | PRN
  Filled 2012-09-20: qty 1

## 2012-09-20 MED ORDER — SODIUM CHLORIDE 0.9 % IV SOLN
INTRAVENOUS | Status: AC
  Administered 2012-09-20: 18:00:00 via INTRAVENOUS

## 2012-09-20 MED ORDER — POTASSIUM CHLORIDE CRYS ER 20 MEQ PO TBCR
EXTENDED_RELEASE_TABLET | ORAL | Status: AC
  Filled 2012-09-20: qty 2

## 2012-09-20 MED ORDER — POLYETHYLENE GLYCOL 3350 17 GM/SCOOP PO POWD
17.0000 g | Freq: Every day | ORAL | Status: DC
  Filled 2012-09-20: qty 255

## 2012-09-20 MED ORDER — LIDOCAINE-EPINEPHRINE 2 %-1:100000 IJ SOLN
20.0000 mL | Freq: Once | INTRAMUSCULAR | Status: DC
  Filled 2012-09-20: qty 20

## 2012-09-20 MED ORDER — BISACODYL 5 MG PO TBEC
5.0000 mg | DELAYED_RELEASE_TABLET | Freq: Every day | ORAL | Status: DC | PRN
  Administered 2012-09-25: 5 mg via ORAL
  Filled 2012-09-20: qty 1

## 2012-09-20 MED ORDER — HYDROCODONE-ACETAMINOPHEN 5-325 MG PO TABS
1.0000 | ORAL_TABLET | ORAL | Status: DC | PRN
  Administered 2012-09-20: 2 via ORAL
  Administered 2012-09-21: 1 via ORAL
  Filled 2012-09-20: qty 1
  Filled 2012-09-20: qty 2

## 2012-09-20 NOTE — Consult Note (Signed)
Reason for Consult:Left Hip pain. Referring Physician: Dr. Radford Pax Consulting Physician:Elisabeth Strom E  Orthopedic Diagnosis:1)Left displaced femoral neck fracture 2)Atrial fibrillation, coumadin anticoagulation for high risk and past history of embolic event with CVA. 3)Left hemiparesis due to right hemisphere stroke in the past 6 months. 4) HTN 5) Seizure disorder  [name]:[id number] J Goldman is an 79 y.o. female with past history of atrial fibrillation and right-sided CVA with left hemiparesis. [name] reports beginning to make [name] breakfast this morning when [name] felt sudden warmth and a feeling associated was maintained and subsequently did fall landing on [name] left hip and hitting [name] that left hand and face. Brought via ambulance to [facility] emergency room where x-rays demonstrated a left displaced transcervical hip fracture. [name] has a left facial laceration and has undergone suture repair here in the emergency room. [name] husband is a patient I have followed in my office and he requested my consultation. [name] has no previous history of left hip pain and reports that [name] primary care physician is told [name] that [name] has excellent bone strength. [name] has fallen in the past and at that time [name] was placed on the keppra for seizures.   Past Medical History  Diagnosis Date  . Dysphagia   . Atrial fibrillation     a. Dx 05/2011 at time of CVA.  Marland Kitchen Hyperlipidemia   . HTN (hypertension)   . Hypothyroidism   . Stroke     a. 05/2011 right MCA CVA in setting of new onset afib s/p thrombolysis/thrombolytics. b. Recurrent CVA 10/2011 in setting of subtherap.INR.  Marland Kitchen Pneumonia     years ago  . Seizures     a. At time of 10/2011 CVA with slowing seen on EEG.    Past Surgical History  Procedure Date  . Back surgery   . Abdominal hysterectomy   . Pleural scarification     Family History  Problem Relation Age of Onset  . CAD Neg Hx     Social History:  reports that [name] quit smoking about 32 years ago. [name] has  never used smokeless tobacco. [name] reports that [name] drinks alcohol. [name] reports that [name] does not use illicit drugs.  Allergies: No Known Allergies  Medications: Prior to Admission:  (Not in a hospital admission)  Results for orders placed during the hospital encounter of 09/20/12 (from the past 48 hour(s))  PROTIME-INR     Status: Abnormal   Collection Time   09/20/12  9:43 AM      Component Value Range Comment   Prothrombin Time 23.8 (*) 11.6 - 15.2 seconds    INR 2.24 (*) 0.00 - 1.49   CBC WITH DIFFERENTIAL     Status: Normal   Collection Time   09/20/12  9:43 AM      Component Value Range Comment   WBC 8.3  4.0 - 10.5 K/uL    RBC 4.43  3.87 - 5.11 MIL/uL    Hemoglobin 13.1  12.0 - 15.0 g/dL    HCT 65.7  84.6 - 96.2 %    MCV 91.6  78.0 - 100.0 fL    MCH 29.6  26.0 - 34.0 pg    MCHC 32.3  30.0 - 36.0 g/dL    RDW 95.2  84.1 - 32.4 %    Platelets 164  150 - 400 K/uL    Neutrophils Relative 66  43 - 77 %    Neutro Abs 5.4  1.7 - 7.7 K/uL    Lymphocytes Relative 24  12 - 46 %    Lymphs Abs 2.0  0.7 - 4.0 K/uL    Monocytes Relative 7  3 - 12 %    Monocytes Absolute 0.6  0.1 - 1.0 K/uL    Eosinophils Relative 3  0 - 5 %    Eosinophils Absolute 0.3  0.0 - 0.7 K/uL    Basophils Relative 0  0 - 1 %    Basophils Absolute 0.0  0.0 - 0.1 K/uL   URINALYSIS, MICROSCOPIC ONLY     Status: Abnormal   Collection Time   09/20/12  9:46 AM      Component Value Range Comment   Color, Urine YELLOW  YELLOW    APPearance CLOUDY (*) CLEAR    Specific Gravity, Urine 1.015  1.005 - 1.030    pH 7.5  5.0 - 8.0    Glucose, UA NEGATIVE  NEGATIVE mg/dL    Hgb urine dipstick NEGATIVE  NEGATIVE    Bilirubin Urine NEGATIVE  NEGATIVE    Ketones, ur NEGATIVE  NEGATIVE mg/dL    Protein, ur NEGATIVE  NEGATIVE mg/dL    Urobilinogen, UA 0.2  0.0 - 1.0 mg/dL    Nitrite NEGATIVE  NEGATIVE    Leukocytes, UA NEGATIVE  NEGATIVE    WBC, UA 0-2  <3 WBC/hpf    RBC / HPF 0-2  <3 RBC/hpf    Bacteria, UA RARE  RARE     Squamous Epithelial / LPF RARE  RARE    Urine-Other AMORPHOUS URATES/PHOSPHATES     POCT I-STAT TROPONIN I     Status: Normal   Collection Time   09/20/12  9:53 AM      Component Value Range Comment   Troponin i, poc 0.00  0.00 - 0.08 ng/mL    Comment 3            POCT I-STAT, CHEM 8     Status: Abnormal   Collection Time   09/20/12  2:03 PM      Component Value Range Comment   Sodium 142  135 - 145 mEq/L    Potassium 3.3 (*) 3.5 - 5.1 mEq/L    Chloride 106  96 - 112 mEq/L    BUN 10  6 - 23 mg/dL    Creatinine, Ser 1.61  0.50 - 1.10 mg/dL    Glucose, Bld 096 (*) 70 - 99 mg/dL    Calcium, Ion 0.45 (*) 1.13 - 1.30 mmol/L    TCO2 24  0 - 100 mmol/L    Hemoglobin 13.6  12.0 - 15.0 g/dL    HCT 40.9  81.1 - 91.4 %     Dg Hip Complete Left  09/20/2012  *RADIOLOGY REPORT*  Clinical Data: Left hip pain  LEFT HIP - COMPLETE 2+ VIEW  Comparison: None.  Findings: No acute displaced transcervical fracture through the mid left femoral neck.  The distal fracture fragment is displaced superiorly with respect to the femoral head by one half shaft width.  The remainder of the bony pelvis appears intact.  The femoral head is located on cross-table lateral view.  Unremarkable bowel gas pattern.  IMPRESSION:  Acute transcervical fracture of the left femoral neck.  The femur is displaced superiorly by one half shaft width with respect to the femoral head.   Original Report Authenticated By: Malachy Moan, M.D.    Ct Head Wo Contrast  09/20/2012  *RADIOLOGY REPORT*  Clinical Data:  Syncopal episode resulting in a fall, hitting [name] head with subsequent headache.  CT HEAD WITHOUT CONTRAST CT MAXILLOFACIAL WITHOUT CONTRAST CT CERVICAL SPINE WITHOUT CONTRAST  Technique:  Multidetector CT imaging of the head, cervical spine, and maxillofacial structures were performed using the standard protocol without intravenous contrast. Multiplanar CT image reconstructions of the cervical spine and maxillofacial structures were  also generated.  Comparison:  Previous examinations.  CT HEAD  Findings: No significant change in an old left middle cerebral artery distribution infarct with associated midline shift to the right.  An old right caudate body lacunar infarct is again demonstrated.  There is a left facial laceration and associated soft tissue blood and air.  No skull fracture, intracranial hemorrhage or paranasal sinus air-fluid levels are seen.  There is minimal left maxillary sinus mucosal thickening.  IMPRESSION:  1.  No skull fracture or intracranial hemorrhage. 2.  Stable atrophy and old infarcts. 3.  Left facial laceration.  CT MAXILLOFACIAL  Findings:  Left facial soft tissue laceration with associated blood and air in the subcutaneous fat.  No fractures or paranasal sinus air-fluid levels.  IMPRESSION: Left facial soft tissue laceration without fracture.  CT CERVICAL SPINE  Findings:   Multilevel degenerative changes are stable.  No prevertebral soft tissue swelling, fractures or subluxations. Stable right apical pleural thickening and surgical staples.  IMPRESSION:  1.  No fracture or subluxation. 2.  Stable degenerative changes.   Original Report Authenticated By: Beckie Salts, M.D.    Ct Cervical Spine Wo Contrast  09/20/2012  *RADIOLOGY REPORT*  Clinical Data:  Syncopal episode resulting in a fall, hitting [name] head with subsequent headache.  CT HEAD WITHOUT CONTRAST CT MAXILLOFACIAL WITHOUT CONTRAST CT CERVICAL SPINE WITHOUT CONTRAST  Technique:  Multidetector CT imaging of the head, cervical spine, and maxillofacial structures were performed using the standard protocol without intravenous contrast. Multiplanar CT image reconstructions of the cervical spine and maxillofacial structures were also generated.  Comparison:  Previous examinations.  CT HEAD  Findings: No significant change in an old left middle cerebral artery distribution infarct with associated midline shift to the right.  An old right caudate body lacunar  infarct is again demonstrated.  There is a left facial laceration and associated soft tissue blood and air.  No skull fracture, intracranial hemorrhage or paranasal sinus air-fluid levels are seen.  There is minimal left maxillary sinus mucosal thickening.  IMPRESSION:  1.  No skull fracture or intracranial hemorrhage. 2.  Stable atrophy and old infarcts. 3.  Left facial laceration.  CT MAXILLOFACIAL  Findings:  Left facial soft tissue laceration with associated blood and air in the subcutaneous fat.  No fractures or paranasal sinus air-fluid levels.  IMPRESSION: Left facial soft tissue laceration without fracture.  CT CERVICAL SPINE  Findings:   Multilevel degenerative changes are stable.  No prevertebral soft tissue swelling, fractures or subluxations. Stable right apical pleural thickening and surgical staples.  IMPRESSION:  1.  No fracture or subluxation. 2.  Stable degenerative changes.   Original Report Authenticated By: Beckie Salts, M.D.    Dg Chest Port 1 View  09/20/2012  *RADIOLOGY REPORT*  Clinical Data: Syncope resulting in a fall.  PORTABLE CHEST - 1 VIEW  Comparison: 11/15/2011.  Findings: Stable enlarged cardiac silhouette and hyperexpanded lungs.  The lungs remain clear.  Right apical surgical staples and pleural thickening are unchanged.  Mild left apical pleural thickening is again demonstrated.  Mild scoliosis.  IMPRESSION: No acute abnormality.  Stable cardiomegaly and changes of COPD.   Original Report Authenticated By: Beckie Salts, M.D.  Ct Maxillofacial Wo Cm  09/20/2012  *RADIOLOGY REPORT*  Clinical Data:  Syncopal episode resulting in a fall, hitting [name] head with subsequent headache.  CT HEAD WITHOUT CONTRAST CT MAXILLOFACIAL WITHOUT CONTRAST CT CERVICAL SPINE WITHOUT CONTRAST  Technique:  Multidetector CT imaging of the head, cervical spine, and maxillofacial structures were performed using the standard protocol without intravenous contrast. Multiplanar CT image reconstructions of  the cervical spine and maxillofacial structures were also generated.  Comparison:  Previous examinations.  CT HEAD  Findings: No significant change in an old left middle cerebral artery distribution infarct with associated midline shift to the right.  An old right caudate body lacunar infarct is again demonstrated.  There is a left facial laceration and associated soft tissue blood and air.  No skull fracture, intracranial hemorrhage or paranasal sinus air-fluid levels are seen.  There is minimal left maxillary sinus mucosal thickening.  IMPRESSION:  1.  No skull fracture or intracranial hemorrhage. 2.  Stable atrophy and old infarcts. 3.  Left facial laceration.  CT MAXILLOFACIAL  Findings:  Left facial soft tissue laceration with associated blood and air in the subcutaneous fat.  No fractures or paranasal sinus air-fluid levels.  IMPRESSION: Left facial soft tissue laceration without fracture.  CT CERVICAL SPINE  Findings:   Multilevel degenerative changes are stable.  No prevertebral soft tissue swelling, fractures or subluxations. Stable right apical pleural thickening and surgical staples.  IMPRESSION:  1.  No fracture or subluxation. 2.  Stable degenerative changes.   Original Report Authenticated By: Beckie Salts, M.D.     @ROS @ Blood pressure 127/76, pulse 115, temperature 98 F (36.7 C), temperature source Oral, resp. rate 15, SpO2 97.00%.  Orthopaedic Exam: This is a 79 year old female thin well-developed in mild distress. [name] has pain when [name] moves the left lower extremity. Has obvious bruising about the left orbit and suture repair of a vertical laceration along the lateral orbit area and maxillary area. There is periorbital edema and ecchymosis as well as subconjunctival ecchymosis. The left leg is shortened by an inch and a half and externally rotated. Pulses in both lower extremities are normal motor function is normal in both legs. Plain radiographs were reviewed and demonstrate a varus  angulation and medial displacement of left transcervical hip fracture.  Assessment/Plan: 1) closed varus angulated displaced left femoral neck fracture 2) atrial fibrillation on chronic coumadinization with INR 2.13 3) history of embolic CVA right hemisphere with left hemiparesis 4) hypertension  Plan: This patient was seen with Dr. Antoine Poche and shows elevated INR which precludes immediate surgical treatment of [name] left hip fracture. Dr. Antoine Poche indicates he will reverse [name] anticoagulation and [name] should then be able to undergo left hip pore coated hemiarthroplasty procedure tomorrow. We'll plan to schedule left hip or coated hemiarthroplasty tomorrow afternoon if the patient's coagulation studies are showing INR less than 1.7. Postoperatively will plan to anticoagulate immediately to diminish risk of further embolic risk due to atrial fibrillation. This patient is admitted to the medicine service and I will review studies in the morning prior to scheduling surgery for that afternoon. [name] may have a clear liquid diet for breakfast before 9 AM.   Caleb Decock E 09/20/2012, 5:06 PM

## 2012-09-20 NOTE — ED Notes (Signed)
Cardiology in room with husband at this time

## 2012-09-20 NOTE — ED Notes (Signed)
PA-C in room at this time suturing left brow.

## 2012-09-20 NOTE — Progress Notes (Signed)
I received call concerning [name] with a left hip fracture and will see her.

## 2012-09-20 NOTE — Consult Note (Signed)
CARDIOLOGY CONSULT NOTE  Patient ID: [name], MRN: [id number], DOB/AGE: August 14, 1931 79 y.o. Admit date: 09/20/2012   Date of Consult: 09/20/2012 Primary Physician: [name], MD Primary Cardiologist: Aimar Borghi  Chief Complaint: fall, ?passed out Reason for Consult: syncope, cardiac clearance  HPI: [name] is a 79 y/o F with history of HTN, atrial fibrillation first recognized with CVA 05/2011 s/p thrombolysis/thrombectomy, recurrent CVA 10/2011 in setting of subtherapeutic INR with seizures at that time/slowing seen on EEG. Echo with normal EF 55-60%, mild MR and negative carotid dopplers at that time. She presented to [facility] today s/p fall and possible syncopal spell. She was in her usual state of health today and felt well. She was fixing breakfast and went to grab a napkin, but began to feel very hot and strange for a few seconds. Her husband heard a "bang" and walked into the kitchen to find her on the floor. No seizure activity, b/b incontinence. She was mostly alert when he reached her but they believed she passed out a second or two. EMS was called. She has had dizziness in the past, but did not have any with this episode. No CP, palpitations, SOB, visual changes, or anything else unusual preceding this event. She exercises 5 days a week at her living facility without any recent symptoms including CP, DOE, dizziness. She has a remote history of syncope but none in several years. No recent fever, nausea, vomiting, diarrhea, or bleeding. In the ER, she was found to have a L hip fracture. CT head negative for fracture or intracranial hemmorhage; stable old infarcts noted. CXR no acute dz. Labs significant for INR 2.24, ?K 3.3, troponin neg x1, o/w unremarkable. Keppra level pending. She reports neck pain at present but no cardiac complaints. Vitals stable except increased HR in 110-120 range, afib. Cardizem gtt has been ordered by primary team (not yet initiated). 2mg  IV  Viitamin K has also been ordered.  Past Medical History  Diagnosis Date  . Dysphagia   . Atrial fibrillation     a. Dx 05/2011 at time of CVA.  Marland Kitchen Hyperlipidemia   . HTN (hypertension)   . Hypothyroidism   . Stroke     a. 05/2011 right MCA CVA in setting of new onset afib s/p thrombolysis/thrombolytics. b. Recurrent CVA 10/2011 in setting of subtherap.INR.  Marland Kitchen Pneumonia     years ago  . Seizures     a. At time of 10/2011 CVA with slowing seen on EEG.      Most Recent Cardiac Studies: 2D Echo 11/16/11 Study Conclusions - Left ventricle: The cavity size was normal. Wall thickness was normal. Systolic function was normal. The estimated ejection fraction was in the range of 55% to 60%. Wall motion was normal; there were no regional wall motion abnormalities. The study is not technically sufficient to allow evaluation of LV diastolic function. - Mitral valve: Mild regurgitation. - Pericardium, extracardiac: A trivial pericardial effusion was identified.  Carotid dopp 11/16/2011 Summary: No significant extracranial carotid artery stenosis demonstrated. Vertebrals are patent with antegrade flow.   EEG 10/2011: EKG: Single-channel EKG monitoring detected an irregular rhythm.  IMPRESSION: This was an abnormal awake and drowsy EEG due to presence  of continuous right anterior and mid-temporal slowing. The finding maybe suggestive  of an underlying structural abnormality of any etiology in that head region.  Single channel of EKG monitoring detected an irregular rhythm. If clinically  warranted, clinical correlation is suggested.   Surgical History:  Past  Surgical History  Procedure Date  . Back surgery   . Abdominal hysterectomy   . Pleural scarification      Home Meds: Prior to Admission medications   Medication Sig Start Date End Date Taking? Authorizing Provider  atorvastatin (LIPITOR) 10 MG tablet Take 1 tablet (10 mg total) by mouth daily at 6 PM. 11/17/11 11/16/12 Yes Nishant  Dhungel, MD  Calcium Carbonate (CALTRATE 600 PO) Take 1 tablet by mouth daily at 12 noon.    Yes Historical Provider, MD  diltiazem (CARTIA XT) 240 MG 24 hr capsule Take 240 mg by mouth daily.   Yes Historical Provider, MD  Docusate Calcium (STOOL SOFTENER PO) Take 1 capsule by mouth at bedtime.    Yes Historical Provider, MD  levETIRAcetam (KEPPRA) 500 MG tablet Take 1 tablet (500 mg total) by mouth 2 (two) times daily. 11/17/11 11/16/12 Yes Nishant Dhungel, MD  levothyroxine (SYNTHROID, LEVOTHROID) 50 MCG tablet Take 50 mcg by mouth every morning.    Yes Historical Provider, MD  mirtazapine (REMERON) 15 MG tablet Take 15 mg by mouth at bedtime.   Yes Historical Provider, MD  Multiple Vitamin (MULITIVITAMIN WITH MINERALS) TABS Take 1 tablet by mouth daily at 12 noon.   Yes Historical Provider, MD  polycarbophil (FIBERCON) 625 MG tablet Take 625 mg by mouth daily at 12 noon.   Yes Historical Provider, MD  polyethylene glycol powder (GLYCOLAX/MIRALAX) powder Take 17 g by mouth every morning.   Yes Historical Provider, MD  warfarin (COUMADIN) 5 MG tablet Take 1.5 tablets (7.5 mg total) by mouth every evening. Takes daily at 1700 11/17/11  Yes Nishant Dhungel, MD    Inpatient Medications:     . fentaNYL  100 mcg Intravenous Once  . lidocaine-EPINEPHrine  20 mL Other Once    Allergies: No Known Allergies  History   Social History  . Marital Status: Single    Spouse Name: N/A    Number of Children: N/A  . Years of Education: N/A   Occupational History  . Not on file.   Social History Main Topics  . Smoking status: Former Smoker    Quit date: 11/16/1979  . Smokeless tobacco: Never Used  . Alcohol Use: Yes     Comment: social - 1x/week  . Drug Use: No  . Sexually Active: No   Other Topics Concern  . Not on file   Social History Narrative   She lives with her husband.  Children live out of the  area.  Two-level home with bedroom upstairs, multiple steps to entrance  of home.       Family History  Problem Relation Age of Onset  . CAD Neg Hx     Review of Systems: General: negative for chills, fever, night sweats or weight changes.  Cardiovascular: see above. No lee, orthopnea, pnd Dermatological: negative for rash Respiratory: negative for cough or wheezing Urologic: negative for hematuria Abdominal: negative for nausea, vomiting, diarrhea, bright red blood per rectum, melena, or hematemesis Neurologic: see above All other systems reviewed and are otherwise negative except as noted above.  Labs: Troponin neg x 1 Lab Results  Component Value Date   WBC 8.3 09/20/2012   HGB 13.6 09/20/2012   HCT 40.0 09/20/2012   MCV 91.6 09/20/2012   PLT 164 09/20/2012     Lab 09/20/12 1403  NA 142  K 3.3*  CL 106  CO2 --  BUN 10  CREATININE 0.70  CALCIUM --  PROT --  BILITOT --  ALKPHOS --  ALT --  AST --  GLUCOSE 104*    Radiology/Studies:  Dg Hip Complete Left 09/20/2012  *RADIOLOGY REPORT*  Clinical Data: Left hip pain  LEFT HIP - COMPLETE 2+ VIEW  Comparison: None.  Findings: No acute displaced transcervical fracture through the mid left femoral neck.  The distal fracture fragment is displaced superiorly with respect to the femoral head by one half shaft width.  The remainder of the bony pelvis appears intact.  The femoral head is located on cross-table lateral view.  Unremarkable bowel gas pattern.  IMPRESSION:  Acute transcervical fracture of the left femoral neck.  The femur is displaced superiorly by one half shaft width with respect to the femoral head.   Original Report Authenticated By: Malachy Moan, M.D.    Ct Cervical Spine Wo Contrast 09/20/2012  *RADIOLOGY REPORT*  Clinical Data:  Syncopal episode resulting in a fall, hitting her head with subsequent headache.  CT HEAD WITHOUT CONTRAST CT MAXILLOFACIAL WITHOUT CONTRAST CT CERVICAL SPINE WITHOUT CONTRAST  Technique:  Multidetector CT imaging of the head, cervical spine, and maxillofacial structures were  performed using the standard protocol without intravenous contrast. Multiplanar CT image reconstructions of the cervical spine and maxillofacial structures were also generated.  Comparison:  Previous examinations.  CT HEAD  Findings: No significant change in an old left middle cerebral artery distribution infarct with associated midline shift to the right.  An old right caudate body lacunar infarct is again demonstrated.  There is a left facial laceration and associated soft tissue blood and air.  No skull fracture, intracranial hemorrhage or paranasal sinus air-fluid levels are seen.  There is minimal left maxillary sinus mucosal thickening.  IMPRESSION:  1.  No skull fracture or intracranial hemorrhage. 2.  Stable atrophy and old infarcts. 3.  Left facial laceration.  CT MAXILLOFACIAL  Findings:  Left facial soft tissue laceration with associated blood and air in the subcutaneous fat.  No fractures or paranasal sinus air-fluid levels.  IMPRESSION: Left facial soft tissue laceration without fracture.  CT CERVICAL SPINE  Findings:   Multilevel degenerative changes are stable.  No prevertebral soft tissue swelling, fractures or subluxations. Stable right apical pleural thickening and surgical staples.  IMPRESSION:  1.  No fracture or subluxation. 2.  Stable degenerative changes.   Original Report Authenticated By: Beckie Salts, M.D.    Dg Chest Port 1 View 09/20/2012  *RADIOLOGY REPORT*  Clinical Data: Syncope resulting in a fall.  PORTABLE CHEST - 1 VIEW  Comparison: 11/15/2011.  Findings: Stable enlarged cardiac silhouette and hyperexpanded lungs.  The lungs remain clear.  Right apical surgical staples and pleural thickening are unchanged.  Mild left apical pleural thickening is again demonstrated.  Mild scoliosis.  IMPRESSION: No acute abnormality.  Stable cardiomegaly and changes of COPD.   Original Report Authenticated By: Beckie Salts, M.D.    EKG: afib 102bpm subtle inferior TW inversion, right axis deviation,  no acute ST changes; no significant change from prior  Physical Exam: Blood pressure 127/76, pulse 115, temperature 98 F (36.7 C), temperature source Oral, resp. rate 15, SpO2 97.00%. General: Frail elderly WF in no acute distress. Head: L eye has marked periobital ecchymosis and swelling (L eye swollen shut), L forehead lac s/p suturing with oozing, R sclera non-icteric, no xanthomas, nares are without discharge.  Neck: Negative for carotid bruits. JVD not elevated. Lungs: Clear bilaterally to auscultation without wheezes, rales, or rhonchi. Breathing is unlabored. Heart: Irregular rhythm, increased rate with S1 S2. No  murmurs, rubs, or gallops appreciated. Abdomen: Soft, non-tender, non-distended with normoactive bowel sounds. No hepatomegaly. No rebound/guarding. No obvious abdominal masses. Msk:  Strength and tone appear normal for age. Extremities: No clubbing or cyanosis. No edema.  Distal pedal pulses are 2+ and equal bilaterally. Neuro: Alert and oriented X 3. Facial asymmetry noted related to periorbital injury. Moves all extremities spontaneously. Psych:  Responds to questions appropriately with a normal affect.   Assessment and Plan:   1. Possible syncopal spell 2. L hip fracture 3. Known atrial fibrillation presenting with elevated rates 4. CVA 05/2011 (dx with afib at that time), 10/2011 (subtherapeutic INR) 5. Chronic anticoagulation with Coumadin 6. Seizures diagnosed at time of CVA 10/2011 with abnl EEG 7. HTN, controlled 8. Hyperlipidemia 9. Hypokalemia  Patient seen and examined with myself and Dr. Antoine Poche. Etiology of syncope unclear. EKG is not particularly revealing. No recent anginal symptoms but will cycle enzymes. Would watch on telemetry extensively this hospitalization. We do not feel she needs a repeat echocardiogram as she had just under a year ago. Has had normal carotid dopplers this year. Agree with IV diltiazem and IV vitamin K. DC coumadin for now but will  have low threshold to bridge with heparin pre/post-procedure. Will need to resume anticoagulation as soon as OK with ortho surgery given afib with history of recurrent CVA. Will order KCl and f/u lytes in AM. See below for additional thoughts.  Signed, Ronie Spies PA-C 09/20/2012, 4:21 PM  History and all data above reviewed.  Patient examined.  I agree with the findings as above.  She is very frail but was doing OK.  She thinks that her syncope today is similar to previous problems that she had with vertigo.   The patient exam reveals NFA:[id number]  ,  Lungs: Decreased breath sounds  ,  ION:GEXBMWUX bowel sounds, no rebound no guarding, Ext Multiple bruises and obvious leg length discrepancy.  .  All available labs, radiology testing, previous records reviewed. Agree with documented assessment and plan. Obviously she cannot be on warfarin now.  I will have a low threshold for starting bridging heparin in the days to come because of her past history of CVA while on therapeutic warfarin.  For now she has been given IV Vit K and we can check an INR again tomorrow.  I agree with IV Dilt for rate control for now.    Rollene Rotunda  6:35 PM  09/20/2012

## 2012-09-20 NOTE — ED Provider Notes (Addendum)
History     CSN: [id number]  Arrival date & time 09/20/12  0904   First MD Initiated Contact with Patient 09/20/12 [phone]      Chief Complaint  Patient presents with  . Fall     HPI Pt brought in from [facility] after syncopal episode. Witness on scene reports pt hit head. Pt c/o left hip pain, lt elbow skin tear, bruising swelling to left brow/head. 12 lead by EMS A-Fib, pt on coumadin therapy.  Patient denies chest pain.  Chief complaint is pain and deformity left hip.  Past Medical History  Diagnosis Date  . Cerebral infarction   . Dysphagia   . Atrial fibrillation   . Hyperlipidemia   . HTN (hypertension)   . Hypothyroidism   . Stroke   . Pneumonia     years ago    Past Surgical History  Procedure Date  . Back surgery   . Abdominal hysterectomy   . Pleural scarification     History reviewed. No pertinent family history.  History  Substance Use Topics  . Smoking status: Former Smoker    Quit date: 11/16/1979  . Smokeless tobacco: Never Used  . Alcohol Use: Yes     Comment: social    OB History    Grav Para Term Preterm Abortions TAB SAB Ect Mult Living                  Review of Systems All other systems reviewed and are negative Allergies  Review of patient's allergies indicates no known allergies.  Home Medications   Current Outpatient Rx  Name  Route  Sig  Dispense  Refill  . ATORVASTATIN CALCIUM 10 MG PO TABS   Oral   Take 1 tablet (10 mg total) by mouth daily at 6 PM.   30 tablet   2   . CALTRATE 600 PO   Oral   Take 1 tablet by mouth daily at 12 noon.          Marland Kitchen DILTIAZEM HCL ER COATED BEADS 240 MG PO CP24   Oral   Take 240 mg by mouth daily.         . STOOL SOFTENER PO   Oral   Take 1 capsule by mouth at bedtime.          Marland Kitchen LEVETIRACETAM 500 MG PO TABS   Oral   Take 1 tablet (500 mg total) by mouth 2 (two) times daily.   60 tablet   3   . LEVOTHYROXINE SODIUM 50 MCG PO TABS   Oral   Take 50 mcg by  mouth every morning.          Marland Kitchen MIRTAZAPINE 15 MG PO TABS   Oral   Take 15 mg by mouth at bedtime.         . ADULT MULTIVITAMIN W/MINERALS CH   Oral   Take 1 tablet by mouth daily at 12 noon.         Marland Kitchen CALCIUM POLYCARBOPHIL 625 MG PO TABS   Oral   Take 625 mg by mouth daily at 12 noon.         Marland Kitchen POLYETHYLENE GLYCOL 3350 PO POWD   Oral   Take 17 g by mouth every morning.         . WARFARIN SODIUM 5 MG PO TABS   Oral   Take 1.5 tablets (7.5 mg total) by mouth every evening. Takes daily at 1700   30  tablet   0     BP 124/67  Pulse 116  Temp 98 F (36.7 C) (Oral)  Resp 18  SpO2 97%  Physical Exam  Nursing note and vitals reviewed. Constitutional: She is oriented to person, place, and time. She appears well-developed and well-nourished. She appears distressed.  HENT:  Head: Normocephalic.    Eyes: Pupils are equal, round, and reactive to light.  Neck: Normal range of motion.  Cardiovascular: Normal rate and intact distal pulses.   Pulmonary/Chest: Stridor present. No respiratory distress.  Abdominal: Normal appearance. She exhibits no distension.  Musculoskeletal:       Left hip: She exhibits decreased range of motion, tenderness and deformity (Hip in flexion with pain to movement).       Legs: Neurological: She is alert and oriented to person, place, and time. No cranial nerve deficit.  Skin: Skin is warm and dry. No rash noted.  Psychiatric: She has a normal mood and affect. Her behavior is normal.    ED Course  Procedures (including critical care time)  Date: 09/20/2012  Rate: 101  Rhythm: Atrial fibrillation (old)  QRS Axis: normal  Intervals: normal  ST/T Wave abnormalities: normal  Conduction Disutrbances: none  Narrative Interpretation: Abnormal EKG     Labs Reviewed  PROTIME-INR - Abnormal; Notable for the following:    Prothrombin Time 23.8 (*)     INR 2.24 (*)     All other components within normal limits  URINALYSIS, MICROSCOPIC  ONLY - Abnormal; Notable for the following:    APPearance CLOUDY (*)     All other components within normal limits  POCT I-STAT, CHEM 8 - Abnormal; Notable for the following:    Potassium 3.3 (*)     Glucose, Bld 104 (*)     Calcium, Ion 1.12 (*)     All other components within normal limits  CBC WITH DIFFERENTIAL  POCT I-STAT TROPONIN I   Dg Hip Complete Left  09/20/2012  *RADIOLOGY REPORT*  Clinical Data: Left hip pain  LEFT HIP - COMPLETE 2+ VIEW  Comparison: None.  Findings: No acute displaced transcervical fracture through the mid left femoral neck.  The distal fracture fragment is displaced superiorly with respect to the femoral head by one half shaft width.  The remainder of the bony pelvis appears intact.  The femoral head is located on cross-table lateral view.  Unremarkable bowel gas pattern.  IMPRESSION:  Acute transcervical fracture of the left femoral neck.  The femur is displaced superiorly by one half shaft width with respect to the femoral head.   Original Report Authenticated By: Malachy Moan, M.D.    Ct Head Wo Contrast  09/20/2012  *RADIOLOGY REPORT*  Clinical Data:  Syncopal episode resulting in a fall, hitting her head with subsequent headache.  CT HEAD WITHOUT CONTRAST CT MAXILLOFACIAL WITHOUT CONTRAST CT CERVICAL SPINE WITHOUT CONTRAST  Technique:  Multidetector CT imaging of the head, cervical spine, and maxillofacial structures were performed using the standard protocol without intravenous contrast. Multiplanar CT image reconstructions of the cervical spine and maxillofacial structures were also generated.  Comparison:  Previous examinations.  CT HEAD  Findings: No significant change in an old left middle cerebral artery distribution infarct with associated midline shift to the right.  An old right caudate body lacunar infarct is again demonstrated.  There is a left facial laceration and associated soft tissue blood and air.  No skull fracture, intracranial hemorrhage or  paranasal sinus air-fluid levels are seen.  There is  minimal left maxillary sinus mucosal thickening.  IMPRESSION:  1.  No skull fracture or intracranial hemorrhage. 2.  Stable atrophy and old infarcts. 3.  Left facial laceration.  CT MAXILLOFACIAL  Findings:  Left facial soft tissue laceration with associated blood and air in the subcutaneous fat.  No fractures or paranasal sinus air-fluid levels.  IMPRESSION: Left facial soft tissue laceration without fracture.  CT CERVICAL SPINE  Findings:   Multilevel degenerative changes are stable.  No prevertebral soft tissue swelling, fractures or subluxations. Stable right apical pleural thickening and surgical staples.  IMPRESSION:  1.  No fracture or subluxation. 2.  Stable degenerative changes.   Original Report Authenticated By: Beckie Salts, M.D.    Ct Cervical Spine Wo Contrast  09/20/2012  *RADIOLOGY REPORT*  Clinical Data:  Syncopal episode resulting in a fall, hitting her head with subsequent headache.  CT HEAD WITHOUT CONTRAST CT MAXILLOFACIAL WITHOUT CONTRAST CT CERVICAL SPINE WITHOUT CONTRAST  Technique:  Multidetector CT imaging of the head, cervical spine, and maxillofacial structures were performed using the standard protocol without intravenous contrast. Multiplanar CT image reconstructions of the cervical spine and maxillofacial structures were also generated.  Comparison:  Previous examinations.  CT HEAD  Findings: No significant change in an old left middle cerebral artery distribution infarct with associated midline shift to the right.  An old right caudate body lacunar infarct is again demonstrated.  There is a left facial laceration and associated soft tissue blood and air.  No skull fracture, intracranial hemorrhage or paranasal sinus air-fluid levels are seen.  There is minimal left maxillary sinus mucosal thickening.  IMPRESSION:  1.  No skull fracture or intracranial hemorrhage. 2.  Stable atrophy and old infarcts. 3.  Left facial laceration.  CT  MAXILLOFACIAL  Findings:  Left facial soft tissue laceration with associated blood and air in the subcutaneous fat.  No fractures or paranasal sinus air-fluid levels.  IMPRESSION: Left facial soft tissue laceration without fracture.  CT CERVICAL SPINE  Findings:   Multilevel degenerative changes are stable.  No prevertebral soft tissue swelling, fractures or subluxations. Stable right apical pleural thickening and surgical staples.  IMPRESSION:  1.  No fracture or subluxation. 2.  Stable degenerative changes.   Original Report Authenticated By: Beckie Salts, M.D.    Dg Chest Port 1 View  09/20/2012  *RADIOLOGY REPORT*  Clinical Data: Syncope resulting in a fall.  PORTABLE CHEST - 1 VIEW  Comparison: 11/15/2011.  Findings: Stable enlarged cardiac silhouette and hyperexpanded lungs.  The lungs remain clear.  Right apical surgical staples and pleural thickening are unchanged.  Mild left apical pleural thickening is again demonstrated.  Mild scoliosis.  IMPRESSION: No acute abnormality.  Stable cardiomegaly and changes of COPD.   Original Report Authenticated By: Beckie Salts, M.D.    Ct Maxillofacial Wo Cm  09/20/2012  *RADIOLOGY REPORT*  Clinical Data:  Syncopal episode resulting in a fall, hitting her head with subsequent headache.  CT HEAD WITHOUT CONTRAST CT MAXILLOFACIAL WITHOUT CONTRAST CT CERVICAL SPINE WITHOUT CONTRAST  Technique:  Multidetector CT imaging of the head, cervical spine, and maxillofacial structures were performed using the standard protocol without intravenous contrast. Multiplanar CT image reconstructions of the cervical spine and maxillofacial structures were also generated.  Comparison:  Previous examinations.  CT HEAD  Findings: No significant change in an old left middle cerebral artery distribution infarct with associated midline shift to the right.  An old right caudate body lacunar infarct is again demonstrated.  There is a left  facial laceration and associated soft tissue blood and  air.  No skull fracture, intracranial hemorrhage or paranasal sinus air-fluid levels are seen.  There is minimal left maxillary sinus mucosal thickening.  IMPRESSION:  1.  No skull fracture or intracranial hemorrhage. 2.  Stable atrophy and old infarcts. 3.  Left facial laceration.  CT MAXILLOFACIAL  Findings:  Left facial soft tissue laceration with associated blood and air in the subcutaneous fat.  No fractures or paranasal sinus air-fluid levels.  IMPRESSION: Left facial soft tissue laceration without fracture.  CT CERVICAL SPINE  Findings:   Multilevel degenerative changes are stable.  No prevertebral soft tissue swelling, fractures or subluxations. Stable right apical pleural thickening and surgical staples.  IMPRESSION:  1.  No fracture or subluxation. 2.  Stable degenerative changes.   Original Report Authenticated By: Beckie Salts, M.D.      1. Syncope   2. Hip fracture   3. Atrial fibrillation       MDM  I spoke with her orthopedic doctor who will come see her.  Patient admitted to the hospitalist service.        Nelia Shi, MD 09/20/12 1430  Nelia Shi, MD 09/20/12 1534

## 2012-09-20 NOTE — ED Notes (Signed)
Pt reports she is comfortable as long as she does not move her left leg

## 2012-09-20 NOTE — H&P (Signed)
[facility]                                                                                    Patient Demographics  [name], is a 79 y.o. female  CSN: [id number]  MRN: [id number]  DOB - 24-Jan-1931  Admit Date - 09/20/2012  Outpatient Primary MD for the patient is [name], MD   With History of -  Past Medical History  Diagnosis Date  . Cerebral infarction   . Dysphagia   . Atrial fibrillation   . Hyperlipidemia   . HTN (hypertension)   . Hypothyroidism   . Stroke   . Pneumonia     years ago      Past Surgical History  Procedure Date  . Back surgery   . Abdominal hysterectomy   . Pleural scarification     in for   Chief Complaint  Patient presents with  . Fall     HPI  [name]  is a 79 y.o. female, with history of atrial fibrillation and CVA in September of last year, who is on Coumadin, history of seizures, was in usual state of health however this morning while admitting in her house she had a sensation of warmth and flushing following which she passed out and fell hitting her left hip, of and left forehead to the floor, she sustained a laceration around her left forehead, started experiencing pain around her left elbow and left hip, she then presented to the ER where she was diagnosed with left femoral neck fracture, laceration around the left for head along with a large bruise, she was also found to be in atrial fibrillation with RVR and I was called to admit the patient.   Patient currently denies any headache, denies any chest pain or palpitations that she can feel, besides joint pains as discussed above no other subjective complaints. She has good exercise tolerance at baseline and can walk a few blocks without any chest discomfort or shortness of breath. Denies any preceding diarrhea, dysuria or other systemic complaints.    Review of Systems    In addition to the HPI above,  No Fever-chills, No Headache, No changes  with Vision or hearing, No problems swallowing food or Liquids, No Chest pain, Cough or Shortness of Breath, No Abdominal pain, No Nausea or Vommitting, Bowel movements are regular, No Blood in stool or Urine, No dysuria, No new skin rashes or bruises, No new joints pains-aches, except left-sided orbital pain, left arm and left hip pain No new weakness, tingling, numbness in any extremity, No recent weight gain or loss, No polyuria, polydypsia or polyphagia, No significant Mental Stressors.  A full 10 point Review of Systems was done, except as stated above, all other Review of Systems were negative.   Social History History  Substance Use Topics  . Smoking status: Former Smoker    Quit date: 11/16/1979  . Smokeless tobacco: Never Used  . Alcohol Use: Yes     Comment: social    Family History No history of sudden cardiac death  Prior to Admission medications   Medication Sig Start Date End Date Taking? Authorizing Provider  atorvastatin (LIPITOR) 10 MG tablet Take 1 tablet (10 mg total) by mouth daily at 6 PM. 11/17/11 11/16/12 Yes Nishant Dhungel, MD  Calcium Carbonate (CALTRATE 600 PO) Take 1 tablet by mouth daily at 12 noon.    Yes Historical Provider, MD  diltiazem (CARTIA XT) 240 MG 24 hr capsule Take 240 mg by mouth daily.   Yes Historical Provider, MD  Docusate Calcium (STOOL SOFTENER PO) Take 1 capsule by mouth at bedtime.    Yes Historical Provider, MD  levETIRAcetam (KEPPRA) 500 MG tablet Take 1 tablet (500 mg total) by mouth 2 (two) times daily. 11/17/11 11/16/12 Yes Nishant Dhungel, MD  levothyroxine (SYNTHROID, LEVOTHROID) 50 MCG tablet Take 50 mcg by mouth every morning.    Yes Historical Provider, MD  mirtazapine (REMERON) 15 MG tablet Take 15 mg by mouth at bedtime.   Yes Historical Provider, MD  Multiple Vitamin (MULITIVITAMIN WITH MINERALS) TABS Take 1 tablet by mouth daily at 12 noon.   Yes Historical Provider, MD  polycarbophil (FIBERCON) 625 MG tablet Take 625  mg by mouth daily at 12 noon.   Yes Historical Provider, MD  polyethylene glycol powder (GLYCOLAX/MIRALAX) powder Take 17 g by mouth every morning.   Yes Historical Provider, MD  warfarin (COUMADIN) 5 MG tablet Take 1.5 tablets (7.5 mg total) by mouth every evening. Takes daily at 1700 11/17/11  Yes Nishant Dhungel, MD    No Known Allergies  Physical Exam  Vitals  Blood pressure 124/67, pulse 116, temperature 98 F (36.7 C), temperature source Oral, resp. rate 18, SpO2 97.00%.   1. General frail elderly white female lying in bed in NAD,  has a large bruise/Moore laceration around her left eye and left forehead.  2. Normal affect and insight, Not Suicidal or Homicidal, Awake Alert, Oriented X 3.  3. No F.N deficits, ALL C.Nerves Intact, Strength 5/5 all 4 extremities, Sensation intact all 4 extremities, Plantars down going.  4. Ears and Eyes appear Normal, Conjunctivae clear, PERRLA. Moist Oral Mucosa.  5. Supple Neck, No JVD, No cervical lymphadenopathy appriciated, No Carotid Bruits.  6. Symmetrical Chest wall movement, Good air movement bilaterally, CTAB.  7. i RRR, No Gallops, Rubs or Murmurs, No Parasternal Heave.  8. Positive Bowel Sounds, Abdomen Soft, Non tender, No organomegaly appriciated,No rebound -guarding or rigidity.  9.  No Cyanosis, Normal Skin Turgor, No Skin Rash or Bruise.  10. Good muscle tone,  joints appear normal , no effusions, Normal ROM. Left leg is internally rotated, she is to the left arm flexed  11. No Palpable Lymph Nodes in Neck or Axillae     Data Review  CBC  Lab 09/20/12 1403 09/20/12 0943  WBC -- 8.3  HGB 13.6 13.1  HCT 40.0 40.6  PLT -- 164  MCV -- 91.6  MCH -- 29.6  MCHC -- 32.3  RDW -- 13.5  LYMPHSABS -- 2.0  MONOABS -- 0.6  EOSABS -- 0.3  BASOSABS -- 0.0  BANDABS -- --   ------------------------------------------------------------------------------------------------------------------  Chemistries   Lab 09/20/12 1403    NA 142  K 3.3*  CL 106  CO2 --  GLUCOSE 104*  BUN 10  CREATININE 0.70  CALCIUM --  MG --  AST --  ALT --  ALKPHOS --  BILITOT --   ------------------------------------------------------------------------------------------------------------------ CrCl is unknown because both a height and weight (above a minimum accepted value) are required for this calculation. ------------------------------------------------------------------------------------------------------------------ No results found for this basename: TSH,T4TOTAL,FREET3,T3FREE,THYROIDAB in the last 72 hours   Coagulation  profile  Lab 09/20/12 0943  INR 2.24*  PROTIME --   ------------------------------------------------------------------------------------------------------------------- No results found for this basename: DDIMER:2 in the last 72 hours -------------------------------------------------------------------------------------------------------------------  Cardiac Enzymes No results found for this basename: CK:3,CKMB:3,TROPONINI:3,MYOGLOBIN:3 in the last 168 hours ------------------------------------------------------------------------------------------------------------------ No components found with this basename: POCBNP:3   ---------------------------------------------------------------------------------------------------------------  Urinalysis    Component Value Date/Time   COLORURINE YELLOW 09/20/2012 0946   APPEARANCEUR CLOUDY* 09/20/2012 0946   LABSPEC 1.015 09/20/2012 0946   PHURINE 7.5 09/20/2012 0946   GLUCOSEU NEGATIVE 09/20/2012 0946   HGBUR NEGATIVE 09/20/2012 0946   BILIRUBINUR NEGATIVE 09/20/2012 0946   KETONESUR NEGATIVE 09/20/2012 0946   PROTEINUR NEGATIVE 09/20/2012 0946   UROBILINOGEN 0.2 09/20/2012 0946   NITRITE NEGATIVE 09/20/2012 0946   LEUKOCYTESUR NEGATIVE 09/20/2012 0946     ----------------------------------------------------------------------------------------------------------------  Imaging results:   Dg Hip Complete Left  09/20/2012  *RADIOLOGY REPORT*  Clinical Data: Left hip pain  LEFT HIP - COMPLETE 2+ VIEW  Comparison: None.  Findings: No acute displaced transcervical fracture through the mid left femoral neck.  The distal fracture fragment is displaced superiorly with respect to the femoral head by one half shaft width.  The remainder of the bony pelvis appears intact.  The femoral head is located on cross-table lateral view.  Unremarkable bowel gas pattern.  IMPRESSION:  Acute transcervical fracture of the left femoral neck.  The femur is displaced superiorly by one half shaft width with respect to the femoral head.   Original Report Authenticated By: Malachy Moan, M.D.    Ct Head Wo Contrast  09/20/2012  *RADIOLOGY REPORT*  Clinical Data:  Syncopal episode resulting in a fall, hitting her head with subsequent headache.  CT HEAD WITHOUT CONTRAST CT MAXILLOFACIAL WITHOUT CONTRAST CT CERVICAL SPINE WITHOUT CONTRAST  Technique:  Multidetector CT imaging of the head, cervical spine, and maxillofacial structures were performed using the standard protocol without intravenous contrast. Multiplanar CT image reconstructions of the cervical spine and maxillofacial structures were also generated.  Comparison:  Previous examinations.  CT HEAD  Findings: No significant change in an old left middle cerebral artery distribution infarct with associated midline shift to the right.  An old right caudate body lacunar infarct is again demonstrated.  There is a left facial laceration and associated soft tissue blood and air.  No skull fracture, intracranial hemorrhage or paranasal sinus air-fluid levels are seen.  There is minimal left maxillary sinus mucosal thickening.  IMPRESSION:  1.  No skull fracture or intracranial hemorrhage. 2.  Stable atrophy and old infarcts. 3.  Left  facial laceration.  CT MAXILLOFACIAL  Findings:  Left facial soft tissue laceration with associated blood and air in the subcutaneous fat.  No fractures or paranasal sinus air-fluid levels.  IMPRESSION: Left facial soft tissue laceration without fracture.  CT CERVICAL SPINE  Findings:   Multilevel degenerative changes are stable.  No prevertebral soft tissue swelling, fractures or subluxations. Stable right apical pleural thickening and surgical staples.  IMPRESSION:  1.  No fracture or subluxation. 2.  Stable degenerative changes.   Original Report Authenticated By: Beckie Salts, M.D.    Ct Cervical Spine Wo Contrast  09/20/2012  *RADIOLOGY REPORT*  Clinical Data:  Syncopal episode resulting in a fall, hitting her head with subsequent headache.  CT HEAD WITHOUT CONTRAST CT MAXILLOFACIAL WITHOUT CONTRAST CT CERVICAL SPINE WITHOUT CONTRAST  Technique:  Multidetector CT imaging of the head, cervical spine, and maxillofacial structures were performed using the standard protocol without intravenous contrast. Multiplanar CT image reconstructions  of the cervical spine and maxillofacial structures were also generated.  Comparison:  Previous examinations.  CT HEAD  Findings: No significant change in an old left middle cerebral artery distribution infarct with associated midline shift to the right.  An old right caudate body lacunar infarct is again demonstrated.  There is a left facial laceration and associated soft tissue blood and air.  No skull fracture, intracranial hemorrhage or paranasal sinus air-fluid levels are seen.  There is minimal left maxillary sinus mucosal thickening.  IMPRESSION:  1.  No skull fracture or intracranial hemorrhage. 2.  Stable atrophy and old infarcts. 3.  Left facial laceration.  CT MAXILLOFACIAL  Findings:  Left facial soft tissue laceration with associated blood and air in the subcutaneous fat.  No fractures or paranasal sinus air-fluid levels.  IMPRESSION: Left facial soft tissue  laceration without fracture.  CT CERVICAL SPINE  Findings:   Multilevel degenerative changes are stable.  No prevertebral soft tissue swelling, fractures or subluxations. Stable right apical pleural thickening and surgical staples.  IMPRESSION:  1.  No fracture or subluxation. 2.  Stable degenerative changes.   Original Report Authenticated By: Beckie Salts, M.D.    Dg Chest Port 1 View  09/20/2012  *RADIOLOGY REPORT*  Clinical Data: Syncope resulting in a fall.  PORTABLE CHEST - 1 VIEW  Comparison: 11/15/2011.  Findings: Stable enlarged cardiac silhouette and hyperexpanded lungs.  The lungs remain clear.  Right apical surgical staples and pleural thickening are unchanged.  Mild left apical pleural thickening is again demonstrated.  Mild scoliosis.  IMPRESSION: No acute abnormality.  Stable cardiomegaly and changes of COPD.   Original Report Authenticated By: Beckie Salts, M.D.    Ct Maxillofacial Wo Cm  09/20/2012  *RADIOLOGY REPORT*  Clinical Data:  Syncopal episode resulting in a fall, hitting her head with subsequent headache.  CT HEAD WITHOUT CONTRAST CT MAXILLOFACIAL WITHOUT CONTRAST CT CERVICAL SPINE WITHOUT CONTRAST  Technique:  Multidetector CT imaging of the head, cervical spine, and maxillofacial structures were performed using the standard protocol without intravenous contrast. Multiplanar CT image reconstructions of the cervical spine and maxillofacial structures were also generated.  Comparison:  Previous examinations.  CT HEAD  Findings: No significant change in an old left middle cerebral artery distribution infarct with associated midline shift to the right.  An old right caudate body lacunar infarct is again demonstrated.  There is a left facial laceration and associated soft tissue blood and air.  No skull fracture, intracranial hemorrhage or paranasal sinus air-fluid levels are seen.  There is minimal left maxillary sinus mucosal thickening.  IMPRESSION:  1.  No skull fracture or intracranial  hemorrhage. 2.  Stable atrophy and old infarcts. 3.  Left facial laceration.  CT MAXILLOFACIAL  Findings:  Left facial soft tissue laceration with associated blood and air in the subcutaneous fat.  No fractures or paranasal sinus air-fluid levels.  IMPRESSION: Left facial soft tissue laceration without fracture.  CT CERVICAL SPINE  Findings:   Multilevel degenerative changes are stable.  No prevertebral soft tissue swelling, fractures or subluxations. Stable right apical pleural thickening and surgical staples.  IMPRESSION:  1.  No fracture or subluxation. 2.  Stable degenerative changes.   Original Report Authenticated By: Beckie Salts, M.D.     My personal review of EKG: Rhythm Afib, Rate 120s /min,   no Acute ST changes    Assessment & Plan   1. Syncope and fall in a patient with recent right MCA territory stroke , atrial fibrillation  on Coumadin and history of seizures.- Her syncopal episode sounds vasovagal, however she has history of CVA in the past along with seizures, will monitor on telemetry, EKG shows atrial fibrillation but no acute changes, CT of brain is stable, will monitor on telemetry, along with checking Keppra levels, recent echo reviewed with no major valvular abnormalities and stable EF, have discussed her case with her cardiologist Dr. Irving Copas who will see the patient in consultation. Most likely will benefit from being discharged on a Holter monitor.     2. Left femoral neck fracture due to fall as #1 above, left elbow and arm pain.- Case has been discussed by ER physician with Dr. Otelia Sergeant, orthopedic surgeon who will see the patient shortly, I will x-ray her left shoulder elbow and left wrist also, patient most likely will go for surgery tomorrow, her INR is 2.24, she will get vitamin K now, if need be FPs can be given for reversal, postop heparin drip can be initiated in the light of recent CVA, I have discussed this with patient's cardiologist Dr. Genevieve Norlander along with neurologist  Dr. Thana Farr, explained to patient and husband that there is a small elevated risk of a new CVA once Coumadin is reversed both accept this risk. From a cardiac standpoint Dr. Genevieve Norlander will see the patient, patient in my opinion is low to moderate risk for adverse cardiopulmonary outcome this has been discussed with patient and husband but except the risk and want to proceed with surgery.   Cardio-Pulm Risk stratification for surgery and recommendations to minimize the same:-  A.Cardio-Pulmonary Risk -  this patient is a moderate for adverse Cardio-Pulmonary  Outcome  from surgery, the risks and benefits were discussed and acceptable to agent and her husband  Recommendations for optimizing Cardio-Pulmonary  Risk risk factors  1. Keep SBP<140, HR<85, use IV Cardizem drip ordered 2. Tight I&O goal to keep even. 3. Minimal sedation. 4. Good pulmunary toilet. 5. Scheduled/PRN Nebs ordered keep Pox>90% 6. Hb>8, transfuse as needed- Lasix 10mg  IV after each unit PRBC Transfused.   Will request Surgeon to please Order Lovenox/DVT prophylaxis of his/her choice when OK from Surgeon's standpoint post op.     3. History of seizures, we'll continue home dose Keppra, will check Keppra levels.      4. History of hypothyroidism. Home dose Synthroid to continue check TSH.     5. Left for head laceration and bruise. Laceration to be repaired by ER physician Dr. Radford Pax, vitamin K for now, old compress to the site and monitor, CT of the site stable.     DVT Prophylaxis  SCDs    AM Labs Ordered, also please review Full Orders  Family Communication: Admission, patients condition and plan of care including tests being ordered have been discussed with the patient and husband who indicate understanding and agree with the plan and Code Status.  Code Status Full  Disposition Plan: TBD  Time spent in minutes : 35  Condition GUARDED   Leroy Sea M.D on 09/20/2012 at 3:02  PM  Between 7am to 7pm - Pager - [phone]  After 7pm go to [email or web address] - password [email or web address]  And look for the night coverage person covering me after hours  [facility] Office  [phone]

## 2012-09-20 NOTE — ED Provider Notes (Signed)
Suturing of laceration to lateral aspect face adjacent to L eye, per request from Dr. Belva Chimes REPAIR Performed by: Fayrene Helper Authorized by: Fayrene Helper Consent: Verbal consent obtained. Risks and benefits: risks, benefits and alternatives were discussed Consent given by: patient Patient identity confirmed: provided demographic data Prepped and Draped in normal sterile fashion Wound explored  Laceration Location: L face, lateral to L eye  Laceration Length: 2cm  No Foreign Bodies seen or palpated  Anesthesia: local infiltration  Local anesthetic: lidocaine 2% w epinephrine  Anesthetic total: 2 ml  Irrigation method: syringe Amount of cleaning: standard  Skin closure: prolene 6.0  Number of sutures: 6  Technique: simple interrupted  Patient tolerance: Patient tolerated the procedure well with no immediate complications.   Fayrene Helper, PA-C 09/20/12 1558

## 2012-09-20 NOTE — ED Notes (Signed)
Pt brought in from [facility], [facility] after syncopal episode. Witness on scene reports pt hit head. Pt c/o left hip pain, lt elbow skin tear, bruising swelling to left brow/head. 12 lead by EMS A-Fib, pt on coumadin therapy.

## 2012-09-21 ENCOUNTER — Inpatient Hospital Stay (HOSPITAL_COMMUNITY): Payer: Medicare Other | Admitting: Certified Registered Nurse Anesthetist

## 2012-09-21 ENCOUNTER — Encounter (HOSPITAL_COMMUNITY)
Admission: EM | Disposition: A | Discharge status: Skilled Nursing Facility | Payer: Self-pay | Source: Home / Self Care | Attending: Internal Medicine

## 2012-09-21 ENCOUNTER — Encounter (HOSPITAL_COMMUNITY): Payer: Self-pay | Admitting: Certified Registered Nurse Anesthetist

## 2012-09-21 ENCOUNTER — Encounter (HOSPITAL_COMMUNITY): Payer: Self-pay | Admitting: Specialist

## 2012-09-21 ENCOUNTER — Inpatient Hospital Stay (HOSPITAL_COMMUNITY): Payer: Medicare Other

## 2012-09-21 DIAGNOSIS — E039 Hypothyroidism, unspecified: Secondary | ICD-10-CM

## 2012-09-21 DIAGNOSIS — E46 Unspecified protein-calorie malnutrition: Secondary | ICD-10-CM

## 2012-09-21 DIAGNOSIS — I10 Essential (primary) hypertension: Secondary | ICD-10-CM

## 2012-09-21 DIAGNOSIS — R569 Unspecified convulsions: Secondary | ICD-10-CM

## 2012-09-21 HISTORY — PX: HIP ARTHROPLASTY: SHX981

## 2012-09-21 LAB — BASIC METABOLIC PANEL
BUN: 12 mg/dL (ref 6–23)
CO2: 22 mEq/L (ref 19–32)
Chloride: 106 mEq/L (ref 96–112)
Creatinine, Ser: 0.61 mg/dL (ref 0.50–1.10)

## 2012-09-21 LAB — CBC
HCT: 38.1 % (ref 36.0–46.0)
MCHC: 33.6 g/dL (ref 30.0–36.0)
MCV: 90.3 fL (ref 78.0–100.0)
RDW: 13.6 % (ref 11.5–15.5)

## 2012-09-21 LAB — TROPONIN I: Troponin I: <0.3 ng/mL (ref <0.30)

## 2012-09-21 SURGERY — HEMIARTHROPLASTY, HIP, DIRECT ANTERIOR APPROACH, FOR FRACTURE
Anesthesia: General | Site: Hip | Laterality: Left | Wound class: Clean

## 2012-09-21 MED ORDER — PHENOL 1.4 % MT LIQD: 1.0000 | OROMUCOSAL | Status: DC | PRN

## 2012-09-21 MED ORDER — ROCURONIUM BROMIDE 100 MG/10ML IV SOLN
INTRAVENOUS | Status: DC | PRN
  Administered 2012-09-21: 50 mg via INTRAVENOUS

## 2012-09-21 MED ORDER — PROPOFOL 10 MG/ML IV BOLUS
INTRAVENOUS | Status: DC | PRN
  Administered 2012-09-21: 100000 ug via INTRAVENOUS

## 2012-09-21 MED ORDER — PHENYLEPHRINE HCL 10 MG/ML IJ SOLN
10.0000 mg | INTRAVENOUS | Status: DC | PRN
  Administered 2012-09-21: 50 ug/min via INTRAVENOUS

## 2012-09-21 MED ORDER — MEPERIDINE HCL 25 MG/ML IJ SOLN: 6.2500 mg | INTRAMUSCULAR | Status: DC | PRN

## 2012-09-21 MED ORDER — CEFAZOLIN SODIUM-DEXTROSE 2-3 GM-% IV SOLR
2.0000 g | Freq: Four times a day (QID) | INTRAVENOUS | Status: AC
  Administered 2012-09-21 – 2012-09-22 (×2): 2 g via INTRAVENOUS
  Filled 2012-09-21 (×4): qty 50

## 2012-09-21 MED ORDER — LIDOCAINE HCL (CARDIAC) 20 MG/ML IV SOLN
INTRAVENOUS | Status: DC | PRN
  Administered 2012-09-21: 100 mg via INTRAVENOUS

## 2012-09-21 MED ORDER — METOCLOPRAMIDE HCL 5 MG/ML IJ SOLN
5.0000 mg | Freq: Three times a day (TID) | INTRAMUSCULAR | Status: DC | PRN
  Filled 2012-09-21: qty 2

## 2012-09-21 MED ORDER — HEPARIN (PORCINE) IN NACL 100-0.45 UNIT/ML-% IJ SOLN
700.0000 [IU]/h | INTRAMUSCULAR | Status: DC
  Administered 2012-09-21: 650 [IU]/h via INTRAVENOUS
  Administered 2012-09-23: 700 [IU]/h via INTRAVENOUS
  Filled 2012-09-21 (×2): qty 250

## 2012-09-21 MED ORDER — ONDANSETRON HCL 4 MG/2ML IJ SOLN: 4.0000 mg | Freq: Four times a day (QID) | INTRAMUSCULAR | Status: DC | PRN

## 2012-09-21 MED ORDER — ACETAMINOPHEN 325 MG PO TABS
650.0000 mg | ORAL_TABLET | Freq: Four times a day (QID) | ORAL | Status: DC | PRN
  Administered 2012-09-23 – 2012-09-29 (×2): 650 mg via ORAL
  Filled 2012-09-21 (×2): qty 2

## 2012-09-21 MED ORDER — MENTHOL 3 MG MT LOZG: 1.0000 | LOZENGE | OROMUCOSAL | Status: DC | PRN

## 2012-09-21 MED ORDER — SODIUM CHLORIDE 0.9 % IR SOLN
Status: DC | PRN
  Administered 2012-09-21: 1000 mL

## 2012-09-21 MED ORDER — OXYCODONE HCL 5 MG/5ML PO SOLN: 5.0000 mg | Freq: Once | ORAL | Status: DC | PRN

## 2012-09-21 MED ORDER — HYDROMORPHONE HCL PF 1 MG/ML IJ SOLN: 0.2500 mg | INTRAMUSCULAR | Status: DC | PRN

## 2012-09-21 MED ORDER — ENSURE COMPLETE PO LIQD
237.0000 mL | Freq: Two times a day (BID) | ORAL | Status: DC
  Administered 2012-09-22 – 2012-10-01 (×14): 237 mL via ORAL

## 2012-09-21 MED ORDER — ACETAMINOPHEN 650 MG RE SUPP: 650.0000 mg | Freq: Four times a day (QID) | RECTAL | Status: DC | PRN

## 2012-09-21 MED ORDER — HYDROCODONE-ACETAMINOPHEN 5-325 MG PO TABS
1.0000 | ORAL_TABLET | Freq: Four times a day (QID) | ORAL | Status: DC | PRN
  Administered 2012-09-24 – 2012-09-25 (×2): 2 via ORAL
  Administered 2012-09-27 – 2012-09-28 (×2): 1 via ORAL
  Filled 2012-09-21: qty 1
  Filled 2012-09-21: qty 2
  Filled 2012-09-21: qty 1
  Filled 2012-09-21 (×2): qty 2

## 2012-09-21 MED ORDER — PHENYLEPHRINE HCL 10 MG/ML IJ SOLN
INTRAMUSCULAR | Status: DC | PRN
  Administered 2012-09-21: 75 ug via INTRAVENOUS
  Administered 2012-09-21: 100 ug via INTRAVENOUS

## 2012-09-21 MED ORDER — METOCLOPRAMIDE HCL 5 MG PO TABS
5.0000 mg | ORAL_TABLET | Freq: Three times a day (TID) | ORAL | Status: DC | PRN
  Filled 2012-09-21: qty 2

## 2012-09-21 MED ORDER — LACTATED RINGERS IV SOLN
INTRAVENOUS | Status: DC
  Administered 2012-09-21: 14:00:00 via INTRAVENOUS

## 2012-09-21 MED ORDER — LACTATED RINGERS IV SOLN
INTRAVENOUS | Status: DC | PRN
  Administered 2012-09-21 (×3): via INTRAVENOUS

## 2012-09-21 MED ORDER — WARFARIN - PHARMACIST DOSING INPATIENT: Freq: Every day | Status: DC

## 2012-09-21 MED ORDER — OXYCODONE HCL 5 MG PO TABS: 5.0000 mg | ORAL_TABLET | Freq: Once | ORAL | Status: DC | PRN

## 2012-09-21 MED ORDER — ONDANSETRON HCL 4 MG/2ML IJ SOLN: 4.0000 mg | Freq: Once | INTRAMUSCULAR | Status: DC | PRN

## 2012-09-21 MED ORDER — BUPIVACAINE-EPINEPHRINE (PF) 0.5% -1:200000 IJ SOLN
INTRAMUSCULAR | Status: AC
  Filled 2012-09-21: qty 10

## 2012-09-21 MED ORDER — MORPHINE SULFATE 2 MG/ML IJ SOLN: 0.5000 mg | INTRAMUSCULAR | Status: DC | PRN

## 2012-09-21 MED ORDER — ONDANSETRON HCL 4 MG PO TABS: 4.0000 mg | ORAL_TABLET | Freq: Four times a day (QID) | ORAL | Status: DC | PRN

## 2012-09-21 MED ORDER — WARFARIN SODIUM 7.5 MG PO TABS
7.5000 mg | ORAL_TABLET | Freq: Once | ORAL | Status: AC
  Administered 2012-09-21: 7.5 mg via ORAL
  Filled 2012-09-21: qty 1

## 2012-09-21 MED ORDER — FENTANYL CITRATE 0.05 MG/ML IJ SOLN
INTRAMUSCULAR | Status: DC | PRN
  Administered 2012-09-21: 75 ug via INTRAVENOUS
  Administered 2012-09-21: 100 ug via INTRAVENOUS
  Administered 2012-09-21: 75 ug via INTRAVENOUS

## 2012-09-21 SURGICAL SUPPLY — 75 items
BENZOIN TINCTURE PRP APPL 2/3 (GAUZE/BANDAGES/DRESSINGS) IMPLANT
BLADE SAW SAG 73X25 THK (BLADE) ×1
BLADE SAW SGTL 73X25 THK (BLADE) ×2 IMPLANT
BRUSH FEMORAL CANAL (MISCELLANEOUS) IMPLANT
CLOTH BEACON ORANGE TIMEOUT ST (SAFETY) ×3 IMPLANT
COVER BACK TABLE 24X17X13 BIG (DRAPES) IMPLANT
COVER SURGICAL LIGHT HANDLE (MISCELLANEOUS) ×3 IMPLANT
DERMABOND ADVANCED (GAUZE/BANDAGES/DRESSINGS) ×1
DERMABOND ADVANCED .7 DNX12 (GAUZE/BANDAGES/DRESSINGS) ×2 IMPLANT
DRAPE INCISE IOBAN 66X45 STRL (DRAPES) ×3 IMPLANT
DRAPE ORTHO SPLIT 77X108 STRL (DRAPES) ×2
DRAPE SURG ORHT 6 SPLT 77X108 (DRAPES) ×4 IMPLANT
DRAPE U-SHAPE 47X51 STRL (DRAPES) ×3 IMPLANT
DRILL BIT 7/64X5 (BIT) ×3 IMPLANT
DRSG MEPILEX BORDER 4X4 (GAUZE/BANDAGES/DRESSINGS) ×3 IMPLANT
DRSG MEPILEX BORDER 4X8 (GAUZE/BANDAGES/DRESSINGS) ×3 IMPLANT
DURAPREP 26ML APPLICATOR (WOUND CARE) ×3 IMPLANT
ELECT BLADE 6.5 EXT (BLADE) IMPLANT
ELECT REM PT RETURN 9FT ADLT (ELECTROSURGICAL) ×3
ELECTRODE REM PT RTRN 9FT ADLT (ELECTROSURGICAL) ×2 IMPLANT
ERX1223 IMPLANT
EVACUATOR 1/8 PVC DRAIN (DRAIN) ×3 IMPLANT
FACESHIELD LNG OPTICON STERILE (SAFETY) ×6 IMPLANT
FILTER STRAW FLUID ASPIR (MISCELLANEOUS) IMPLANT
GLOVE BIO SURGEON STRL SZ7.5 (GLOVE) ×3 IMPLANT
GLOVE BIO SURGEON STRL SZ8.5 (GLOVE) ×3 IMPLANT
GLOVE BIOGEL PI IND STRL 7.0 (GLOVE) ×2 IMPLANT
GLOVE BIOGEL PI IND STRL 7.5 (GLOVE) ×2 IMPLANT
GLOVE BIOGEL PI IND STRL 8 (GLOVE) ×2 IMPLANT
GLOVE BIOGEL PI INDICATOR 7.0 (GLOVE) ×1
GLOVE BIOGEL PI INDICATOR 7.5 (GLOVE) ×1
GLOVE BIOGEL PI INDICATOR 8 (GLOVE) ×1
GLOVE ECLIPSE 7.0 STRL STRAW (GLOVE) ×3 IMPLANT
GLOVE ECLIPSE 7.5 STRL STRAW (GLOVE) ×3 IMPLANT
GLOVE ECLIPSE 8.5 STRL (GLOVE) ×3 IMPLANT
GLOVE SURG 8.5 LATEX PF (GLOVE) ×3 IMPLANT
GLOVE SURG SS PI 6.5 STRL IVOR (GLOVE) ×3 IMPLANT
GLOVE SURG SS PI 8.5 STRL IVOR (GLOVE) ×3
GLOVE SURG SS PI 8.5 STRL STRW (GLOVE) ×6 IMPLANT
GOWN BRE IMP SLV AUR XL STRL (GOWN DISPOSABLE) ×3 IMPLANT
GOWN PREVENTION PLUS LG XLONG (DISPOSABLE) IMPLANT
GOWN PREVENTION PLUS XXLARGE (GOWN DISPOSABLE) ×9 IMPLANT
GOWN SRG XL XLNG 56XLVL 4 (GOWN DISPOSABLE) ×4 IMPLANT
GOWN STRL NON-REIN LRG LVL3 (GOWN DISPOSABLE) IMPLANT
GOWN STRL NON-REIN XL XLG LVL4 (GOWN DISPOSABLE) ×2
HANDPIECE INTERPULSE COAX TIP (DISPOSABLE)
IMMOBILIZER KNEE 20 (SOFTGOODS) ×3
IMMOBILIZER KNEE 20 THIGH 36 (SOFTGOODS) ×2 IMPLANT
KIT BASIN OR (CUSTOM PROCEDURE TRAY) ×3 IMPLANT
KIT ROOM TURNOVER OR (KITS) ×3 IMPLANT
MANIFOLD NEPTUNE II (INSTRUMENTS) ×3 IMPLANT
NEEDLE 22X1 1/2 (OR ONLY) (NEEDLE) IMPLANT
NS IRRIG 1000ML POUR BTL (IV SOLUTION) ×3 IMPLANT
PACK TOTAL JOINT (CUSTOM PROCEDURE TRAY) ×3 IMPLANT
PAD ARMBOARD 7.5X6 YLW CONV (MISCELLANEOUS) ×6 IMPLANT
PASSER SUT SWANSON 36MM LOOP (INSTRUMENTS) ×3 IMPLANT
PRESSURIZER FEMORAL UNIV (MISCELLANEOUS) IMPLANT
SET HNDPC FAN SPRY TIP SCT (DISPOSABLE) IMPLANT
SPONGE LAP 4X18 X RAY DECT (DISPOSABLE) IMPLANT
STRIP CLOSURE SKIN 1/2X4 (GAUZE/BANDAGES/DRESSINGS) IMPLANT
SUCTION FRAZIER TIP 10 FR DISP (SUCTIONS) ×3 IMPLANT
SUT ETHIBOND NAB CT1 #1 30IN (SUTURE) ×9 IMPLANT
SUT VIC AB 0 CT1 27 (SUTURE) ×1
SUT VIC AB 0 CT1 27XBRD ANBCTR (SUTURE) ×2 IMPLANT
SUT VIC AB 1 CT1 27 (SUTURE)
SUT VIC AB 1 CT1 27XBRD ANBCTR (SUTURE) IMPLANT
SUT VIC AB 2-0 CT1 27 (SUTURE) ×1
SUT VIC AB 2-0 CT1 TAPERPNT 27 (SUTURE) ×2 IMPLANT
SUT VICRYL 4-0 PS2 18IN ABS (SUTURE) ×3 IMPLANT
SYR CONTROL 10ML LL (SYRINGE) IMPLANT
TOWEL OR 17X24 6PK STRL BLUE (TOWEL DISPOSABLE) ×3 IMPLANT
TOWEL OR 17X26 10 PK STRL BLUE (TOWEL DISPOSABLE) ×3 IMPLANT
TOWER CARTRIDGE SMART MIX (DISPOSABLE) IMPLANT
TRAY FOLEY CATH 14FR (SET/KITS/TRAYS/PACK) IMPLANT
WATER STERILE IRR 1000ML POUR (IV SOLUTION) ×3 IMPLANT

## 2012-09-21 NOTE — Progress Notes (Signed)
Patient ID: [name]  female  ZOX:[id number]    DOB: 07-12-1931    DOA: 09/20/2012  PCP: [name], MD  Assessment/Plan: Principal Problem:  *Hip fracture - ortho following, NPO for surgery today - cardiology following for clearance  Active Problems:  Atrial fibrillation - currently on cardizem gtt - coumadin on hold for surgery - once stable after surgery and okay by ortho, will need to place her back on coumadin and heparin gtt  Syncope - cardiology folowing, cont tele, out-patient work-up    History of CVA (cerebrovascular accident) - resume warfarin and heparin gtt after surgery  Fall: will start PT/OT after surgery   Hypothyroidism; cont synthroid   HTN (hypertension)  Seizures: cont keppra  DVT Prophylaxis: heparin and coumadin on hold  Code Status:  Disposition: will likely need SNF   Subjective: - awaiting surgery at the time of my encounter  Objective: Weight change:   Intake/Output Summary (Last 24 hours) at 09/21/12 1533 Last data filed at 09/21/12 1504  Gross per 24 hour  Intake   1000 ml  Output   2575 ml  Net  -1575 ml   Blood pressure 134/87, pulse 85, temperature 98.6 F (37 C), temperature source Oral, resp. rate 16, weight 51.9 kg (114 lb 6.7 oz), SpO2 98.00%.  Physical Exam: General: Alert and awake, oriented x3, not in any acute distress. HEENT: marked left periorbital bruising and ecchymosis, suturing on forehead CVS: ireg ireg Chest: clear to auscultation bilaterally, no wheezing, rales or rhonchi Abdomen: soft nontender, nondistended, normal bowel sounds, no organomegaly Extremities: no cyanosis, clubbing or edema noted bilaterally   Lab Results: Basic Metabolic Panel:  Lab 09/21/12 8119 09/20/12 1403  NA 140 142  K 3.7 3.3*  CL 106 106  CO2 22 --  GLUCOSE 88 104*  BUN 12 10  CREATININE 0.61 0.70  CALCIUM 9.1 --  MG -- --  PHOS -- --   Liver Function Tests: No results found for this basename:  AST:2,ALT:2,ALKPHOS:2,BILITOT:2,PROT:2,ALBUMIN:2 in the last 168 hours No results found for this basename: LIPASE:2,AMYLASE:2 in the last 168 hours No results found for this basename: AMMONIA:2 in the last 168 hours CBC:  Lab 09/21/12 0834 09/20/12 1403 09/20/12 0943  WBC 9.3 -- 8.3  NEUTROABS -- -- 5.4  HGB 12.8 13.6 --  HCT 38.1 40.0 --  MCV 90.3 -- 91.6  PLT 121* -- 164   Cardiac Enzymes:  Lab 09/21/12 0834 09/20/12 2322 09/20/12 1811  CKTOTAL -- -- --  CKMB -- -- --  CKMBINDEX -- -- --  TROPONINI <0.30 <0.30 <0.30   BNP: No components found with this basename: POCBNP:2 CBG: No results found for this basename: GLUCAP:5 in the last 168 hours   Micro Results: No results found for this or any previous visit (from the past 240 hour(s)).  Studies/Results: Dg Elbow 2 Views Left  09/20/2012  *RADIOLOGY REPORT*  Clinical Data: Fall  LEFT ELBOW - 2 VIEW  Comparison: None.  Findings: No acute fracture.  No dislocation.  No obvious joint effusion.  Osteopenia.  IMPRESSION: No acute bony pathology.   Original Report Authenticated By: Jolaine Click, M.D.    Dg Wrist 2 Views Left  09/20/2012  *RADIOLOGY REPORT*  Clinical Data: Left arm pain with abrasions at left elbow  LEFT WRIST - 2 VIEW  Comparison: Concurrently obtained radiographs of the shoulder and elbow  Findings: The bones are diffusely osteopenic which can limit evaluation for nondisplaced fracture.  AP and lateral views of the  wrist demonstrate no acute fracture, or malalignment.  The carpus appears congruent.  Mild soft tissue swelling about the wrist.  IMPRESSION:  1. Mild soft tissue swelling about the wrist without evidence of acute fracture or malalignment.  2.  Advanced osteopenia which can limits the sensitivity for detection of occult fracture.  If clinical concern persists for fracture consider repeat imaging in 10 - 14 days as occult fracture may become more evident secondary to bony resorption and callus formation.    Original Report Authenticated By: Malachy Moan, M.D.    Dg Hip Complete Left  09/20/2012  *RADIOLOGY REPORT*  Clinical Data: Left hip pain  LEFT HIP - COMPLETE 2+ VIEW  Comparison: None.  Findings: No acute displaced transcervical fracture through the mid left femoral neck.  The distal fracture fragment is displaced superiorly with respect to the femoral head by one half shaft width.  The remainder of the bony pelvis appears intact.  The femoral head is located on cross-table lateral view.  Unremarkable bowel gas pattern.  IMPRESSION:  Acute transcervical fracture of the left femoral neck.  The femur is displaced superiorly by one half shaft width with respect to the femoral head.   Original Report Authenticated By: Malachy Moan, M.D.    Ct Head Wo Contrast  09/20/2012  *RADIOLOGY REPORT*  Clinical Data:  Syncopal episode resulting in a fall, hitting her head with subsequent headache.  CT HEAD WITHOUT CONTRAST CT MAXILLOFACIAL WITHOUT CONTRAST CT CERVICAL SPINE WITHOUT CONTRAST  Technique:  Multidetector CT imaging of the head, cervical spine, and maxillofacial structures were performed using the standard protocol without intravenous contrast. Multiplanar CT image reconstructions of the cervical spine and maxillofacial structures were also generated.  Comparison:  Previous examinations.  CT HEAD  Findings: No significant change in an old left middle cerebral artery distribution infarct with associated midline shift to the right.  An old right caudate body lacunar infarct is again demonstrated.  There is a left facial laceration and associated soft tissue blood and air.  No skull fracture, intracranial hemorrhage or paranasal sinus air-fluid levels are seen.  There is minimal left maxillary sinus mucosal thickening.  IMPRESSION:  1.  No skull fracture or intracranial hemorrhage. 2.  Stable atrophy and old infarcts. 3.  Left facial laceration.  CT MAXILLOFACIAL  Findings:  Left facial soft tissue  laceration with associated blood and air in the subcutaneous fat.  No fractures or paranasal sinus air-fluid levels.  IMPRESSION: Left facial soft tissue laceration without fracture.  CT CERVICAL SPINE  Findings:   Multilevel degenerative changes are stable.  No prevertebral soft tissue swelling, fractures or subluxations. Stable right apical pleural thickening and surgical staples.  IMPRESSION:  1.  No fracture or subluxation. 2.  Stable degenerative changes.   Original Report Authenticated By: Beckie Salts, M.D.    Ct Cervical Spine Wo Contrast  09/20/2012  *RADIOLOGY REPORT*  Clinical Data:  Syncopal episode resulting in a fall, hitting her head with subsequent headache.  CT HEAD WITHOUT CONTRAST CT MAXILLOFACIAL WITHOUT CONTRAST CT CERVICAL SPINE WITHOUT CONTRAST  Technique:  Multidetector CT imaging of the head, cervical spine, and maxillofacial structures were performed using the standard protocol without intravenous contrast. Multiplanar CT image reconstructions of the cervical spine and maxillofacial structures were also generated.  Comparison:  Previous examinations.  CT HEAD  Findings: No significant change in an old left middle cerebral artery distribution infarct with associated midline shift to the right.  An old right caudate body lacunar infarct is  again demonstrated.  There is a left facial laceration and associated soft tissue blood and air.  No skull fracture, intracranial hemorrhage or paranasal sinus air-fluid levels are seen.  There is minimal left maxillary sinus mucosal thickening.  IMPRESSION:  1.  No skull fracture or intracranial hemorrhage. 2.  Stable atrophy and old infarcts. 3.  Left facial laceration.  CT MAXILLOFACIAL  Findings:  Left facial soft tissue laceration with associated blood and air in the subcutaneous fat.  No fractures or paranasal sinus air-fluid levels.  IMPRESSION: Left facial soft tissue laceration without fracture.  CT CERVICAL SPINE  Findings:   Multilevel  degenerative changes are stable.  No prevertebral soft tissue swelling, fractures or subluxations. Stable right apical pleural thickening and surgical staples.  IMPRESSION:  1.  No fracture or subluxation. 2.  Stable degenerative changes.   Original Report Authenticated By: Beckie Salts, M.D.    Dg Chest Port 1 View  09/20/2012  *RADIOLOGY REPORT*  Clinical Data: Syncope resulting in a fall.  PORTABLE CHEST - 1 VIEW  Comparison: 11/15/2011.  Findings: Stable enlarged cardiac silhouette and hyperexpanded lungs.  The lungs remain clear.  Right apical surgical staples and pleural thickening are unchanged.  Mild left apical pleural thickening is again demonstrated.  Mild scoliosis.  IMPRESSION: No acute abnormality.  Stable cardiomegaly and changes of COPD.   Original Report Authenticated By: Beckie Salts, M.D.    Dg Shoulder Left  09/20/2012  *RADIOLOGY REPORT*  Clinical Data: History of fall complaining of left shoulder pain.  LEFT SHOULDER - 2+ VIEW  Comparison: No priors.  Findings: Three views of the left shoulder demonstrate no acute displaced fracture, subluxation, dislocation, joint or soft tissue abnormality.  IMPRESSION: 1.  No acute radiographic abnormality of the left shoulder.   Original Report Authenticated By: Trudie Reed, M.D.    Ct Maxillofacial Wo Cm  09/20/2012  *RADIOLOGY REPORT*  Clinical Data:  Syncopal episode resulting in a fall, hitting her head with subsequent headache.  CT HEAD WITHOUT CONTRAST CT MAXILLOFACIAL WITHOUT CONTRAST CT CERVICAL SPINE WITHOUT CONTRAST  Technique:  Multidetector CT imaging of the head, cervical spine, and maxillofacial structures were performed using the standard protocol without intravenous contrast. Multiplanar CT image reconstructions of the cervical spine and maxillofacial structures were also generated.  Comparison:  Previous examinations.  CT HEAD  Findings: No significant change in an old left middle cerebral artery distribution infarct with associated  midline shift to the right.  An old right caudate body lacunar infarct is again demonstrated.  There is a left facial laceration and associated soft tissue blood and air.  No skull fracture, intracranial hemorrhage or paranasal sinus air-fluid levels are seen.  There is minimal left maxillary sinus mucosal thickening.  IMPRESSION:  1.  No skull fracture or intracranial hemorrhage. 2.  Stable atrophy and old infarcts. 3.  Left facial laceration.  CT MAXILLOFACIAL  Findings:  Left facial soft tissue laceration with associated blood and air in the subcutaneous fat.  No fractures or paranasal sinus air-fluid levels.  IMPRESSION: Left facial soft tissue laceration without fracture.  CT CERVICAL SPINE  Findings:   Multilevel degenerative changes are stable.  No prevertebral soft tissue swelling, fractures or subluxations. Stable right apical pleural thickening and surgical staples.  IMPRESSION:  1.  No fracture or subluxation. 2.  Stable degenerative changes.   Original Report Authenticated By: Beckie Salts, M.D.     Medications: Scheduled Meds:   . atorvastatin  10 mg Oral q1800  . docusate  sodium  100 mg Oral BID  . feeding supplement  237 mL Oral BID BM  . levETIRAcetam  500 mg Oral BID  . levothyroxine  50 mcg Oral QAC breakfast  . lidocaine-EPINEPHrine  20 mL Other Once  . mirtazapine  15 mg Oral QHS  . multivitamin with minerals  1 tablet Oral Q1200  . polycarbophil  625 mg Oral Q1200  . polyethylene glycol  17 g Oral Daily  . sodium chloride  3 mL Intravenous Q12H      LOS: 1 day   Kaison Mcparland M.D. [facility] 09/21/2012, 3:33 PM Pager: [phone]  If 7PM-7AM, please contact night-coverage [email or web address] Password [email or web address]

## 2012-09-21 NOTE — Op Note (Signed)
09/20/2012 - 09/21/2012  2:24 PM  PATIENT:  [name]  79 y.o. female  MRN: [id number]  OPERATIVE REPORT  PRE-OPERATIVE DIAGNOSIS:  Left Hip Fracture, closed transcervical  POST-OPERATIVE DIAGNOSIS:  Left transcervical hip fracture. PROCEDURE:  Procedure(s):Depuy summit press fit Unipolar left hip hemiarthroplasty, #4 femoral stem, +0 neck, 47mm unipolar femoral head.    SURGEON:  Kerrin Champagne, MD    ANESTHESIA:  General, Dr. Michelle Piper.    COMPLICATIONS:  None.     COMPONENTS:Depuy summit press fit Unipolar left hip hemiarthroplasty, #4 femoral stem, +0 neck, 47mm unipolar femoral head.  EBL: 300 cc   PROCEDURE IN DETAIL: The patient was met in the holding area and  identified. The appropriate left  hip was identified and marked at the operative site. Preoperative antibiotics 2 g Ancef were given . The patient was then transported to the OR and  placed under general l anesthesia. At that point, the patient was  placed in the lateral decubitus position with the operative left side up and  secured to the operating room table with the Innomed hip system.  The operative lower extremity was prepped from the iliac crest to the distal  leg with DuraPrep. Sterile draping was  performed.  A routine southern incision was utilized and via sharp dissection  carried down to the subcutaneous tissue. Gross bleeders were Bovie  coagulated. The iliotibial band was quickly identified and incised  along the length of the skin incision. Self-retaining retractors were  inserted. With the hip internally rotated, the short external rotators  were identified. Tendinous structures were tagged with 0 Ethibond  suture. The hip capsule was identified and incised along the femoral  neck and head. Femoral neck fracture was identified displaced posteriorly and into varus angulation. The femoral neck was then osteotomized using a  calcar guide and femoral head removed from the acetabulum using a  corkscrew.The femoral neck osteotomy was placed about 1 fingerbreadth proximal to the lesser trochanter. Acetabulum was then carefully debrided of bony fracture  fragments that were attached to synovium. Irrigation was carried out of the acetabulum any small fragments removed. The acetabulum was then trialed following measurement of the resected femoral head initial 46 mm head exam and felt to be too loose and 47 mm trial was carried out which demonstrated excellent fit. A 47 mm head for unipolar implant was chosen. A starter hole was then made through the piriformis fossa. Canal finder was utilized then lateralizing reamer. Reaming was  performed to the size #4. had nice endosteal purchase. Rasping was performed sequentially to the appropriate #4 uncemented rasp. This rasp was then left in place and calcar planing was carried out using a small calcar planer. A reduction was then carried out using +0  with 47 mm head was able to be reduced and showed excellent leg  Length with ability to flex well over 120 and internally rotated almost 80 without signs of subluxation. Excellent stability and full extension and external rotation noted. The #4 femoral stem with a +0 neck and 47 mm head was then chosen. The trial components were then removed. The joint was copiously  irrigated with saline solution.The femoral component was then impacted onto the calcar. Wound was again irrigated.   We cleaned the [facility] taper neck and  inserted the final head. This was reduced, and through a full range of motion, it was perfectly stable and there was no subluxation. There was no evidence of  instability. Flexion was possible over  115 internal rotation nearly 80 without dislocation. It had a very nice construct.  Wound was then irrigated with saline solution. The capsule was closed  anatomically with #1 Ethibond. The piriformis tendon was then carefully approximated the greater tuberosity and insertion of the gluteus muscles  posteriorly. The wound was again irrigated with saline  solution. Hemovac drain placed over the posterior external rotators of the hip, exiting the anterolateral proximal thigh distal to the skin incision.The iliotibial band was closed with interrupted #1 Ethibond, subcu  was closed with #1 Vicryl and 0 Vicryl, subcutaneous layers were reapproximated with interrupted 0 and 2-0 Vicryl sutures.Skin was closed with a running subcutaneous suture of 4-0 Vicryl and Dermabond. All instruments and sponge counts were correct Mepilex dressing was applied. Knee immobilizer was placed with posterior stays removed.. The patient was then placed in the supine position,  awoken, placed on the operating stretcher, and returned to the  postanesthesia recovery room in satisfactory condition.       NITKA,JAMES E  09/21/2012, 2:24 PM

## 2012-09-21 NOTE — Anesthesia Procedure Notes (Signed)
Procedure Name: Intubation Date/Time: 09/21/2012 2:23 PM Performed by: Alanda Amass A Pre-anesthesia Checklist: Patient identified, Timeout performed, Emergency Drugs available, Suction available and Patient being monitored Patient Re-evaluated:Patient Re-evaluated prior to inductionOxygen Delivery Method: Circle system utilized Preoxygenation: Pre-oxygenation with 100% oxygen Intubation Type: IV induction Ventilation: Mask ventilation without difficulty Grade View: Grade I Tube type: Oral Tube size: 7.5 mm Number of attempts: 1 Airway Equipment and Method: Stylet and Video-laryngoscopy Placement Confirmation: ETT inserted through vocal cords under direct vision,  breath sounds checked- equal and bilateral and positive ETCO2 Secured at: 21 cm Tube secured with: Tape Dental Injury: Teeth and Oropharynx as per pre-operative assessment

## 2012-09-21 NOTE — Transfer of Care (Signed)
Immediate Anesthesia Transfer of Care Note  Patient: [name]  Procedure(s) Performed: Procedure(s) (LRB) with comments: TOTAL HIP ARTHROPLASTY (Left) - Depuy Pore Coated Stem  Patient Location: PACU  Anesthesia Type:General  Level of Consciousness: sedated  Airway & Oxygen Therapy: Patient Spontanous Breathing and Patient connected to nasal cannula oxygen  Post-op Assessment: Report given to PACU RN and Post -op Vital signs reviewed and stable  Post vital signs: Reviewed and stable  Complications: No apparent anesthesia complications

## 2012-09-21 NOTE — Plan of Care (Signed)
Problem: Phase I Progression Outcomes Goal: Pre op Protime within normal limits Outcome: Progressing INR 2.24  Pt given Vit K 2mg  IV in preparation for surgery today

## 2012-09-21 NOTE — Progress Notes (Signed)
Transported via bed to OR. RN and family with patient. Mamie Levers

## 2012-09-21 NOTE — Progress Notes (Signed)
SLP Cancellation Note  Patient Details Name: [name] MRN: [id number] DOB: 1931-01-16   Cancelled treatment:       Reason Eval/Treat Not Completed: Patient at procedure or test/unavailable. Pt NPO after 8 am for hip fx surgery. Will evaluate pt tomorrow/when appropriate.   Harlon Ditty, [place] CCC-SLP [phone]  Claudine Mouton 09/21/2012, 7:46 AM

## 2012-09-21 NOTE — Progress Notes (Signed)
PT Cancellation Note  Patient Details Name: [name] MRN: [id number] DOB: Sep 10, 1931   Cancelled Treatment:    Reason Eval/Treat Not Completed: Patient not medically ready. Pt plan for hip fx surgery. Will complete evaluation following surgery.  09/21/2012 Milana Kidney DPT PAGER: [phone] OFFICE: [phone]     Milana Kidney 09/21/2012, 7:34 AM

## 2012-09-21 NOTE — Progress Notes (Signed)
Utilization Review Completed.[name] T1/11/2012   

## 2012-09-21 NOTE — Anesthesia Preprocedure Evaluation (Addendum)
Anesthesia Evaluation  Patient identified by MRN, date of birth, ID band Patient awake    Reviewed: Allergy & Precautions, H&P , NPO status , Patient's Chart, lab work & pertinent test results  Airway Mallampati: I TM Distance: >3 FB Neck ROM: Full    Dental  (+) Dental Advisory Given and Teeth Intact   Pulmonary          Cardiovascular hypertension, Pt. on medications + dysrhythmias Atrial Fibrillation     Neuro/Psych Seizures -,  CVA    GI/Hepatic   Endo/Other    Renal/GU      Musculoskeletal   Abdominal   Peds  Hematology   Anesthesia Other Findings Facial lacs, swelling lt. Side of face  Reproductive/Obstetrics                         Anesthesia Physical Anesthesia Plan  ASA: III  Anesthesia Plan: General   Post-op Pain Management:    Induction: Intravenous  Airway Management Planned: Oral ETT  Additional Equipment:   Intra-op Plan:   Post-operative Plan: Extubation in OR  Informed Consent: I have reviewed the patients History and Physical, chart, labs and discussed the procedure including the risks, benefits and alternatives for the proposed anesthesia with the patient or authorized representative who has indicated his/her understanding and acceptance.     Plan Discussed with: CRNA and Surgeon  Anesthesia Plan Comments:         Anesthesia Quick Evaluation

## 2012-09-21 NOTE — Progress Notes (Signed)
Patient received from OR. Assessed per flowsheet. Denies any distress at this time. VSS. Call bell and husband near. [name]

## 2012-09-21 NOTE — Progress Notes (Signed)
Patient ID: [name], female   DOB: 1930-12-18, 79 y.o.   MRN: [id number] Spoke with OR scheduling and will be able to proceed with surgery At 2 PM today. INR 1.5 is acceptable for hip hemiarthroplasty. Risks and benefits have been described to patient and her husband.

## 2012-09-21 NOTE — Progress Notes (Signed)
ANTICOAGULATION CONSULT NOTE - Initial Consult  Pharmacy Consult for coumadin and heparin Indication: hx of atrial fib and CVA  No Known Allergies  Patient Measurements: Weight: 114 lb 6.7 oz (51.9 kg) Heparin Dosing Weight: 51.9 kg  Vital Signs: Temp: 97.8 F (36.6 C) (01/03 1715) Temp src: Oral (01/03 1305) BP: 110/57 mmHg (01/03 1715) Pulse Rate: 94  (01/03 1715)  Labs:  Basename 09/21/12 0834 09/20/12 2322 09/20/12 1811 09/20/12 1403 09/20/12 0943  HGB 12.8 -- -- 13.6 --  HCT 38.1 -- -- 40.0 40.6  PLT 121* -- -- -- 164  APTT -- -- -- -- --  LABPROT 18.0* -- -- -- 23.8*  INR 1.54* -- -- -- 2.24*  HEPARINUNFRC -- -- -- -- --  CREATININE 0.61 -- -- 0.70 --  CKTOTAL -- -- -- -- --  CKMB -- -- -- -- --  TROPONINI <0.30 <0.30 <0.30 -- --    The CrCl is unknown because both a height and weight (above a minimum accepted value) are required for this calculation.   Medical History: Past Medical History  Diagnosis Date  . Dysphagia   . Atrial fibrillation     a. Dx 05/2011 at time of CVA.  Marland Kitchen Hyperlipidemia   . HTN (hypertension)   . Hypothyroidism   . Stroke     a. 05/2011 right MCA CVA in setting of new onset afib s/p thrombolysis/thrombolytics. b. Recurrent CVA 10/2011 in setting of subtherap.INR.  Marland Kitchen Pneumonia     years ago  . Seizures     a. At time of 10/2011 CVA with slowing seen on EEG.    Medications:  Scheduled:    . atorvastatin  10 mg Oral q1800  . [COMPLETED]  ceFAZolin (ANCEF) IV  2 g Intravenous On Call to OR  .  ceFAZolin (ANCEF) IV  2 g Intravenous Q6H  . docusate sodium  100 mg Oral BID  . feeding supplement  237 mL Oral BID BM  . levETIRAcetam  500 mg Oral BID  . levothyroxine  50 mcg Oral QAC breakfast  . mirtazapine  15 mg Oral QHS  . multivitamin with minerals  1 tablet Oral Q1200  . [COMPLETED] phytonadione (VITAMIN K) IV  2 mg Intravenous Once  . polycarbophil  625 mg Oral Q1200  . polyethylene glycol  17 g Oral Daily  . [EXPIRED]  potassium chloride SA      . sodium chloride  3 mL Intravenous Q12H  . [DISCONTINUED] lidocaine-EPINEPHrine  20 mL Other Once  . [DISCONTINUED] polyethylene glycol powder  17 g Oral Daily   Infusions:    . sodium chloride 75 mL/hr at 09/20/12 1800  . diltiazem (CARDIZEM) infusion 5 mg/hr (09/21/12 1410)  . [DISCONTINUED] lactated ringers 50 mL/hr at 09/21/12 1330    Assessment: 79 yo female with hx of atrial fib and CVA will be continued on coumadin while bridged with heparin.  Was on coumadin 7.5mg  po qday prior to admission with an admitting INR of 2.24.  Last coumadin dose was 09/19/12.  H/H 12.8/38.1 and Plt 121.  INR today was 1.54 Goal of Therapy:  Heparin level 0.3-0.5 units/ml Monitor platelets by anticoagulation protocol: Yes   Plan:  1) Start heparin drip at 650 units/hr. No bolus 2) Coumadin 7.5mg  po x1 3) 8hr heparin level. 4) Daily heparin level, CBC and INR   Margree Gimbel, Tsz-Yin 09/21/2012,5:55 PM

## 2012-09-21 NOTE — Progress Notes (Signed)
INITIAL NUTRITION ASSESSMENT  DOCUMENTATION CODES Per approved criteria  -Underweight   INTERVENTION:  Ensure Complete twice daily between meals (350 kcals, 13 gm protein per 8 fl oz bottle) RD to follow for nutrition care plan  NUTRITION DIAGNOSIS: Inadequate oral intake related to poor appetite as evidenced by PO intake 0%  Goal: Oral intake with meals & supplements to meet >/= 90% of estimated nutrition needs  Monitor:  PO & supplemental intake, weight, labs, I/O's  Reason for Assessment: Consult  79 y.o. female  Admitting Dx: Hip fracture  ASSESSMENT: Patient admitted after passed out and falling, hitting her left hip; presented to ER where she was diagnosed with left femoral neck fracture, laceration around the head; states her appetite is poor; eating well PTA; drinks Ensure supplements at home; would benefit from addition during hospitalization for healing & recovery ---> RD to order.  Height: Ht Readings from Last 1 Encounters:  04/26/12 5\' 8"  (1.727 m)    Weight: Wt Readings from Last 1 Encounters:  09/20/12 114 lb 6.7 oz (51.9 kg)    Ideal Body Weight: 63.6 kg  % Ideal Body Weight: 82%  Wt Readings from Last 10 Encounters:  09/20/12 114 lb 6.7 oz (51.9 kg)  09/20/12 114 lb 6.7 oz (51.9 kg)  04/26/12 106 lb 6.4 oz (48.263 kg)  11/16/11 110 lb (49.896 kg)  09/30/11 112 lb (50.803 kg)    Usual Body Weight: ---  % Usual Body Weight: ---  BMI:  17.5 kg/m2  Estimated Nutritional Needs: Kcal: 1400-1600 Protein: 65-75 gm Fluid: > 1.5 L  Skin: laceration to head  Diet Order: NPO  EDUCATION NEEDS: -No education needs identified at this time   Intake/Output Summary (Last 24 hours) at 09/21/12 1447 Last data filed at 09/21/12 1307  Gross per 24 hour  Intake      0 ml  Output   2175 ml  Net  -2175 ml    Last BM: 1/1  Labs:   Lab 09/21/12 0834 09/20/12 1403  NA 140 142  K 3.7 3.3*  CL 106 106  CO2 22 --  BUN 12 10  CREATININE 0.61  0.70  CALCIUM 9.1 --  MG -- --  PHOS -- --  GLUCOSE 88 104*     Scheduled Meds:   . atorvastatin  10 mg Oral q1800  . docusate sodium  100 mg Oral BID  . levETIRAcetam  500 mg Oral BID  . levothyroxine  50 mcg Oral QAC breakfast  . lidocaine-EPINEPHrine  20 mL Other Once  . mirtazapine  15 mg Oral QHS  . multivitamin with minerals  1 tablet Oral Q1200  . polycarbophil  625 mg Oral Q1200  . polyethylene glycol  17 g Oral Daily  . sodium chloride  3 mL Intravenous Q12H    Continuous Infusions:   . sodium chloride 75 mL/hr at 09/20/12 1800  . diltiazem (CARDIZEM) infusion 5 mg/hr (09/21/12 1410)  . lactated ringers 50 mL/hr at 09/21/12 1330    Past Medical History  Diagnosis Date  . Dysphagia   . Atrial fibrillation     a. Dx 05/2011 at time of CVA.  Marland Kitchen Hyperlipidemia   . HTN (hypertension)   . Hypothyroidism   . Stroke     a. 05/2011 right MCA CVA in setting of new onset afib s/p thrombolysis/thrombolytics. b. Recurrent CVA 10/2011 in setting of subtherap.INR.  Marland Kitchen Pneumonia     years ago  . Seizures     a. At time  of 10/2011 CVA with slowing seen on EEG.    Past Surgical History  Procedure Date  . Back surgery   . Abdominal hysterectomy   . Pleural scarification     Kirkland Hun, RD, LDN Pager #: [phone] After-Hours Pager #: [phone]

## 2012-09-21 NOTE — Progress Notes (Signed)
Orthopedic Tech Progress Note Patient Details:  [name] 1930/11/16 [id number] Applied overhead frame to bed.     Jennye Moccasin 09/21/2012, 5:11 PM

## 2012-09-21 NOTE — Progress Notes (Signed)
   Subjective:  Denies CP or dyspnea   Objective:  Filed Vitals:   09/20/12 1751 09/20/12 2023 09/20/12 2345 09/21/12 0355  BP: 130/81 138/79 143/91 115/62  Pulse: 121 93 108 72  Temp: 98.7 F (37.1 C) 98.4 F (36.9 C) 99.1 F (37.3 C) 98.5 F (36.9 C)  TempSrc:  Oral Oral Oral  Resp: 16 16 17 17   Weight:   114 lb 6.7 oz (51.9 kg)   SpO2: 94% 98% 100% 100%    Intake/Output from previous day:  Intake/Output Summary (Last 24 hours) at 09/21/12 0700 Last data filed at 09/20/12 1854  Gross per 24 hour  Intake      0 ml  Output    750 ml  Net   -750 ml    Physical Exam: Physical exam: Well-developed well-nourished in no acute distress.  Skin is warm and dry.  HEENT large hematoma and laceration over left eye Neck is supple.  Chest is clear to auscultation anteriorly Cardiovascular exam is irregular Abdominal exam nontender or distended. No masses palpated. Extremities show no edema. S/p hip fx neuro grossly intact    Lab Results: Basic Metabolic Panel:  Basename 09/20/12 1403  NA 142  K 3.3*  CL 106  CO2 --  GLUCOSE 104*  BUN 10  CREATININE 0.70  CALCIUM --  MG --  PHOS --   CBC:  Basename 09/20/12 1403 09/20/12 0943  WBC -- 8.3  NEUTROABS -- 5.4  HGB 13.6 13.1  HCT 40.0 40.6  MCV -- 91.6  PLT -- 164   Cardiac Enzymes:  Basename 09/20/12 2322 09/20/12 1811  CKTOTAL -- --  CKMB -- --  CKMBINDEX -- --  TROPONINI <0.30 <0.30     Assessment/Plan:  1 atrial fibrillation-Coumadin is on hold for upcoming hip surgery. Continue Cardizem for rate control. Once she is stable from a hemostasis standpoint postoperatively would resume heparin as she has had a CVA with her atrial fibrillation in the past. Her heparin will need to be continued until her INR is therapeutic. 2 syncope-continue telemetry. Would arrange outpatient CardioNet at time of discharge. 3 hip fracture-management per orthopedics. INR is pending this a.m. 4 hypertension-continue  Cardizem.  [name] 09/21/2012, 7:00 AM

## 2012-09-21 NOTE — Care Management Note (Unsigned)
    Page 1 of 1   09/21/2012     4:46:25 PM   CARE MANAGEMENT NOTE 09/21/2012  Patient:  [name], [name]   Account Number:  [id number]  Date Initiated:  09/21/2012  Documentation initiated by:  Thy Gullikson  Subjective/Objective Assessment:   PT ADM S/P SYNCOPAL EPISODE AND FALL RESULTING IN FRACTURED HIP ON 09/20/12.  PTA, PT RESIDES AT [facility] WITH HUSBAND.     Action/Plan:   WILL CONSULT CSW TO FACILITATE DC TO SNF, AS PT WILL NEED REHAB S/P ORIF.   Anticipated DC Date:  09/26/2012   Anticipated DC Plan:  SKILLED NURSING FACILITY  In-house referral  Clinical Social Worker      DC Planning Services  CM consult      Choice offered to / List presented to:             Status of service:  In process, will continue to follow Medicare Important Message given?   (If response is "NO", the following Medicare IM given date fields will be blank) Date Medicare IM given:   Date Additional Medicare IM given:    Discharge Disposition:    Per UR Regulation:  Reviewed for med. necessity/level of care/duration of stay  If discussed at Long Length of Stay Meetings, dates discussed:    Comments:

## 2012-09-21 NOTE — Brief Op Note (Signed)
09/20/2012 - 09/21/2012  3:59 PM  PATIENT:  [name]  79 y.o. female  PRE-OPERATIVE DIAGNOSIS:  Left Hip Fracture, closed transcervical.  POST-OPERATIVE DIAGNOSIS: Left transcervical hip fracture. PROCEDURE:Depuy summit press fit Unipolar left hip hemiarthroplasty, #4 femoral stem, +0 neck, 47mm unipolar femoral head.   SURGEON:  Surgeon(s) and Role:    * Kerrin Champagne, MD - Primary  ANESTHESIA:   general Dr. Michelle Piper.  EBL:  Total I/O In: 2150 [I.V.:2150] Out: 2300 [Urine:2000; Blood:300]  BLOOD ADMINISTERED:none  DRAINS: (one) Hemovact drain(s) in the left anterior lateral hip with  Suction Open  Foley to straight drain.  LOCAL MEDICATIONS USED:  NONE  SPECIMEN:  No Specimen  DISPOSITION OF SPECIMEN:  N/A  COUNTS:  YES  TOURNIQUET:  * No tourniquets in log *  DICTATION: .Dragon Dictation  PLAN OF CARE: Admit to inpatient   PATIENT DISPOSITION:  PACU - hemodynamically stable.   Delay start of Pharmacological VTE agent (>24hrs) due to surgical blood loss or risk of bleeding: no

## 2012-09-21 NOTE — Progress Notes (Signed)
Patient was seen and examined in the preop holding area. There has been no interval  Change in this patient's exam preop  history and physical exam  Lab tests and images have been examined and reviewed.  The Risks benefits and alternative treatments have been discussed  extensively,questions answered.  The patient has elected to undergo the discussed surgical treatment. 

## 2012-09-21 NOTE — Progress Notes (Signed)
Subjective:    Patient reports pain as marked.  Severe when she moves the left hip. NPO NOW   Objective:   VITALS:  Temp:  [98 F (36.7 C)-99.1 F (37.3 C)] 98.5 F (36.9 C) (01/03 0355) Pulse Rate:  [72-121] 72  (01/03 0355) Resp:  [7-21] 17  (01/03 0355) BP: (115-148)/(50-91) 115/62 mmHg (01/03 0355) SpO2:  [94 %-100 %] 100 % (01/03 0355) Weight:  [51.9 kg (114 lb 6.7 oz)] 51.9 kg (114 lb 6.7 oz) (01/02 2345)  Neurologically intact ABD soft Sensation intact distally Intact pulses distally Dorsiflexion/Plantar flexion intact Compartment soft   LABS  Basename 09/20/12 1403 09/20/12 0943  HGB 13.6 13.1  WBC -- 8.3  PLT -- 164    Basename 09/20/12 1403  NA 142  K 3.3*  CL 106  CO2 --  BUN 10  CREATININE 0.70  GLUCOSE 104*    Basename 09/20/12 0943  LABPT --  INR 2.24*     Assessment/Plan: Left transcervical hip fracture closed. Anticoagulation being reversed to allow for surgery, lab pending this am.  Check PT/INR if less than or equal to 1.7 will proceed with surgery. Tentatively scheduled for after 2 PM with OR.  Chidubem Chaires E 09/21/2012, 8:20 AM

## 2012-09-21 NOTE — Progress Notes (Signed)
Post operative anticoagulation was discussed with Dr. Antoine Poche 1/2/20014 and he recommended restarting coumadin ASAP Post op with a heparin drip bridge due to past history of embolic CVA due to atrial fibrillation.

## 2012-09-22 DIAGNOSIS — Z8673 Personal history of transient ischemic attack (TIA), and cerebral infarction without residual deficits: Secondary | ICD-10-CM

## 2012-09-22 LAB — BASIC METABOLIC PANEL
BUN: 9 mg/dL (ref 6–23)
CO2: 26 mEq/L (ref 19–32)
Glucose, Bld: 106 mg/dL — ABNORMAL HIGH (ref 70–99)
Potassium: 3 mEq/L — ABNORMAL LOW (ref 3.5–5.1)
Sodium: 138 mEq/L (ref 135–145)

## 2012-09-22 LAB — CBC
HCT: 32.3 % — ABNORMAL LOW (ref 36.0–46.0)
Hemoglobin: 11 g/dL — ABNORMAL LOW (ref 12.0–15.0)
MCH: 30.6 pg (ref 26.0–34.0)
MCHC: 34.1 g/dL (ref 30.0–36.0)
MCV: 89.7 fL (ref 78.0–100.0)
RBC: 3.6 MIL/uL — ABNORMAL LOW (ref 3.87–5.11)

## 2012-09-22 LAB — HEPARIN LEVEL (UNFRACTIONATED): Heparin Unfractionated: 0.29 IU/mL — ABNORMAL LOW (ref 0.30–0.70)

## 2012-09-22 MED ORDER — DILTIAZEM HCL ER COATED BEADS 240 MG PO CP24
240.0000 mg | ORAL_CAPSULE | Freq: Every day | ORAL | Status: DC
  Administered 2012-09-22 – 2012-09-29 (×8): 240 mg via ORAL
  Filled 2012-09-22 (×10): qty 1

## 2012-09-22 MED ORDER — WARFARIN SODIUM 10 MG PO TABS
10.0000 mg | ORAL_TABLET | Freq: Once | ORAL | Status: AC
  Administered 2012-09-22: 10 mg via ORAL
  Filled 2012-09-22: qty 1

## 2012-09-22 MED ORDER — POTASSIUM CHLORIDE CRYS ER 20 MEQ PO TBCR
40.0000 meq | EXTENDED_RELEASE_TABLET | Freq: Once | ORAL | Status: AC
  Administered 2012-09-22: 40 meq via ORAL
  Filled 2012-09-22: qty 2

## 2012-09-22 NOTE — Progress Notes (Signed)
ANTICOAGULATION CONSULT NOTE - follow up  Pharmacy Consult for coumadin and heparin Indication: hx of atrial fib and CVA  No Known Allergies  Patient Measurements: Weight: 114 lb 6.7 oz (51.9 kg) Heparin Dosing Weight: 51.9 kg  Vital Signs: Temp: 98.7 F (37.1 C) (01/04 0512) Temp src: Oral (01/04 0512) BP: 116/88 mmHg (01/04 0512) Pulse Rate: 100  (01/04 0512)  Labs:  Basename 09/22/12 1009 09/22/12 0255 09/21/12 0834 09/20/12 2322 09/20/12 1811 09/20/12 1403 09/20/12 0943  HGB -- 11.0* 12.8 -- -- -- --  HCT -- 32.3* 38.1 -- -- 40.0 --  PLT -- 127* 121* -- -- -- 164  APTT -- -- -- -- -- -- --  LABPROT -- 15.9* 18.0* -- -- -- 23.8*  INR -- 1.30 1.54* -- -- -- 2.24*  HEPARINUNFRC 0.29* 0.28* -- -- -- -- --  CREATININE -- 0.60 0.61 -- -- 0.70 --  CKTOTAL -- -- -- -- -- -- --  CKMB -- -- -- -- -- -- --  TROPONINI -- -- <0.30 <0.30 <0.30 -- --    The CrCl is unknown because both a height and weight (above a minimum accepted value) are required for this calculation.   Assessment: 79 yo female with hx of atrial fib and CVA in September of  2012 w/ recurrent CVA 10/2011 w/ subtx INR resumed on coumadin while bridged with heparin after L hip hemiarthroplasty for L hip fx.  Was on coumadin 7.5mg  po qday prior to admission with an admitting INR of 2.24.  Last coumadin dose was 09/19/12.  Coumadin was reversed for surgery with vitamin K 2 mg IV 1/2 at 2330.   INR today is 1.30 2nd to the vitamin K give 1/2 preop.    Pt with large hematoma and laceration over left eye.  S/p repair of hip fx on 09/21/12.   Repeat heparin level on 650 units/hr = 0.29 which is at the low end of our desired goal. H/H 11/32.3. PLTC 127 - down from 164 on admission.   Goal of Therapy:  Heparin level 0.3-0.5 units/ml Monitor platelets by anticoagulation protocol: Yes INR 2-3   Plan:  1) increase heparin drip to 700 units/hr.  2) Coumadin  10 mg po x1 3) Daily heparin level, CBC and INR Herby Abraham,  Pharm.D. [phone] 09/22/2012 11:39 AM

## 2012-09-22 NOTE — Progress Notes (Signed)
   Subjective:  Denies CP or dyspnea   Objective:  Filed Vitals:   09/21/12 2100 09/22/12 0000 09/22/12 0119 09/22/12 0512  BP: 112/72 120/67  116/88  Pulse:  103  100  Temp:  98.5 F (36.9 C) 99 F (37.2 C) 98.7 F (37.1 C)  TempSrc:  Axillary Oral Oral  Resp:    17  Weight:      SpO2:  97%  100%    Intake/Output from previous day:  Intake/Output Summary (Last 24 hours) at 09/22/12 0844 Last data filed at 09/22/12 0713  Gross per 24 hour  Intake 2455.49 ml  Output   3100 ml  Net -644.51 ml    Physical Exam: Physical exam: Well-developed frail in no acute distress.  Skin is warm and dry.  HEENT large hematoma and laceration over left eye Neck is supple.  Chest is clear to auscultation anteriorly Cardiovascular exam is irregular Abdominal exam nontender or distended. No masses palpated. Extremities show no edema. S/p repair hip fx neuro grossly intact    Lab Results: Basic Metabolic Panel:  Basename 09/22/12 0255 09/21/12 0834  NA 138 140  K 3.0* 3.7  CL 105 106  CO2 26 22  GLUCOSE 106* 88  BUN 9 12  CREATININE 0.60 0.61  CALCIUM 8.6 9.1  MG -- --  PHOS -- --   CBC:  Basename 09/22/12 0255 09/21/12 0834 09/20/12 0943  WBC 9.0 9.3 --  NEUTROABS -- -- 5.4  HGB 11.0* 12.8 --  HCT 32.3* 38.1 --  MCV 89.7 90.3 --  PLT 127* 121* --   Cardiac Enzymes:  Basename 09/21/12 0834 09/20/12 2322 09/20/12 1811  CKTOTAL -- -- --  CKMB -- -- --  CKMBINDEX -- -- --  TROPONINI <0.30 <0.30 <0.30     Assessment/Plan:  1 atrial fibrillation-Heparin has been initiated; continue until INR therapeutic (coumadin resumed); change cardizem to po. 2 syncope-continue telemetry. Would arrange outpatient CardioNet at time of discharge. 3 hip fracture-management per orthopedics. INR is pending this a.m. 4 hypertension-continue Cardizem.  [name] 09/22/2012, 8:44 AM

## 2012-09-22 NOTE — Progress Notes (Signed)
Patient ID: [name]  female  ZOX:[id number]    DOB: 10-31-1930    DOA: 09/20/2012  PCP: [name], MD  Assessment/Plan: Principal Problem:   *Hip fracture - ortho following, POD #1  Active Problems:  Atrial fibrillation - placed back on heparin and coumadin - cont oral cardizem  Syncope - cardiology folowing, cont tele, out-patient work-up    History of CVA (cerebrovascular accident) - resumed warfarin and heparin gtt after surgery   Fall: will start PT/OT when ok'ed by ortho   Hypothyroidism; cont synthroid   HTN (hypertension): stable  Seizures: cont keppra  Hypokalemia: replaced  DVT Prophylaxis: started on coumadin and heparin  Code Status: FC  Disposition: will likely need SNF   Subjective: - pain controlled,   Objective: Weight change:   Intake/Output Summary (Last 24 hours) at 09/22/12 1303 Last data filed at 09/22/12 0948  Gross per 24 hour  Intake 2575.49 ml  Output   2300 ml  Net 275.49 ml   Blood pressure 116/88, pulse 100, temperature 98.7 F (37.1 C), temperature source Oral, resp. rate 17, weight 51.9 kg (114 lb 6.7 oz), SpO2 100.00%.  Physical Exam: General: Alert and awake, frail HEENT: marked right and left periorbital bruising and ecchymosis, suturing on forehead CVS: ireg ireg Chest: clear to auscultation bilaterally, no wheezing, rales or rhonchi Abdomen: soft NT, ND, NBS Extremities: no edema   Lab Results: Basic Metabolic Panel:  Lab 09/22/12 8119 09/21/12 0834  NA 138 140  K 3.0* 3.7  CL 105 106  CO2 26 22  GLUCOSE 106* 88  BUN 9 12  CREATININE 0.60 0.61  CALCIUM 8.6 9.1  MG -- --  PHOS -- --   CBC:  Lab 09/22/12 0255 09/21/12 0834 09/20/12 0943  WBC 9.0 9.3 --  NEUTROABS -- -- 5.4  HGB 11.0* 12.8 --  HCT 32.3* 38.1 --  MCV 89.7 90.3 --  PLT 127* 121* --   Cardiac Enzymes:  Lab 09/21/12 0834 09/20/12 2322 09/20/12 1811  CKTOTAL -- -- --  CKMB -- -- --  CKMBINDEX -- -- --  TROPONINI <0.30  <0.30 <0.30   BNP: No components found with this basename: POCBNP:2 CBG: No results found for this basename: GLUCAP:5 in the last 168 hours   Micro Results: No results found for this or any previous visit (from the past 240 hour(s)).  Studies/Results: Dg Elbow 2 Views Left  09/20/2012  *RADIOLOGY REPORT*  Clinical Data: Fall  LEFT ELBOW - 2 VIEW  Comparison: None.  Findings: No acute fracture.  No dislocation.  No obvious joint effusion.  Osteopenia.  IMPRESSION: No acute bony pathology.   Original Report Authenticated By: Jolaine Click, M.D.    Dg Wrist 2 Views Left  09/20/2012  *RADIOLOGY REPORT*  Clinical Data: Left arm pain with abrasions at left elbow  LEFT WRIST - 2 VIEW  Comparison: Concurrently obtained radiographs of the shoulder and elbow  Findings: The bones are diffusely osteopenic which can limit evaluation for nondisplaced fracture.  AP and lateral views of the wrist demonstrate no acute fracture, or malalignment.  The carpus appears congruent.  Mild soft tissue swelling about the wrist.  IMPRESSION:  1. Mild soft tissue swelling about the wrist without evidence of acute fracture or malalignment.  2.  Advanced osteopenia which can limits the sensitivity for detection of occult fracture.  If clinical concern persists for fracture consider repeat imaging in 10 - 14 days as occult fracture may become more evident secondary to bony  resorption and callus formation.   Original Report Authenticated By: Malachy Moan, M.D.    Dg Hip Complete Left  09/20/2012  *RADIOLOGY REPORT*  Clinical Data: Left hip pain  LEFT HIP - COMPLETE 2+ VIEW  Comparison: None.  Findings: No acute displaced transcervical fracture through the mid left femoral neck.  The distal fracture fragment is displaced superiorly with respect to the femoral head by one half shaft width.  The remainder of the bony pelvis appears intact.  The femoral head is located on cross-table lateral view.  Unremarkable bowel gas pattern.   IMPRESSION:  Acute transcervical fracture of the left femoral neck.  The femur is displaced superiorly by one half shaft width with respect to the femoral head.   Original Report Authenticated By: Malachy Moan, M.D.    Ct Head Wo Contrast  09/20/2012  *RADIOLOGY REPORT*  Clinical Data:  Syncopal episode resulting in a fall, hitting her head with subsequent headache.  CT HEAD WITHOUT CONTRAST CT MAXILLOFACIAL WITHOUT CONTRAST CT CERVICAL SPINE WITHOUT CONTRAST  Technique:  Multidetector CT imaging of the head, cervical spine, and maxillofacial structures were performed using the standard protocol without intravenous contrast. Multiplanar CT image reconstructions of the cervical spine and maxillofacial structures were also generated.  Comparison:  Previous examinations.  CT HEAD  Findings: No significant change in an old left middle cerebral artery distribution infarct with associated midline shift to the right.  An old right caudate body lacunar infarct is again demonstrated.  There is a left facial laceration and associated soft tissue blood and air.  No skull fracture, intracranial hemorrhage or paranasal sinus air-fluid levels are seen.  There is minimal left maxillary sinus mucosal thickening.  IMPRESSION:  1.  No skull fracture or intracranial hemorrhage. 2.  Stable atrophy and old infarcts. 3.  Left facial laceration.  CT MAXILLOFACIAL  Findings:  Left facial soft tissue laceration with associated blood and air in the subcutaneous fat.  No fractures or paranasal sinus air-fluid levels.  IMPRESSION: Left facial soft tissue laceration without fracture.  CT CERVICAL SPINE  Findings:   Multilevel degenerative changes are stable.  No prevertebral soft tissue swelling, fractures or subluxations. Stable right apical pleural thickening and surgical staples.  IMPRESSION:  1.  No fracture or subluxation. 2.  Stable degenerative changes.   Original Report Authenticated By: Beckie Salts, M.D.    Ct Cervical Spine Wo  Contrast  09/20/2012  *RADIOLOGY REPORT*  Clinical Data:  Syncopal episode resulting in a fall, hitting her head with subsequent headache.  CT HEAD WITHOUT CONTRAST CT MAXILLOFACIAL WITHOUT CONTRAST CT CERVICAL SPINE WITHOUT CONTRAST  Technique:  Multidetector CT imaging of the head, cervical spine, and maxillofacial structures were performed using the standard protocol without intravenous contrast. Multiplanar CT image reconstructions of the cervical spine and maxillofacial structures were also generated.  Comparison:  Previous examinations.  CT HEAD  Findings: No significant change in an old left middle cerebral artery distribution infarct with associated midline shift to the right.  An old right caudate body lacunar infarct is again demonstrated.  There is a left facial laceration and associated soft tissue blood and air.  No skull fracture, intracranial hemorrhage or paranasal sinus air-fluid levels are seen.  There is minimal left maxillary sinus mucosal thickening.  IMPRESSION:  1.  No skull fracture or intracranial hemorrhage. 2.  Stable atrophy and old infarcts. 3.  Left facial laceration.  CT MAXILLOFACIAL  Findings:  Left facial soft tissue laceration with associated blood and air in  the subcutaneous fat.  No fractures or paranasal sinus air-fluid levels.  IMPRESSION: Left facial soft tissue laceration without fracture.  CT CERVICAL SPINE  Findings:   Multilevel degenerative changes are stable.  No prevertebral soft tissue swelling, fractures or subluxations. Stable right apical pleural thickening and surgical staples.  IMPRESSION:  1.  No fracture or subluxation. 2.  Stable degenerative changes.   Original Report Authenticated By: Beckie Salts, M.D.    Dg Chest Port 1 View  09/20/2012  *RADIOLOGY REPORT*  Clinical Data: Syncope resulting in a fall.  PORTABLE CHEST - 1 VIEW  Comparison: 11/15/2011.  Findings: Stable enlarged cardiac silhouette and hyperexpanded lungs.  The lungs remain clear.  Right  apical surgical staples and pleural thickening are unchanged.  Mild left apical pleural thickening is again demonstrated.  Mild scoliosis.  IMPRESSION: No acute abnormality.  Stable cardiomegaly and changes of COPD.   Original Report Authenticated By: Beckie Salts, M.D.    Dg Shoulder Left  09/20/2012  *RADIOLOGY REPORT*  Clinical Data: History of fall complaining of left shoulder pain.  LEFT SHOULDER - 2+ VIEW  Comparison: No priors.  Findings: Three views of the left shoulder demonstrate no acute displaced fracture, subluxation, dislocation, joint or soft tissue abnormality.  IMPRESSION: 1.  No acute radiographic abnormality of the left shoulder.   Original Report Authenticated By: Trudie Reed, M.D.    Ct Maxillofacial Wo Cm  09/20/2012  *RADIOLOGY REPORT*  Clinical Data:  Syncopal episode resulting in a fall, hitting her head with subsequent headache.  CT HEAD WITHOUT CONTRAST CT MAXILLOFACIAL WITHOUT CONTRAST CT CERVICAL SPINE WITHOUT CONTRAST  Technique:  Multidetector CT imaging of the head, cervical spine, and maxillofacial structures were performed using the standard protocol without intravenous contrast. Multiplanar CT image reconstructions of the cervical spine and maxillofacial structures were also generated.  Comparison:  Previous examinations.  CT HEAD  Findings: No significant change in an old left middle cerebral artery distribution infarct with associated midline shift to the right.  An old right caudate body lacunar infarct is again demonstrated.  There is a left facial laceration and associated soft tissue blood and air.  No skull fracture, intracranial hemorrhage or paranasal sinus air-fluid levels are seen.  There is minimal left maxillary sinus mucosal thickening.  IMPRESSION:  1.  No skull fracture or intracranial hemorrhage. 2.  Stable atrophy and old infarcts. 3.  Left facial laceration.  CT MAXILLOFACIAL  Findings:  Left facial soft tissue laceration with associated blood and air in  the subcutaneous fat.  No fractures or paranasal sinus air-fluid levels.  IMPRESSION: Left facial soft tissue laceration without fracture.  CT CERVICAL SPINE  Findings:   Multilevel degenerative changes are stable.  No prevertebral soft tissue swelling, fractures or subluxations. Stable right apical pleural thickening and surgical staples.  IMPRESSION:  1.  No fracture or subluxation. 2.  Stable degenerative changes.   Original Report Authenticated By: Beckie Salts, M.D.     Medications: Scheduled Meds:    . atorvastatin  10 mg Oral q1800  . diltiazem  240 mg Oral Daily  . docusate sodium  100 mg Oral BID  . feeding supplement  237 mL Oral BID BM  . levETIRAcetam  500 mg Oral BID  . levothyroxine  50 mcg Oral QAC breakfast  . mirtazapine  15 mg Oral QHS  . multivitamin with minerals  1 tablet Oral Q1200  . polycarbophil  625 mg Oral Q1200  . polyethylene glycol  17 g Oral Daily  .  sodium chloride  3 mL Intravenous Q12H  . warfarin  10 mg Oral ONCE-1800  . Warfarin - Pharmacist Dosing Inpatient   Does not apply q1800      LOS: 2 days   Vietta Bonifield M.D. [facility] 09/22/2012, 1:03 PM Pager: [phone]  If 7PM-7AM, please contact night-coverage [email or web address] Password [email or web address]

## 2012-09-22 NOTE — Progress Notes (Signed)
ANTICOAGULATION CONSULT NOTE - Follow Up Consult  Pharmacy Consult for heparin Indication: recent CVA with h/o Afib  Labs:  Basename 09/22/12 0255 09/21/12 0834 09/20/12 2322 09/20/12 1811 09/20/12 1403 09/20/12 0943  HGB 11.0* 12.8 -- -- -- --  HCT 32.3* 38.1 -- -- 40.0 --  PLT 127* 121* -- -- -- 164  APTT -- -- -- -- -- --  LABPROT 15.9* 18.0* -- -- -- 23.8*  INR 1.30 1.54* -- -- -- 2.24*  HEPARINUNFRC 0.28* -- -- -- -- --  CREATININE -- 0.61 -- -- 0.70 --  CKTOTAL -- -- -- -- -- --  CKMB -- -- -- -- -- --  TROPONINI -- <0.30 <0.30 <0.30 -- --    Assessment/Plan:  79yo female slightly subtherapeutic on heparin with initial dosing though level drawn early and low goal, would expect level to rise.  Will continue gtt at current rate and confirm with additional level.  Colleen Can PharmD BCPS 09/22/2012,4:01 AM

## 2012-09-22 NOTE — Progress Notes (Signed)
Subjective: 1 Day Post-Op Procedure(s) (LRB): ARTHROPLASTY BIPOLAR HIP (Left) Patient reports pain as moderate.    Objective: Vital signs in last 24 hours: Temp:  [97.6 F (36.4 C)-99 F (37.2 C)] 98.7 F (37.1 C) (01/04 0512) Pulse Rate:  [85-124] 100  (01/04 0512) Resp:  [15-21] 17  (01/04 0512) BP: (104-134)/(45-88) 116/88 mmHg (01/04 0512) SpO2:  [97 %-100 %] 100 % (01/04 0512)  Intake/Output from previous day: 01/03 0701 - 01/04 0700 In: 2455.5 [I.V.:2355.5; IV Piggyback:100] Out: 2300 [Urine:2000; Blood:300] Intake/Output this shift: Total I/O In: 120 [P.O.:120] Out: 800 [Urine:800]   Basename 09/22/12 0255 09/21/12 0834 09/20/12 1403 09/20/12 0943  HGB 11.0* 12.8 13.6 13.1    Basename 09/22/12 0255 09/21/12 0834  WBC 9.0 9.3  RBC 3.60* 4.22  HCT 32.3* 38.1  PLT 127* 121*    Basename 09/22/12 0255 09/21/12 0834  NA 138 140  K 3.0* 3.7  CL 105 106  CO2 26 22  BUN 9 12  CREATININE 0.60 0.61  GLUCOSE 106* 88  CALCIUM 8.6 9.1    Basename 09/22/12 0255 09/21/12 0834  LABPT -- --  INR 1.30 1.54*    Neurologically intact  LL equal , dressing dry , sciatic intact.   Assessment/Plan: 1 Day Post-Op Procedure(s) (LRB): ARTHROPLASTY BIPOLAR HIP (Left) Up with therapy  [name] C 09/22/2012, 12:04 PM

## 2012-09-22 NOTE — Evaluation (Signed)
Physical Therapy Evaluation Patient Details Name: [name] MRN: [id number] DOB: 23-Jul-1931 Today's Date: 09/22/2012 Time: 4098-1191 PT Time Calculation (min): 19 min  PT Assessment / Plan / Recommendation Clinical Impression  Patient is a 79 yo female s/p hemiarthroplasty of Lt. hip fracture. Patient with facial lacerations and bruising, with left eye swollen shut.  Patient able to participate with PT minimally today.  Patient will benefit from acute PT to maximize independence and increase activity tolerance.  Recommend SNF for continued therapy at discharge.    PT Assessment  Patient needs continued PT services    Follow Up Recommendations  SNF    Does the patient have the potential to tolerate intense rehabilitation      Barriers to Discharge        Equipment Recommendations  Rolling walker with 5" wheels    Recommendations for Other Services     Frequency 7X/week    Precautions / Restrictions Precautions Precautions: Posterior Hip;Fall Restrictions Weight Bearing Restrictions: Yes LLE Weight Bearing: Weight bearing as tolerated   Pertinent Vitals/Pain Pain limiting session today.      Mobility  Bed Mobility Bed Mobility: Supine to Sit;Sit to Supine;Sitting - Scoot to Edge of Bed Supine to Sit: 1: +2 Total assist;HOB elevated Supine to Sit: Patient Percentage: 10% Sitting - Scoot to Edge of Bed: 1: +1 Total assist Sit to Supine: 1: +2 Total assist;HOB elevated Sit to Supine: Patient Percentage: 10% Details for Bed Mobility Assistance: Verbal cues for technique.  Patient able to assist only minimally.  Used bed pads to assist patient to sitting.  Patient sat EOB with min guard assist x 60 seconds and became dizzy.  Returned to supine. Transfers Transfers: Not assessed           PT Diagnosis: Difficulty walking;Generalized weakness;Acute pain  PT Problem List: Decreased strength;Decreased range of motion;Decreased activity tolerance;Decreased  balance;Decreased mobility;Decreased knowledge of use of DME;Decreased knowledge of precautions;Pain PT Treatment Interventions: DME instruction;Gait training;Functional mobility training;Therapeutic exercise;Patient/family education   PT Goals Acute Rehab PT Goals PT Goal Formulation: With patient Time For Goal Achievement: 10/06/12 Potential to Achieve Goals: Good Pt will go Supine/Side to Sit: with min assist;with HOB 0 degrees PT Goal: Supine/Side to Sit - Progress: Goal set today Pt will go Sit to Supine/Side: with min assist;with HOB 0 degrees PT Goal: Sit to Supine/Side - Progress: Goal set today Pt will go Sit to Stand: with min assist;with upper extremity assist PT Goal: Sit to Stand - Progress: Goal set today Pt will Transfer Bed to Chair/Chair to Bed: with min assist PT Transfer Goal: Bed to Chair/Chair to Bed - Progress: Goal set today Pt will Ambulate: 16 - 50 feet;with min assist;with rolling walker PT Goal: Ambulate - Progress: Goal set today  Visit Information  Last PT Received On: 09/22/12 Assistance Needed: +2    Subjective Data  Subjective: "I felt flush and passed out when I fell" Patient Stated Goal: To be able to return home   Prior Functioning  Home Living Lives With: Spouse Available Help at Discharge: Family;Available 24 hours/day Type of Home: Apartment ([place]) Home Access: Level entry Home Layout: One level Home Adaptive Equipment: Shower chair with back Prior Function Level of Independence: Independent Able to Take Stairs?: Yes Driving: Yes Vocation: Retired Musician: Expressive difficulties (Prior CVA and facial injuries - difficult to understand)    Cognition  Overall Cognitive Status: Appears within functional limits for tasks assessed/performed Arousal/Alertness: Lethargic Orientation Level: Appears intact for tasks assessed  Behavior During Session: Lethargic    Extremity/Trunk Assessment Right Upper Extremity  Assessment RUE ROM/Strength/Tone: Deficits RUE ROM/Strength/Tone Deficits: General weakness 4-/5 RUE Sensation: WFL - Light Touch Left Upper Extremity Assessment LUE ROM/Strength/Tone: Deficits LUE ROM/Strength/Tone Deficits: General weakness 4-/5.  Bruising ring finger LUE Sensation: WFL - Light Touch Right Lower Extremity Assessment RLE ROM/Strength/Tone: Deficits RLE ROM/Strength/Tone Deficits: General weakness 4-/5; reduced hip ROM due to pain in Lt hip RLE Sensation: WFL - Light Touch Left Lower Extremity Assessment LLE ROM/Strength/Tone: Deficits;Unable to fully assess;Due to pain;Due to precautions LLE ROM/Strength/Tone Deficits: Decreased strength and ROM following fracture and surgery LLE Sensation: WFL - Light Touch Trunk Assessment Trunk Assessment: Other exceptions Trunk Exceptions: Patient reports neck as sore at times.  Keeps head turned to right.  Is able to turn head to midline   Balance Balance Balance Assessed: Yes Static Sitting Balance Static Sitting - Balance Support: Bilateral upper extremity supported;Feet supported Static Sitting - Level of Assistance: 5: Stand by assistance Static Sitting - Comment/# of Minutes: Once in sitting position, patient able to maintain balance with bil. UE support.  Patient sat approximately 1 minute and became dizzy.  Returned to supine.  End of Session PT - End of Session Equipment Utilized During Treatment: Oxygen Activity Tolerance: Patient limited by pain;Patient limited by fatigue Patient left: in bed;with call bell/phone within reach Nurse Communication: Mobility status (Hip precautions)  GP     Vena Austria 09/22/2012, 1:01 PM Durenda Hurt. Renaldo Fiddler, [facility] Acute Rehab Services Pager [phone]

## 2012-09-23 LAB — CBC
Hemoglobin: 8.4 g/dL — ABNORMAL LOW (ref 12.0–15.0)
MCH: 31 pg (ref 26.0–34.0)
Platelets: 125 10*3/uL — ABNORMAL LOW (ref 150–400)
RBC: 2.71 MIL/uL — ABNORMAL LOW (ref 3.87–5.11)
WBC: 9.8 10*3/uL (ref 4.0–10.5)

## 2012-09-23 LAB — PROTIME-INR
INR: 2.68 — ABNORMAL HIGH (ref 0.00–1.49)
Prothrombin Time: 27.2 seconds — ABNORMAL HIGH (ref 11.6–15.2)

## 2012-09-23 LAB — BASIC METABOLIC PANEL
Chloride: 107 mEq/L (ref 96–112)
GFR calc Af Amer: >90 mL/min (ref >90)
GFR calc non Af Amer: 80 mL/min — ABNORMAL LOW (ref >90)
Potassium: 3.5 mEq/L (ref 3.5–5.1)
Sodium: 139 mEq/L (ref 135–145)

## 2012-09-23 LAB — HEPARIN LEVEL (UNFRACTIONATED): Heparin Unfractionated: 0.24 IU/mL — ABNORMAL LOW (ref 0.30–0.70)

## 2012-09-23 NOTE — Anesthesia Postprocedure Evaluation (Signed)
  Anesthesia Post-op Note  Patient: [name]  Procedure(s) Performed: Procedure(s) (LRB) with comments: ARTHROPLASTY BIPOLAR HIP (Left)  Patient Location: Nursing Unit  Anesthesia Type:General  Level of Consciousness: awake  Airway and Oxygen Therapy: Patient Spontanous Breathing  Post-op Pain: mild  Post-op Assessment: Post-op Vital signs reviewed and PATIENT'S CARDIOVASCULAR STATUS UNSTABLE  Post-op Vital Signs: Reviewed and stable  Complications: No apparent anesthesia complications, patient difficult to arouse POD #1 per RN

## 2012-09-23 NOTE — Progress Notes (Signed)
Physical Therapy Treatment Patient Details Name: [name] MRN: [id number] DOB: October 22, 1930 Today's Date: 09/23/2012 Time: 7829-5621 PT Time Calculation (min): 30 min  PT Assessment / Plan / Recommendation Comments on Treatment Session  Patient required max encouragement to participate.  Required increased assist for mobility today.  Was able to transfer to chair with +2 assist.  Slow progress anticipated.    Follow Up Recommendations  SNF     Does the patient have the potential to tolerate intense rehabilitation     Barriers to Discharge        Equipment Recommendations  Rolling walker with 5" wheels    Recommendations for Other Services    Frequency 7X/week   Plan Discharge plan remains appropriate;Frequency remains appropriate    Precautions / Restrictions Precautions Precautions: Posterior Hip;Fall Precaution Booklet Issued: Yes (comment) Precaution Comments: Reviewed posterior hip precautions and posted in room. Restrictions Weight Bearing Restrictions: Yes LLE Weight Bearing: Weight bearing as tolerated   Pertinent Vitals/Pain Pain limiting factor with mobility    Mobility  Bed Mobility Bed Mobility: Supine to Sit;Sitting - Scoot to Edge of Bed Supine to Sit: 1: +2 Total assist;HOB elevated Supine to Sit: Patient Percentage: 20% Sitting - Scoot to Edge of Bed: 1: +2 Total assist Sitting - Scoot to Edge of Bed: Patient Percentage: 10% Details for Bed Mobility Assistance: Verbal and tactile cues for technique.  Encouraged patient to assist with mobility as much as possible - minimal ability to assist. Transfers Transfers: Stand Pivot Transfers Stand Pivot Transfers: 1: +2 Total assist Stand Pivot Transfers: Patient Percentage: 10% Details for Transfer Assistance: Verbal cues for sequencing/technique.  Educated patient on WBAT status LLE.  Patient able to bear some weight on RLE.  Cues to hold head upright. Ambulation/Gait Ambulation/Gait Assistance: Not tested  (comment)    Exercises Total Joint Exercises Ankle Circles/Pumps: AROM;Both;10 reps;Seated     PT Goals Acute Rehab PT Goals PT Goal: Supine/Side to Sit - Progress: Progressing toward goal PT Goal: Sit to Stand - Progress: Progressing toward goal PT Transfer Goal: Bed to Chair/Chair to Bed - Progress: Progressing toward goal  Visit Information  Last PT Received On: 09/23/12 Assistance Needed: +2    Subjective Data  Subjective: "I feel bad.  I need to lay down"   Cognition  Overall Cognitive Status: Appears within functional limits for tasks assessed/performed Arousal/Alertness: Lethargic Orientation Level: Appears intact for tasks assessed Behavior During Session: Lethargic    Balance  Balance Balance Assessed: Yes Static Sitting Balance Static Sitting - Balance Support: Right upper extremity supported;Feet supported Static Sitting - Level of Assistance: 3: Mod assist Static Sitting - Comment/# of Minutes: Patient with posterior and right lean.  Verbal and tactile cues to come to midline.  Continues to hold head rotated to right.  Verbal cues and physical assist to bring head to midline.  Patient sat EOB x 5 minutes.  Requested to lay down - encouraged patient to try transfers - she agreed to try.  Required increased assist with sitting today.  End of Session PT - End of Session Equipment Utilized During Treatment: Oxygen Activity Tolerance: Patient limited by pain;Patient limited by fatigue Patient left: in chair;with call bell/phone within reach;with family/visitor present Nurse Communication: Mobility status   GP     Vena Austria 09/23/2012, 7:20 PM Durenda Hurt. Renaldo Fiddler, [facility] Acute Rehab Services Pager [phone]

## 2012-09-23 NOTE — Progress Notes (Signed)
ANTICOAGULATION CONSULT NOTE - follow up  Pharmacy Consult for coumadin and heparin Indication: hx of atrial fib and CVA  No Known Allergies  Patient Measurements: Weight: 114 lb 6.7 oz (51.9 kg) Heparin Dosing Weight: 51.9 kg  Vital Signs: Temp: 98.2 F (36.8 C) (01/05 0436) Temp src: Oral (01/05 0436) BP: 97/58 mmHg (01/05 0436) Pulse Rate: 86  (01/05 0436)  Labs:  Basename 09/23/12 0725 09/22/12 1009 09/22/12 0255 09/21/12 0834 09/20/12 2322 09/20/12 1811  HGB 8.4* -- 11.0* -- -- --  HCT 24.2* -- 32.3* 38.1 -- --  PLT 125* -- 127* 121* -- --  APTT -- -- -- -- -- --  LABPROT 27.2* -- 15.9* 18.0* -- --  INR 2.68* -- 1.30 1.54* -- --  HEPARINUNFRC 0.24* 0.29* 0.28* -- -- --  CREATININE 0.70 -- 0.60 0.61 -- --  CKTOTAL -- -- -- -- -- --  CKMB -- -- -- -- -- --  TROPONINI -- -- -- <0.30 <0.30 <0.30    The CrCl is unknown because both a height and weight (above a minimum accepted value) are required for this calculation.   Assessment: 79 yo female with hx of atrial fib and CVA in September of  2012 w/ recurrent CVA 10/2011 w/ subtx INR resumed on coumadin while bridged with heparin after L hip hemiarthroplasty for L hip fx.  Was on coumadin 7.5mg  po qday prior to admission with an admitting INR of 2.24.  Last coumadin dose was 09/19/12.  Coumadin was reversed for surgery with vitamin K 2 mg IV 1/2 at 2330.   INR today has jumped to 2.68.  This is a huge 11.3 second jump in the protime.  Afib w/ rate in the 90s.  Pt with large hematoma and laceration over left eye.  H/H down to 8.4/24.2 from 11/32.3 - big drop.  PLTC 125 - remains down from 164 on admission.  S/p repair of hip fx on 09/21/12.   Heparin level on 700 units/hr = 0.24 which is slightly below  our desired goal.    Goal of Therapy:  Heparin level 0.3-0.5 units/ml Monitor platelets by anticoagulation protocol: Yes INR 2-3   Plan:  1) stop heparin drip as INR is therapeutic today 2) no coumadin today 2nd large jump  in INR 3) daily  INR, CBC Herby Abraham, Pharm.D. [phone] 09/23/2012 10:30 AM

## 2012-09-23 NOTE — Progress Notes (Signed)
SLP Cancellation Note  Patient Details Name: JEYDA SIEBEL MRN: [id number] DOB: 25-Oct-1930   Cancelled treatment:  ST to complete BSE on 09/24/12.  RN made aware.  Moreen Fowler MS, CCC-SLP [phone] [facility] 09/23/2012, 4:52 PM

## 2012-09-23 NOTE — Progress Notes (Signed)
Subjective: 2 Days Post-Op Procedure(s) (LRB): ARTHROPLASTY BIPOLAR HIP (Left) Patient reports pain as moderate.    Objective: Vital signs in last 24 hours: Temp:  [98.2 F (36.8 C)-100.9 F (38.3 C)] 98.2 F (36.8 C) (01/05 0436) Pulse Rate:  [86-114] 86  (01/05 0436) Resp:  [16-20] 18  (01/05 0436) BP: (97-118)/(58-73) 97/58 mmHg (01/05 0436) SpO2:  [92 %-100 %] 92 % (01/05 0436)  Intake/Output from previous day: 01/04 0701 - 01/05 0700 In: 240 [P.O.:240] Out: 2100 [Urine:2100] Intake/Output this shift:     Basename 09/23/12 0725 09/22/12 0255 09/21/12 0834 09/20/12 1403  HGB 8.4* 11.0* 12.8 13.6    Basename 09/23/12 0725 09/22/12 0255  WBC 9.8 9.0  RBC 2.71* 3.60*  HCT 24.2* 32.3*  PLT 125* 127*    Basename 09/23/12 0725 09/22/12 0255  NA 139 138  K 3.5 3.0*  CL 107 105  CO2 28 26  BUN 17 9  CREATININE 0.70 0.60  GLUCOSE 107* 106*  CALCIUM 8.6 8.6    Basename 09/23/12 0725 09/22/12 0255  LABPT -- --  INR 2.68* 1.30    Neurologically intact  Assessment/Plan: 2 Days Post-Op Procedure(s) (LRB): ARTHROPLASTY BIPOLAR HIP (Left) Up with therapy slow progress , INR is theraputic.     Bruising and drop in HGB from blood thinners.     Enslee Bibbins C 09/23/2012, 1:32 PM

## 2012-09-23 NOTE — Progress Notes (Signed)
Patient ID: [name]  female  ZOX:[id number]    DOB: 12-Sep-1931    DOA: 09/20/2012  PCP: [name], MD  Assessment/Plan: Principal Problem:   *Hip fracture - ortho following, POD #2  Active Problems:  Atrial fibrillation - Rate controlled on Cardizem, continue Coumadin - INR therapeutic, 2.68, heparin drip off  Syncope - cardiology folowing, cont tele, out-patient work-up    History of CVA (cerebrovascular accident) - resumed warfarin and heparin gtt after surgery   Fall: PT evaluation recommending skilled nursing facility   Hypothyroidism; cont synthroid   HTN (hypertension): stable  Seizures: cont keppra  Hypokalemia: replaced  DVT Prophylaxis: started on coumadin and heparin  Code Status: FC  Disposition: will likely need SNF, placed social work consult   Subjective: - pain controlled, more alert today, wants solids   Objective: Weight change:   Intake/Output Summary (Last 24 hours) at 09/23/12 1158 Last data filed at 09/23/12 0300  Gross per 24 hour  Intake    120 ml  Output   1300 ml  Net  -1180 ml   Blood pressure 97/58, pulse 86, temperature 98.2 F (36.8 C), temperature source Oral, resp. rate 18, weight 51.9 kg (114 lb 6.7 oz), SpO2 92.00%.  Physical Exam: General: Alert and awake, frail HEENT: marked right and left periorbital bruising and ecchymosis, suturing on forehead CVS: ireg ireg Chest: clear to auscultation bilaterally, no wheezing, rales or rhonchi Abdomen: soft NT, ND, NBS Extremities: no edema   Lab Results: Basic Metabolic Panel:  Lab 09/23/12 8119 09/22/12 0255  NA 139 138  K 3.5 3.0*  CL 107 105  CO2 28 26  GLUCOSE 107* 106*  BUN 17 9  CREATININE 0.70 0.60  CALCIUM 8.6 8.6  MG -- --  PHOS -- --   CBC:  Lab 09/23/12 0725 09/22/12 0255 09/20/12 0943  WBC 9.8 9.0 --  NEUTROABS -- -- 5.4  HGB 8.4* 11.0* --  HCT 24.2* 32.3* --  MCV 89.3 89.7 --  PLT 125* 127* --   Cardiac Enzymes:  Lab 09/21/12  0834 09/20/12 2322 09/20/12 1811  CKTOTAL -- -- --  CKMB -- -- --  CKMBINDEX -- -- --  TROPONINI <0.30 <0.30 <0.30   BNP: No components found with this basename: POCBNP:2 CBG: No results found for this basename: GLUCAP:5 in the last 168 hours   Micro Results: No results found for this or any previous visit (from the past 240 hour(s)).  Studies/Results: Dg Elbow 2 Views Left  09/20/2012  *RADIOLOGY REPORT*  Clinical Data: Fall  LEFT ELBOW - 2 VIEW  Comparison: None.  Findings: No acute fracture.  No dislocation.  No obvious joint effusion.  Osteopenia.  IMPRESSION: No acute bony pathology.   Original Report Authenticated By: Jolaine Click, M.D.    Dg Wrist 2 Views Left  09/20/2012  *RADIOLOGY REPORT*  Clinical Data: Left arm pain with abrasions at left elbow  LEFT WRIST - 2 VIEW  Comparison: Concurrently obtained radiographs of the shoulder and elbow  Findings: The bones are diffusely osteopenic which can limit evaluation for nondisplaced fracture.  AP and lateral views of the wrist demonstrate no acute fracture, or malalignment.  The carpus appears congruent.  Mild soft tissue swelling about the wrist.  IMPRESSION:  1. Mild soft tissue swelling about the wrist without evidence of acute fracture or malalignment.  2.  Advanced osteopenia which can limits the sensitivity for detection of occult fracture.  If clinical concern persists for fracture consider repeat imaging  in 10 - 14 days as occult fracture may become more evident secondary to bony resorption and callus formation.   Original Report Authenticated By: Malachy Moan, M.D.    Dg Hip Complete Left  09/20/2012  *RADIOLOGY REPORT*  Clinical Data: Left hip pain  LEFT HIP - COMPLETE 2+ VIEW  Comparison: None.  Findings: No acute displaced transcervical fracture through the mid left femoral neck.  The distal fracture fragment is displaced superiorly with respect to the femoral head by one half shaft width.  The remainder of the bony pelvis  appears intact.  The femoral head is located on cross-table lateral view.  Unremarkable bowel gas pattern.  IMPRESSION:  Acute transcervical fracture of the left femoral neck.  The femur is displaced superiorly by one half shaft width with respect to the femoral head.   Original Report Authenticated By: Malachy Moan, M.D.    Ct Head Wo Contrast  09/20/2012  *RADIOLOGY REPORT*  Clinical Data:  Syncopal episode resulting in a fall, hitting her head with subsequent headache.  CT HEAD WITHOUT CONTRAST CT MAXILLOFACIAL WITHOUT CONTRAST CT CERVICAL SPINE WITHOUT CONTRAST  Technique:  Multidetector CT imaging of the head, cervical spine, and maxillofacial structures were performed using the standard protocol without intravenous contrast. Multiplanar CT image reconstructions of the cervical spine and maxillofacial structures were also generated.  Comparison:  Previous examinations.  CT HEAD  Findings: No significant change in an old left middle cerebral artery distribution infarct with associated midline shift to the right.  An old right caudate body lacunar infarct is again demonstrated.  There is a left facial laceration and associated soft tissue blood and air.  No skull fracture, intracranial hemorrhage or paranasal sinus air-fluid levels are seen.  There is minimal left maxillary sinus mucosal thickening.  IMPRESSION:  1.  No skull fracture or intracranial hemorrhage. 2.  Stable atrophy and old infarcts. 3.  Left facial laceration.  CT MAXILLOFACIAL  Findings:  Left facial soft tissue laceration with associated blood and air in the subcutaneous fat.  No fractures or paranasal sinus air-fluid levels.  IMPRESSION: Left facial soft tissue laceration without fracture.  CT CERVICAL SPINE  Findings:   Multilevel degenerative changes are stable.  No prevertebral soft tissue swelling, fractures or subluxations. Stable right apical pleural thickening and surgical staples.  IMPRESSION:  1.  No fracture or subluxation. 2.   Stable degenerative changes.   Original Report Authenticated By: Beckie Salts, M.D.    Ct Cervical Spine Wo Contrast  09/20/2012  *RADIOLOGY REPORT*  Clinical Data:  Syncopal episode resulting in a fall, hitting her head with subsequent headache.  CT HEAD WITHOUT CONTRAST CT MAXILLOFACIAL WITHOUT CONTRAST CT CERVICAL SPINE WITHOUT CONTRAST  Technique:  Multidetector CT imaging of the head, cervical spine, and maxillofacial structures were performed using the standard protocol without intravenous contrast. Multiplanar CT image reconstructions of the cervical spine and maxillofacial structures were also generated.  Comparison:  Previous examinations.  CT HEAD  Findings: No significant change in an old left middle cerebral artery distribution infarct with associated midline shift to the right.  An old right caudate body lacunar infarct is again demonstrated.  There is a left facial laceration and associated soft tissue blood and air.  No skull fracture, intracranial hemorrhage or paranasal sinus air-fluid levels are seen.  There is minimal left maxillary sinus mucosal thickening.  IMPRESSION:  1.  No skull fracture or intracranial hemorrhage. 2.  Stable atrophy and old infarcts. 3.  Left facial laceration.  CT  MAXILLOFACIAL  Findings:  Left facial soft tissue laceration with associated blood and air in the subcutaneous fat.  No fractures or paranasal sinus air-fluid levels.  IMPRESSION: Left facial soft tissue laceration without fracture.  CT CERVICAL SPINE  Findings:   Multilevel degenerative changes are stable.  No prevertebral soft tissue swelling, fractures or subluxations. Stable right apical pleural thickening and surgical staples.  IMPRESSION:  1.  No fracture or subluxation. 2.  Stable degenerative changes.   Original Report Authenticated By: Beckie Salts, M.D.    Dg Chest Port 1 View  09/20/2012  *RADIOLOGY REPORT*  Clinical Data: Syncope resulting in a fall.  PORTABLE CHEST - 1 VIEW  Comparison: 11/15/2011.   Findings: Stable enlarged cardiac silhouette and hyperexpanded lungs.  The lungs remain clear.  Right apical surgical staples and pleural thickening are unchanged.  Mild left apical pleural thickening is again demonstrated.  Mild scoliosis.  IMPRESSION: No acute abnormality.  Stable cardiomegaly and changes of COPD.   Original Report Authenticated By: Beckie Salts, M.D.    Dg Shoulder Left  09/20/2012  *RADIOLOGY REPORT*  Clinical Data: History of fall complaining of left shoulder pain.  LEFT SHOULDER - 2+ VIEW  Comparison: No priors.  Findings: Three views of the left shoulder demonstrate no acute displaced fracture, subluxation, dislocation, joint or soft tissue abnormality.  IMPRESSION: 1.  No acute radiographic abnormality of the left shoulder.   Original Report Authenticated By: Trudie Reed, M.D.    Ct Maxillofacial Wo Cm  09/20/2012  *RADIOLOGY REPORT*  Clinical Data:  Syncopal episode resulting in a fall, hitting her head with subsequent headache.  CT HEAD WITHOUT CONTRAST CT MAXILLOFACIAL WITHOUT CONTRAST CT CERVICAL SPINE WITHOUT CONTRAST  Technique:  Multidetector CT imaging of the head, cervical spine, and maxillofacial structures were performed using the standard protocol without intravenous contrast. Multiplanar CT image reconstructions of the cervical spine and maxillofacial structures were also generated.  Comparison:  Previous examinations.  CT HEAD  Findings: No significant change in an old left middle cerebral artery distribution infarct with associated midline shift to the right.  An old right caudate body lacunar infarct is again demonstrated.  There is a left facial laceration and associated soft tissue blood and air.  No skull fracture, intracranial hemorrhage or paranasal sinus air-fluid levels are seen.  There is minimal left maxillary sinus mucosal thickening.  IMPRESSION:  1.  No skull fracture or intracranial hemorrhage. 2.  Stable atrophy and old infarcts. 3.  Left facial  laceration.  CT MAXILLOFACIAL  Findings:  Left facial soft tissue laceration with associated blood and air in the subcutaneous fat.  No fractures or paranasal sinus air-fluid levels.  IMPRESSION: Left facial soft tissue laceration without fracture.  CT CERVICAL SPINE  Findings:   Multilevel degenerative changes are stable.  No prevertebral soft tissue swelling, fractures or subluxations. Stable right apical pleural thickening and surgical staples.  IMPRESSION:  1.  No fracture or subluxation. 2.  Stable degenerative changes.   Original Report Authenticated By: Beckie Salts, M.D.     Medications: Scheduled Meds:    . atorvastatin  10 mg Oral q1800  . diltiazem  240 mg Oral Daily  . docusate sodium  100 mg Oral BID  . feeding supplement  237 mL Oral BID BM  . levETIRAcetam  500 mg Oral BID  . levothyroxine  50 mcg Oral QAC breakfast  . mirtazapine  15 mg Oral QHS  . multivitamin with minerals  1 tablet Oral Q1200  . polycarbophil  625 mg Oral Q1200  . polyethylene glycol  17 g Oral Daily  . sodium chloride  3 mL Intravenous Q12H  . Warfarin - Pharmacist Dosing Inpatient   Does not apply q1800      LOS: 3 days   Madysyn Hanken M.D. [facility] 09/23/2012, 11:58 AM Pager: [phone]  If 7PM-7AM, please contact night-coverage [email or web address] Password [email or web address]

## 2012-09-23 NOTE — Progress Notes (Signed)
Patient Name: [name] Date of Encounter: 09/23/2012   Principal Problem:  *Hip fracture Active Problems:  Atrial fibrillation  CVA (cerebral infarction)  History of CVA (cerebrovascular accident)  Fall  Hypothyroidism  HTN (hypertension)  Seizure  Syncope   SUBJECTIVE  Groggy, no chest pain or sob.  Remains in afib, rates in the 90's.  CURRENT MEDS    . atorvastatin  10 mg Oral q1800  . diltiazem  240 mg Oral Daily  . docusate sodium  100 mg Oral BID  . feeding supplement  237 mL Oral BID BM  . levETIRAcetam  500 mg Oral BID  . levothyroxine  50 mcg Oral QAC breakfast  . mirtazapine  15 mg Oral QHS  . multivitamin with minerals  1 tablet Oral Q1200  . polycarbophil  625 mg Oral Q1200  . polyethylene glycol  17 g Oral Daily  . sodium chloride  3 mL Intravenous Q12H  . Warfarin - Pharmacist Dosing Inpatient   Does not apply q1800    OBJECTIVE  Filed Vitals:   09/22/12 2000 09/22/12 2020 09/23/12 0000 09/23/12 0436  BP:  103/63  97/58  Pulse:  114  86  Temp:  100.9 F (38.3 C)  98.2 F (36.8 C)  TempSrc:  Oral  Oral  Resp: 20 18 16 18   Weight:      SpO2: 97% 92% 100% 92%    Intake/Output Summary (Last 24 hours) at 09/23/12 0742 Last data filed at 09/23/12 0300  Gross per 24 hour  Intake    240 ml  Output   1300 ml  Net  -1060 ml   Filed Weights   09/20/12 2345  Weight: 114 lb 6.7 oz (51.9 kg)   PHYSICAL EXAM  General: Pleasant, NAD. Neuro: Alert and oriented X 3. Groggy this AM. Moves all extremities spontaneously. Psych: Flat affect. HEENT:  Notable for significant bruising and swelling of face/orbits.  Neck: Supple without bruits or JVD. Lungs:  Resp regular and unlabored, CTA. Heart: IR, IR, no s3, s4, or murmurs. Abdomen: Soft, non-tender, non-distended, BS + x 4.  Extremities: No clubbing, cyanosis or edema. DP/PT/Radials 2+ and equal bilaterally.  Left hip incision dsg d/i.  Accessory Clinical Findings  CBC  Basename 09/22/12  0255 09/21/12 0834 09/20/12 0943  WBC 9.0 9.3 --  NEUTROABS -- -- 5.4  HGB 11.0* 12.8 --  HCT 32.3* 38.1 --  MCV 89.7 90.3 --  PLT 127* 121* --   Basic Metabolic Panel  Basename 09/22/12 0255 09/21/12 0834  NA 138 140  K 3.0* 3.7  CL 105 106  CO2 26 22  GLUCOSE 106* 88  BUN 9 12  CREATININE 0.60 0.61  CALCIUM 8.6 9.1  MG -- --  PHOS -- --   Cardiac Enzymes  Basename 09/21/12 0834 09/20/12 2322 09/20/12 1811  CKTOTAL -- -- --  CKMB -- -- --  CKMBINDEX -- -- --  TROPONINI <0.30 <0.30 <0.30   Thyroid Function Tests  Basename 09/20/12 1811  TSH 0.683  T4TOTAL --  T3FREE --  THYROIDAB --   Lab Results  Component Value Date   INR 1.30 09/22/2012   INR 1.54* 09/21/2012   INR 2.24* 09/20/2012    TELE  Coarse Afib, 90's.  Assessment & Plan  1.  Syncope:  Remains in afib.  No significant tachy/brady arrhythmias noted on tele.  Will need outpt event monitor @ d/c.  2.  Left Hip Fx:  S/p hemiarthroplasty.  Per ortho.  3.  Afib:  Reasonably rate controlled.  Cont current dose of dilt.  INR 1.3 yesterday, pending this AM.  Cont coumadin with heparin bridge until therapeutic (h/o CVA).  4.  HTN:  Stable.  Soft @ times.  Cont current dose of dilt.  5.  Hypokalemia:  K+ 3.0 yesterday - was supplemented.  F/U this AM pending.  Signed, Nicolasa Ducking NP As above, patient seen and examined. She denies dyspnea or chest pain. Plan to continue Cardizem. Continue heparin until INR therapeutic given history of CVA with her atrial fibrillation. Follow periorbital exam. She has a hematoma on the left but does not appear to be changed in size. [name] 8:35 AM

## 2012-09-24 ENCOUNTER — Inpatient Hospital Stay (HOSPITAL_COMMUNITY): Payer: Medicare Other

## 2012-09-24 ENCOUNTER — Encounter (HOSPITAL_COMMUNITY): Payer: Self-pay | Admitting: Specialist

## 2012-09-24 DIAGNOSIS — I619 Nontraumatic intracerebral hemorrhage, unspecified: Secondary | ICD-10-CM

## 2012-09-24 DIAGNOSIS — I615 Nontraumatic intracerebral hemorrhage, intraventricular: Secondary | ICD-10-CM

## 2012-09-24 DIAGNOSIS — D649 Anemia, unspecified: Secondary | ICD-10-CM | POA: Diagnosis present

## 2012-09-24 LAB — HEMOGLOBIN AND HEMATOCRIT, BLOOD
HCT: 25.8 % — ABNORMAL LOW (ref 36.0–46.0)
Hemoglobin: 8.9 g/dL — ABNORMAL LOW (ref 12.0–15.0)

## 2012-09-24 LAB — PREPARE RBC (CROSSMATCH)

## 2012-09-24 LAB — BASIC METABOLIC PANEL
BUN: 20 mg/dL (ref 6–23)
Chloride: 103 mEq/L (ref 96–112)
GFR calc Af Amer: 90 mL/min — ABNORMAL LOW (ref >90)
GFR calc non Af Amer: 77 mL/min — ABNORMAL LOW (ref >90)
Potassium: 3.4 mEq/L — ABNORMAL LOW (ref 3.5–5.1)
Sodium: 136 mEq/L (ref 135–145)

## 2012-09-24 LAB — CBC
Hemoglobin: 7.2 g/dL — ABNORMAL LOW (ref 12.0–15.0)
MCHC: 33.5 g/dL (ref 30.0–36.0)
RDW: 13.4 % (ref 11.5–15.5)
WBC: 8.8 10*3/uL (ref 4.0–10.5)

## 2012-09-24 LAB — GLUCOSE, CAPILLARY: Glucose-Capillary: 123 mg/dL — ABNORMAL HIGH (ref 70–99)

## 2012-09-24 LAB — PROTIME-INR
INR: 2.9 — ABNORMAL HIGH (ref 0.00–1.49)
Prothrombin Time: 28.8 seconds — ABNORMAL HIGH (ref 11.6–15.2)

## 2012-09-24 MED ORDER — WARFARIN SODIUM 5 MG PO TABS
5.0000 mg | ORAL_TABLET | Freq: Once | ORAL | Status: DC
  Filled 2012-09-24: qty 1

## 2012-09-24 MED ORDER — DIGOXIN 0.25 MG/ML IJ SOLN
0.2500 mg | Freq: Once | INTRAMUSCULAR | Status: AC
Start: 2012-09-24 — End: 2012-09-25
  Administered 2012-09-25: 0.25 mg via INTRAVENOUS
  Filled 2012-09-24 (×2): qty 1

## 2012-09-24 MED ORDER — DIGOXIN 0.25 MG/ML IJ SOLN
0.5000 mg | Freq: Once | INTRAMUSCULAR | Status: AC
  Administered 2012-09-24: 0.5 mg via INTRAVENOUS
  Filled 2012-09-24: qty 2

## 2012-09-24 MED ORDER — VITAMIN K1 10 MG/ML IJ SOLN
5.0000 mg | Freq: Once | INTRAVENOUS | Status: AC
  Administered 2012-09-24: 5 mg via INTRAVENOUS
  Filled 2012-09-24 (×2): qty 0.5

## 2012-09-24 MED ORDER — DIGOXIN 125 MCG PO TABS
0.1250 mg | ORAL_TABLET | Freq: Every day | ORAL | Status: DC
  Administered 2012-09-25 – 2012-10-01 (×7): 0.125 mg via ORAL
  Filled 2012-09-24 (×7): qty 1

## 2012-09-24 MED ORDER — POTASSIUM CHLORIDE CRYS ER 20 MEQ PO TBCR
40.0000 meq | EXTENDED_RELEASE_TABLET | Freq: Once | ORAL | Status: AC
  Administered 2012-09-24: 40 meq via ORAL
  Filled 2012-09-24: qty 2

## 2012-09-24 NOTE — Progress Notes (Signed)
Code called on 2030. Pt had rec'd Vicodin x 2, Keppra 500 mg and Cardizem CD 240 mg about 2:30 pm. She got up with assistance to [facility] about 3:00 pm. She became dizzy, had decreased LOC and became unresponsive. She never lost pulses but respirations decreased. RN was present. Code called, staff put pt back in bed and assisted respirations (briefly). She never lost pulses and respirations increased after less than 30 seconds of assistance. Dr Vassie Loll was present but no intervention needed.   Spoke with pt, she had no chest pain or SOB. HR increased from 120s to as high as 160 but no rhythm change, still in atrial flutter. Currently, BP 96 systolic with pt supine and awake. Spoke with Dr Isidoro Donning and updated her. She is in to assess pt. Transfer to stepdown ordered. Spoke with Dr Tenny Craw, will add dig and follow HR. Hopefully, once H&H improved her heart rate will also improve. Will continue to follow on stepdown.  Rhonda Barrett  Disccussed case.  Agree with plans for tx and adding Dig.  Will continue to follow. Dietrich Pates 4:27 PM

## 2012-09-24 NOTE — Progress Notes (Signed)
Clinical Social Work  CSW received referral for SNF. CSW went to room but patient out of room for procedure. CSW called and left a message with husband with CSW contact information. CSW will continue to follow.  [place], [place] [phone]

## 2012-09-24 NOTE — Progress Notes (Signed)
Report called to [place], Charity fundraiser. Pt transferred to [id number].

## 2012-09-24 NOTE — Progress Notes (Signed)
Clinical Social Work Department CLINICAL SOCIAL WORK PLACEMENT NOTE 09/24/2012  Patient:  [name], [name]  Account Number:  [id number] Admit date:  09/20/2012  Clinical Social Worker:  Unk Lightning, LCSW  Date/time:  09/24/2012 01:30 PM  Clinical Social Work is seeking post-discharge placement for this patient at the following level of care:   SKILLED NURSING   (*CSW will update this form in Epic as items are completed)   09/24/2012  Patient/family provided with [facility] Department of Clinical Social Work's list of facilities offering this level of care within the geographic area requested by the patient (or if unable, by the patient's family).  09/24/2012  Patient/family informed of their freedom to choose among providers that offer the needed level of care, that participate in Medicare, Medicaid or managed care program needed by the patient, have an available bed and are willing to accept the patient.  09/24/2012  Patient/family informed of [facility]' ownership interest in [facility], as well as of the fact that they are under no obligation to receive care at this facility.  PASARR submitted to EDS on existing # PASARR number received from EDS on   FL2 transmitted to all facilities in geographic area requested by pt/family on  09/24/2012 FL2 transmitted to all facilities within larger geographic area on   Patient informed that his/her managed care company has contracts with or will negotiate with  certain facilities, including the following:     Patient/family informed of bed offers received:  09/24/2012 Patient chooses bed at  Physician recommends and patient chooses bed at    Patient to be transferred to  on   Patient to be transferred to facility by   The following physician request were entered in Epic:   Additional Comments:

## 2012-09-24 NOTE — Progress Notes (Signed)
Subjective: 3 Days Post-Op Procedure(s) (LRB): ARTHROPLASTY BIPOLAR HIP (Left) Awake, alert, tolerating po meds and po diet. Slow with PT 2+ assist in transfers. Patient reports pain as moderate.    Objective:   VITALS:  Temp:  [97.9 F (36.6 C)-98.4 F (36.9 C)] 98.4 F (36.9 C) (01/06 0446) Pulse Rate:  [93-110] 93  (01/06 0446) Resp:  [18-20] 18  (01/06 0446) BP: (89-111)/(44-58) 111/58 mmHg (01/06 1125) SpO2:  [93 %-100 %] 94 % (01/06 0446)  Neurologically intact ABD soft Neurovascular intact Sensation intact distally Intact pulses distally Dorsiflexion/Plantar flexion intact Incision: dressing C/D/I No cellulitis present   LABS  Basename 09/24/12 0455 09/23/12 0725 09/22/12 0255  HGB 7.2* 8.4* 11.0*  WBC 8.8 9.8 --  PLT 121* 125* --    Basename 09/24/12 0455 09/23/12 0725  NA 136 139  K 3.4* 3.5  CL 103 107  CO2 30 28  BUN 20 17  CREATININE 0.78 0.70  GLUCOSE 102* 107*    Basename 09/24/12 0455 09/23/12 0725  LABPT -- --  INR 2.90* 2.68*     Assessment/Plan: 3 Days Post-Op Procedure(s) (LRB): ARTHROPLASTY BIPOLAR HIP (Left) Acute periop blood loss anemia Facial contusions, ecchymosis, left lateral orbit laceration post suture 4 days ago. Anticoagulation for afibrillation with past history of embolic CVA.   Advance diet Up with therapy Discharge to SNF Family and patient prefer CIR will await Rehab attending note. Start the SNF in the meantime. Agree with transfusion for HGb 7.2 with anticoagulation high risk of decompensation if acute bleed And already high risk for fall and orthostatic BP changes.  Aurel Nguyen E 09/24/2012, 12:44 PM

## 2012-09-24 NOTE — Progress Notes (Signed)
Physical Therapy Treatment Patient Details Name: [name] MRN: [id number] DOB: 12/18/1930 Today's Date: 09/24/2012 Time: 3086-5784 PT Time Calculation (min): 40 min  PT Assessment / Plan / Recommendation Comments on Treatment Session  Patient improved with sitting balance today and able to sequence with help and cues to use walker to get to [facility].  Feel limited due to low hemoglobin today with BP 111/58 after returned to supine after back to bed from [facility].    Follow Up Recommendations  SNF                 Equipment Recommendations  Rolling walker with 5" wheels        Frequency Min 4X/week   Plan Discharge plan remains appropriate;Frequency needs to be updated    Precautions / Restrictions Precautions Precautions: Posterior Hip Precaution Booklet Issued: Yes (comment) Precaution Comments: Reviewed posterior hip precautions and posted in room. (pt unable to remember any precuations) Required Braces or Orthoses: Knee Immobilizer - Left Restrictions LLE Weight Bearing: Weight bearing as tolerated Other Position/Activity Restrictions: posterior hip   Pertinent Vitals/Pain 5/10left hip with activity    Mobility  Bed Mobility Bed Mobility: Supine to Sit;Sitting - Scoot to [place] of Bed;Sit to Supine Supine to Sit: 1: +2 Total assist;HOB elevated Supine to Sit: Patient Percentage: 20% Sitting - Scoot to Edge of Bed: 1: +2 Total assist Sitting - Scoot to Edge of Bed: Patient Percentage: 10% Sit to Supine: 1: +2 Total assist;HOB elevated Sit to Supine: Patient Percentage: 10% Details for Bed Mobility Assistance: Patient able to assist with scooting over in bed using overhead trapeze, but needed cues to keep left hand on trapeze bar Transfers Sit to Stand: From bed;With upper extremity assist;1: +2 Total assist Sit to Stand: Patient Percentage: 30% Stand to Sit: To bed;1: +2 Total assist Stand to Sit: Patient Percentage: 10% Stand Pivot Transfers: 1: +2 Total assist Stand  Pivot Transfers: Patient Percentage: 30% Details for Transfer Assistance: bed to [facility] able to stand with RW and move right foot forward with assist and cues for posture, technique.  Due to feeling dizzy on BSC, transferred back to bed via pivot without walker.      PT Goals Acute Rehab PT Goals Pt will go Supine/Side to Sit: with min assist;with HOB 0 degrees PT Goal: Supine/Side to Sit - Progress: Progressing toward goal Pt will go Sit to Supine/Side: with min assist;with HOB 0 degrees PT Goal: Sit to Supine/Side - Progress: Progressing toward goal Pt will go Sit to Stand: with min assist PT Goal: Sit to Stand - Progress: Progressing toward goal Pt will Transfer Bed to Chair/Chair to Bed: with min assist PT Transfer Goal: Bed to Chair/Chair to Bed - Progress: Progressing toward goal  Visit Information  Last PT Received On: 09/24/12 Assistance Needed: +2 PT/OT Co-Evaluation/Treatment: Yes    Subjective Data  Subjective: Feel dizzy, need to lie down   Cognition  Overall Cognitive Status: Impaired Area of Impairment: Memory;Awareness of errors;Awareness of deficits;Problem solving Arousal/Alertness: Awake/alert Orientation Level: Appears intact for tasks assessed Behavior During Session: [facility] for tasks performed Memory: Decreased recall of precautions Awareness of Errors: Assistance required to identify errors made;Assistance required to correct errors made Awareness of Deficits: unsure of previous deficits from CVA Problem Solving: delayed processing. mod A with functional basic for transfer Cognition - Other Comments: will further assess    Balance  Static Sitting Balance Static Sitting - Balance Support: Bilateral upper extremity supported;Feet supported Static Sitting - Level of  Assistance: 4: Min assist;5: Stand by assistance Static Sitting - Comment/# of Minutes: cues to maintain anterior weight shift  End of Session PT - End of Session Equipment Utilized During Treatment:  Gait belt Activity Tolerance: Patient limited by pain;Patient limited by fatigue Patient left: in bed;with call bell/phone within reach Nurse Communication: Mobility status   GP     [facility] 09/24/2012, 2:23 PM  Sheran Lawless, PT [phone] 09/24/2012

## 2012-09-24 NOTE — Code Documentation (Signed)
CODE BLUE NOTE  Patient Name: [name]   MRN: [id number]   Date of Birth/ Sex: 1931/03/15 , female      Admission Date: 09/20/2012  Attending Provider: Cathren Harsh, MD  Primary Diagnosis: Hip fracture    Indication: Pt was in her usual state of health until this PM, when she was noted to be decreased level of consciousness with questionable pulse. Code blue was subsequently called. At the time of arrival on scene, patient had pulse and was being bagged.    Technical Description:  - CPR performance duration:  0 minute  - Was defibrillation or cardioversion used? No   - Was external pacer placed? Yes  - Was patient intubated pre/post CPR? No    Medications Administered: Y = Yes; Blank = No Amiodarone    Atropine    Calcium    Epinephrine    Lidocaine    Magnesium    Norepinephrine    Phenylephrine    Sodium bicarbonate    Vasopressin      Post CPR evaluation:  - Final Status - Was patient successfully resuscitated ? Yes - What is current rhythm? Pulse, tachy - What is current hemodynamic status? stable   Miscellaneous Information:  - Labs sent, including: none  - Primary team notified?  Yes  - Family Notified? Yes  - Additional notes/ transfer status: Called placed and spoke with doctor on arrival to scene   Judie Bonus, MD  09/24/2012, 3:14 PM

## 2012-09-24 NOTE — Progress Notes (Signed)
Clinical Social Work Department BRIEF PSYCHOSOCIAL ASSESSMENT 09/24/2012  Patient:  [name], [name]     Account Number:  [id number]     Admit date:  09/20/2012  Clinical Social Worker:  Dennison Bulla  Date/Time:  09/24/2012 01:30 PM  Referred by:  Physician  Date Referred:  09/24/2012 Referred for  SNF Placement   Other Referral:   Interview type:  Patient Other interview type:    PSYCHOSOCIAL DATA Living Status:  FACILITY Admitted from facility:  [facility] Level of care:  Independent Living Primary support name:  Clide Dales Primary support relationship to patient:  SPOUSE Degree of support available:   Strong    CURRENT CONCERNS Current Concerns  Post-Acute Placement   Other Concerns:    SOCIAL WORK ASSESSMENT / PLAN CSW received referral due to patient needing SNF. CSW reviewed chart and met with patient and husband at bedside.    CSW introduced myself and explained role. Patient and husband live at [facility] and agreeable to rehab at dc. Patient has been at [facility] in the past and [facility]. Patient's first choice is [facility]. Per chart review, CIR consult ordered. CSW explained SNF as alternative option. CSW provided patient with SNF list and explained process. Patient agreeable to fax out to [facility]. CSW explained insurance coverage and copays for SNF.    CSW completed FL2 and faxed out.   Assessment/plan status:  Psychosocial Support/Ongoing Assessment of Needs Other assessment/ plan:   Information/referral to community resources:   SNF list    PATIENT'S/FAMILY'S RESPONSE TO PLAN OF CARE: Patient alert and oriented. Husband engaged and answered most questions. Patient agreeable to rehab at dc and wants to follow up with CSW after CIR has made a decision.

## 2012-09-24 NOTE — Progress Notes (Addendum)
ANTICOAGULATION CONSULT NOTE - Follow Up Consult  Pharmacy Consult for coumadin Indication: hx of atrial fib and CVA  No Known Allergies  Patient Measurements: Height: 5\' 8"  (172.7 cm) Weight: 114 lb 6.7 oz (51.9 kg) IBW/kg (Calculated) : 63.9    Vital Signs: Temp: 98.4 F (36.9 C) (01/06 0446) Temp src: Oral (01/06 0446) BP: 94/54 mmHg (01/06 0446) Pulse Rate: 93  (01/06 0446)  Labs:  Basename 09/24/12 0455 09/23/12 0725 09/22/12 1009 09/22/12 0255  HGB 7.2* 8.4* -- --  HCT 21.5* 24.2* -- 32.3*  PLT 121* 125* -- 127*  APTT -- -- -- --  LABPROT 28.8* 27.2* -- 15.9*  INR 2.90* 2.68* -- 1.30  HEPARINUNFRC -- 0.24* 0.29* 0.28*  CREATININE 0.78 0.70 -- 0.60  CKTOTAL -- -- -- --  CKMB -- -- -- --  TROPONINI -- -- -- --    Estimated Creatinine Clearance: 46.7 ml/min (by C-G formula based on Cr of 0.78).  Assessment: Patient is a 79 y.o F on coumadin for hx Afib and CVA.  INR is therapeutic today at 2.90 (after dose held yesterday d/t sharp jumped in INR from previous day).  No bleeding noted.  Goal of Therapy:  INR 2-3    Plan:  1) coumadin 5mg  PO x1 today  Shrihan Putt P 09/24/2012,9:16 AM   ______________________ Adden (2:37PM): Head MRI with noted "acute/subacute hemorrhage within the choroid of the left lateral ventricle."  Per Dr. Isidoro Donning, hold coumadin for now

## 2012-09-24 NOTE — Progress Notes (Signed)
Occupational Therapy Evaluation Patient Details Name: ANQUANETTE BAHNER MRN: [id number] DOB: 08-09-1931 Today's Date: 09/24/2012 Time: 4098-1191 OT Time Calculation (min): 40 min  OT Assessment / Plan / Recommendation Clinical Impression  81 s/p fall with L hip fracture. Underwent unipolar hip arthorplasty. WBAT. posterior precautions. Pt with hx of CVA but unable to determine residual deficits per pt report. PTA, pt was independent with all ADL and mobility and was driving. Lived in independent living at [place] with husband. Pt currently total A +2 with mobility and total A for ADL. Pt will benefit from SNF for rehab prior to returning home. Discussed apparent neurological deficits ( decreaed attention L side, decreased tracking and visual fixation L field, apparent proprioceptive deficits L UE, decreased fine motor/coordination L UE, L facial droop) with nsg, who called MD. Pt will benefit from acute OT services to max independence with ADL and functional moiblity for ADL to facilitate D/C to next venue of care.    OT Assessment  Patient needs continued OT Services    Follow Up Recommendations  SNF    Barriers to Discharge Decreased caregiver support elderly husband  Equipment Recommendations    TBA   Recommendations for Other Services  none  Frequency  Min 2X/week    Precautions / Restrictions Precautions Precautions: Posterior Hip;Fall Precaution Booklet Issued: Yes (comment) Precaution Comments: Reviewed posterior hip precautions and posted in room. (pt unable to remember any precuations) Required Braces or Orthoses: Knee Immobilizer - Left (when in bed) Restrictions Weight Bearing Restrictions: Yes LLE Weight Bearing: Weight bearing as tolerated Other Position/Activity Restrictions: posterior hip   Pertinent Vitals/Pain 5. nsg aware    ADL  Eating/Feeding: Other (comment) (not assessed) Grooming: Moderate assistance Where Assessed - Grooming: Supported sitting Upper  Body Bathing: Moderate assistance Where Assessed - Upper Body Bathing: Unsupported sitting Lower Body Bathing: +1 Total assistance Where Assessed - Lower Body Bathing: Supported sit to stand Upper Body Dressing: Moderate assistance Where Assessed - Upper Body Dressing: Supported sitting Lower Body Dressing: +1 Total assistance Where Assessed - Lower Body Dressing: Supported sit to Pharmacist, hospital: +2 Total assistance Toilet Transfer: Patient Percentage: 30% Statistician Method: Surveyor, minerals: Materials engineer and Hygiene: +1 Total assistance Where Assessed - Engineer, mining and Hygiene: Sit to stand from 3-in-1 or toilet Tub/Shower Transfer: +2 Total assistance Equipment Used: Rolling walker;Gait belt Transfers/Ambulation Related to ADLs: +2 total A. Pt not bearing wt via LLE. Keeping L knee flexed ADL Comments: lmited by pain, weakness and apparent proprioceptive deficits with L side.    OT Diagnosis: Generalized weakness;Cognitive deficits;Disturbance of vision;Acute pain;Paresis;Apraxia  OT Problem List: Decreased strength;Decreased range of motion;Decreased activity tolerance;Impaired balance (sitting and/or standing);Impaired vision/perception;Decreased coordination;Decreased cognition;Decreased safety awareness;Decreased knowledge of use of DME or AE;Decreased knowledge of precautions;Cardiopulmonary status limiting activity;Impaired sensation;Impaired UE functional use;Pain;Increased edema OT Treatment Interventions: Self-care/ADL training;Therapeutic exercise;Neuromuscular education;Energy conservation;DME and/or AE instruction;Therapeutic activities;Cognitive remediation/compensation;Visual/perceptual remediation/compensation;Patient/family education;Balance training   OT Goals Acute Rehab OT Goals OT Goal Formulation: With patient/family Time For Goal Achievement: 10/08/12 Potential to Achieve  Goals: Good ADL Goals Pt Will Perform Grooming: with min assist;Supported;Standing at sink;with cueing (comment type and amount) ADL Goal: Grooming - Progress: Goal set today Pt Will Perform Upper Body Bathing: with set-up;Sitting at sink;Unsupported ADL Goal: Upper Body Bathing - Progress: Goal set today Pt Will Perform Upper Body Dressing: with set-up;Sitting, bed;Unsupported ADL Goal: Upper Body Dressing - Progress: Goal set today Pt Will Transfer to Toilet: with  mod assist;Ambulation;with DME;3-in-1;Maintaining hip precautions ADL Goal: Toilet Transfer - Progress: Goal set today Pt Will Perform Toileting - Clothing Manipulation: with mod assist;Sitting on 3-in-1 or toilet;Standing ADL Goal: Toileting - Clothing Manipulation - Progress: Goal set today Pt Will Perform Toileting - Hygiene: with supervision;Sit to stand from 3-in-1/toilet;Standing at 3-in-1/toilet ADL Goal: Toileting - Hygiene - Progress: Goal set today Additional ADL Goal #1: Pt will verbalize 3/3 hip precautions with min vc ADL Goal: Additional Goal #1 - Progress: Goal set today  Visit Information  Last OT Received On: 09/24/12 Assistance Needed: +2 PT/OT Co-Evaluation/Treatment: Yes    Subjective Data   I was independent   Prior Functioning     Home Living Lives With: Spouse Available Help at Discharge: Family;Available 24 hours/day Type of Home: Apartment Home Access: Level entry Home Layout: One level Bathroom Shower/Tub: Walk-in shower;Door [employer] Toilet: Handicapped height Bathroom Accessibility: Yes How Accessible: Accessible via wheelchair;Accessible via walker Home Adaptive Equipment: Shower chair with back Prior Function Level of Independence: Independent Able to Take Stairs?: Yes Driving: Yes Vocation: Retired Musician: No difficulties Dominant Hand: Right         Vision/Perception Vision - Assessment Eye Alignment: Within Functional Limits Vision Assessment:  Vision tested Ocular Range of Motion: Within Functional Limits Alignment/Gaze Preference: Gaze right Tracking/Visual Pursuits: Decreased smoothness of horizontal tracking;Decreased smoothness of eye movement to LEFT superior field;Decreased smoothness of eye movement to LEFT inferior field;Requires cues, head turns, or add eye shifts to track;Impaired - to be further tested in functional context Saccades: Additional eye shifts occurred during testing;Decreased speed of saccadic movement;Impaired - to be further tested in functional context Perception Perception: Not tested Praxis Praxis: Impaired Praxis Impairment Details: Motor planning (decreased processing. difficulty with initiation at timesq) Praxis-Other Comments: apparent L inattention   Cognition  Overall Cognitive Status: Impaired Area of Impairment: Memory;Awareness of errors;Awareness of deficits;Problem solving Arousal/Alertness: Awake/alert Orientation Level: Appears intact for tasks assessed Behavior During Session: [facility] for tasks performed Memory: Decreased recall of precautions Awareness of Errors: Assistance required to identify errors made;Assistance required to correct errors made Awareness of Deficits: unsure of previous deficits from CVA Problem Solving: delayed processing. mod A with functional basic for transfer Cognition - Other Comments: will further assess    Extremity/Trunk Assessment Right Upper Extremity Assessment RUE ROM/Strength/Tone: Deficits RUE ROM/Strength/Tone Deficits: General weakness 4-/5 RUE Sensation: WFL - Light Touch RUE Coordination: WFL - gross/fine motor Left Upper Extremity Assessment LUE ROM/Strength/Tone: Deficits LUE ROM/Strength/Tone Deficits: shoulder @ 3+/5. elbow/hand 3/5. normal tone LUE Sensation: Deficits LUE Sensation Deficits: proprioceptive deficits LUE Coordination: Deficits LUE Coordination Deficits: dropping items, not aware items are in hand. poor in hand manipulation  skills Right Lower Extremity Assessment RLE ROM/Strength/Tone: Deficits RLE ROM/Strength/Tone Deficits: General weakness 4-/5; reduced hip ROM due to pain in Lt hip Left Lower Extremity Assessment LLE ROM/Strength/Tone: Deficits;Unable to fully assess;Due to pain;Due to precautions LLE ROM/Strength/Tone Deficits: Decreased strength and ROM following fracture and surgery Trunk Assessment Trunk Assessment: Kyphotic Trunk Exceptions: max cues to maintain upright posture. posterior lean     Mobility Bed Mobility Bed Mobility: Supine to Sit;Sitting - Scoot to Edge of Bed;Sit to Supine Supine to Sit: 1: +2 Total assist;HOB elevated Supine to Sit: Patient Percentage: 20% Sitting - Scoot to Edge of Bed: 1: +2 Total assist Sitting - Scoot to Edge of Bed: Patient Percentage: 10% Sit to Supine: 1: +2 Total assist;HOB elevated Sit to Supine: Patient Percentage: 10% Details for Bed Mobility Assistance: Verbal and tactile cues  for technique.  Encouraged patient to assist with mobility as much as possible - minimal ability to assist. Transfers Transfers: Sit to Stand;Stand to Sit Sit to Stand: From bed;With upper extremity assist;1: +2 Total assist Sit to Stand: Patient Percentage: 30% Stand to Sit: To bed;1: +2 Total assist Stand to Sit: Patient Percentage: 10% Details for Transfer Assistance: vc for sequencing, problem solving. manual facilitation to use L hand. Apparent decreased awareness of L hand. Reported dizziness after sitting on BSC and transferred back to bed std pivot               Balance Static Sitting Balance Static Sitting - Balance Support: Bilateral upper extremity supported;Feet supported Static Sitting - Level of Assistance: 4: Min assist Static Sitting - Comment/# of Minutes: posterior lean. able to correct with vc but unable to amintain   End of Session OT - End of Session Equipment Utilized During Treatment: Gait belt Activity Tolerance: Patient limited by  fatigue;Treatment limited secondary to medical complications (Comment) (reports of dizziness. returned to bed) Patient left: in bed;with call bell/phone within reach;with family/visitor present Nurse Communication: Mobility status;Other (comment) (concerns over neurological deficits)  GO     Bolden Hagerman,HILLARY 09/24/2012, 12:49 PM [facility], OTR/L  [phone] 09/24/2012

## 2012-09-24 NOTE — Evaluation (Signed)
Clinical/Bedside Swallow Evaluation Patient Details  Name: BELLATRIX DEVONSHIRE MRN: [id number] Date of Birth: 04/03/1931  Today's Date: 09/24/2012 Time: 1010-1025 SLP Time Calculation (min): 15 min  Past Medical History:  Past Medical History  Diagnosis Date  . Dysphagia   . Atrial fibrillation     a. Dx 05/2011 at time of CVA.  Marland Kitchen Hyperlipidemia   . HTN (hypertension)   . Hypothyroidism   . Pneumonia     "alot of pneumonia" (09/21/2012)  . Syncope and collapse     "fell @ [place]; hit left forehead"; left hip fx/notes 09/20/2012  . Vertigo   . Arthritis     "some; all over" (09/22/2011)  . Anxiety   . Depression   . Seizures      a. 05/2011 right MCA CVA in setting of new onset afib s/p thrombolysis/thrombolytics. b. Recurrent CVA 10/2011 in setting of subtherap.INR.    Marland Kitchen Seizure 10/2011    "only one she ever had was 3 months after her stroke" (09/21/2012  . Stroke 05/2011    a. 05/2011 right MCA CVA in setting of new onset afib s/p thrombolysis/thrombolytics. b. Recurrent CVA 10/2011 in setting of subtherap.INR.  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 Lumbar disc surgery 1981  . Vaginal hysterectomy 1982  . Pleural scarification 1977?; 1986    "2 lung surgeries; my lungs had collapsed" (09/21/2012)  . Tonsillectomy and adenoidectomy 1950?  Marland Kitchen Appendectomy 1982  . Thrombolysis 05/2011    [name] 09/20/2012   HPI:  Stasha Naraine is a 79 y.o. female, with history of atrial fibrillation and CVA in September of last year, who is on Coumadin, history of seizures, was in usual state of health however this morning while admitting in her house she had a sensation of warmth and flushing following which she passed out and fell hitting her left hip, of and left forehead to the floor, she sustained a laceration around her left forehead, started experiencing pain around her left  elbow and left hip. Pt underwent left hip hemiarthroplasty on 09/22/11 with brief ETT. PMH: large old right CVA, acute right frontal/temporal infarct 2/13, dysphagia with 3 MBS' between 05/24/11-06/08/11. MBS 06/08/11 revealed flash penetration with thin liquids with recommendation of DYs 2 thin liquids, double swallows and intermittent throat clear.    Assessment / Plan / Recommendation Clinical Impression  Pt with similar appearance of function since last assessment with continued intermittent signs of penetration/aspiration (throat clear/cough in approx 1/5 trials). No acute change despite recent fall, though risk of impact of intermittent aspiration events may be increased due to decreased mobility and weakness. Pt continues to show multiple swallows to transit bolus, intermittent cough, throat clear, decreased with cup sips. Reviewed previously recommended aspiration precautions with husband and pt. Will downgrade diet to a dys 3 (mechanical soft) with thin liquids given husbands concern that some meats on regular tray were difficult for pt to masticate (though function appears WNL). Will f/u x1 for tolerance given higher risk without any evidence of pna at this time.     Aspiration Risk  Mild    Diet Recommendation Dysphagia 3 (Mechanical Soft);Thin liquid   Liquid Administration via: Cup;No straw Medication Administration: Whole meds with liquid Supervision: Patient able to self feed;Intermittent supervision to cue for compensatory strategies Compensations: Slow rate;Small sips/bites;Clear throat intermittently;Multiple dry swallows after each bite/sip Postural Changes and/or Swallow  Maneuvers: Seated upright 90 degrees    Other  Recommendations Oral Care Recommendations: Oral care BID   Follow Up Recommendations  None    Frequency and Duration min 2x/week  1 week   Pertinent Vitals/Pain NA    SLP Swallow Goals Patient will utilize recommended strategies during swallow to increase  swallowing safety with: Supervision/safety   Swallow Study Prior Functional Status  Type of Home: Apartment Lives With: Spouse Available Help at Discharge: Family;Available 24 hours/day Vocation: Retired    Radio producer HPI: Devyne Hauger is a 79 y.o. female, with history of atrial fibrillation and CVA in September of last year, who is on Coumadin, history of seizures, was in usual state of health however this morning while admitting in her house she had a sensation of warmth and flushing following which she passed out and fell hitting her left hip, of and left forehead to the floor, she sustained a laceration around her left forehead, started experiencing pain around her left elbow and left hip. Pt underwent left hip hemiarthroplasty on 09/22/11 with brief ETT. PMH: large old right CVA, acute right frontal/temporal infarct 2/13, dysphagia with 3 MBS' between 05/24/11-06/08/11. MBS 06/08/11 revealed flash penetration with thin liquids with recommendation of DYs 2 thin liquids, double swallows and intermittent throat clear.  Type of Study: Bedside swallow evaluation Previous Swallow Assessment: see HPI Diet Prior to this Study: Regular;Thin liquids Temperature Spikes Noted: No History of Recent Intubation: Yes Length of Intubations (days): 1 days Date extubated: 09/21/12 Behavior/Cognition: Alert;Cooperative;Pleasant mood Oral Cavity - Dentition: Adequate natural dentition Self-Feeding Abilities: Able to feed self Patient Positioning: Upright in bed Baseline Vocal Quality: Clear Volitional Cough: Strong Volitional Swallow: Able to elicit    Oral/Motor/Sensory Function Overall Oral Motor/Sensory Function: Impaired Labial ROM: Reduced left Labial Symmetry: Abnormal symmetry left Labial Strength: Reduced Labial Sensation: Reduced Lingual ROM: Within Functional Limits Facial ROM: Reduced left Facial Symmetry: Left droop Facial Strength: Reduced   Ice Chips Ice chips: Within functional limits     Thin Liquid Thin Liquid: Impaired Presentation: Cup;Straw Pharyngeal  Phase Impairments: Multiple swallows;Throat Clearing - Immediate;Cough - Delayed    Nectar Thick Nectar Thick Liquid: Not tested   Honey Thick Honey Thick Liquid: Not tested   Puree Puree: Within functional limits   Solid   GO    Solid: Within functional limits       Cabella Kimm, Riley Nearing 09/24/2012,11:53 AM

## 2012-09-24 NOTE — Progress Notes (Signed)
Patient Name: [name] Date of Encounter: 09/24/2012   Principal Problem:  *Hip fracture Active Problems:  Atrial fibrillation  CVA (cerebral infarction)  History of CVA (cerebrovascular accident)  Fall  Hypothyroidism  HTN (hypertension)  Seizure  Syncope   SUBJECTIVE  Denies CP or dyspnea  CURRENT MEDS    . atorvastatin  10 mg Oral q1800  . diltiazem  240 mg Oral Daily  . docusate sodium  100 mg Oral BID  . feeding supplement  237 mL Oral BID BM  . levETIRAcetam  500 mg Oral BID  . levothyroxine  50 mcg Oral QAC breakfast  . mirtazapine  15 mg Oral QHS  . multivitamin with minerals  1 tablet Oral Q1200  . polycarbophil  625 mg Oral Q1200  . polyethylene glycol  17 g Oral Daily  . sodium chloride  3 mL Intravenous Q12H  . Warfarin - Pharmacist Dosing Inpatient   Does not apply q1800    OBJECTIVE  Filed Vitals:   09/23/12 1601 09/23/12 1956 09/24/12 0446 09/24/12 0618  BP: 89/44 94/47 94/54    Pulse: 110 97 93   Temp: 98.2 F (36.8 C) 97.9 F (36.6 C) 98.4 F (36.9 C)   TempSrc: Oral Oral Oral   Resp: 20 18 18    Height:    5\' 8"  (1.727 m)  Weight:      SpO2: 100% 93% 94%     Intake/Output Summary (Last 24 hours) at 09/24/12 0729 Last data filed at 09/24/12 0600  Gross per 24 hour  Intake    480 ml  Output    600 ml  Net   -120 ml   Filed Weights   09/20/12 2345  Weight: 114 lb 6.7 oz (51.9 kg)   PHYSICAL EXAM  General: Pleasant, NAD. Neuro: Alert and oriented X 3.  Psych: Flat affect. HEENT:  Notable for significant bruising and swelling of face/orbits.  Neck: Supple without bruits or JVD. Lungs:  Resp regular and unlabored, CTA. Heart: IR, IR, no s3, s4, or murmurs. Abdomen: Soft, non-tender, non-distended Extremities: No edema. Left hip incision dsg d/i.  Accessory Clinical Findings  CBC  Basename 09/24/12 0455 09/23/12 0725  WBC 8.8 9.8  NEUTROABS -- --  HGB 7.2* 8.4*  HCT 21.5* 24.2*  MCV 90.3 89.3  PLT 121* 125*    Basic Metabolic Panel  Basename 09/24/12 0455 09/23/12 0725  NA 136 139  K 3.4* 3.5  CL 103 107  CO2 30 28  GLUCOSE 102* 107*  BUN 20 17  CREATININE 0.78 0.70  CALCIUM 8.5 8.6  MG -- --  PHOS -- --   Cardiac Enzymes  Basename 09/21/12 0834  CKTOTAL --  CKMB --  CKMBINDEX --  TROPONINI <0.30   Thyroid Function Tests No results found for this basename: TSH,T4TOTAL,FREET3,T3FREE,THYROIDAB in the last 72 hours Lab Results  Component Value Date   INR 2.90* 09/24/2012   INR 2.68* 09/23/2012   INR 1.30 09/22/2012    TELE  Coarse Afib, 90's.  Assessment & Plan  1.  Syncope:  Remains in afib.  No significant tachy/brady arrhythmias noted on tele.  Will need outpt event monitor @ d/c.  2.  Left Hip Fx:  S/p hemiarthroplasty.  Per ortho.  3.  Afib:  Reasonably rate controlled.  Cont current dose of dilt.  INR therapeutic; continue coumadin  4.  HTN:  Soft @ times.  Cont current dose of dilt.  5.  Anemia - no obvious active blood loss; may  need transfusion; fu HGB; management per primary care.  Signed, [name] MD

## 2012-09-24 NOTE — Progress Notes (Addendum)
Patient ID: [name]  female  UJW:[id number]    DOB: 1931-07-04    DOA: 09/20/2012  PCP: [name], MD   Addendum;  MRI brain reviewed:  No acute infarction.  Acute/subacute hemorrhage within the choroid of the left lateral ventricle with a small amount of intraventricular blood layering dependently. Small amount of subdural blood along both convexities left more than right, but only a few millimeters in thickness. - INR 2.9.  Hold coumadin and reverse INR (5mg  Vit K, 2 units FFP)  - recheck INR after FFP is completed - Neurology consult called, d/w Dr Marland Kitchen M.D. [facility] 09/24/2012, 2:58 PM  Pager: [phone]     Addendum  Received a report from RN that patient was neglectful on her left side noticed during physical therapy session today. - CT head on 09/20/2012 was neg for ac CVA. Patient has been therapeutic with INR, on Coumadin, was started on heparin gtt and coumadin right after surgery. Heparin gtt was DC'ed yesterday once INR was therapeutic - Will obtain an MRI of the brain to rule out acute stroke. If positive, will initiate stroke w/u, neuro consult.    Jadan Rouillard M.D. [facility] 09/24/2012, 11:49 AM  Pager: [phone]       Assessment/Plan: Principal Problem:   *Hip fracture - ortho following, POD #3 - start PT  Active Problems:  Atrial fibrillation - Rate controlled on Cardizem,  - INR therapeutic, 2.9, continue Coumadin. Off heparin gtt   Syncope - cardiology folowing, cont tele, out-patient work-up    History of CVA (cerebrovascular accident) - resumed warfarin after surgery  Anemia postop - Hemoglobin down to 7.2 today, will transfuse 2 units pRBC's    Fall: PT evaluation recommending skilled nursing facility   Hypothyroidism; cont synthroid   HTN (hypertension): stable  Seizures: cont keppra  Hypokalemia: replaced  DVT Prophylaxis: started on coumadin   Code Status: FC  Disposition: will  likely need SNF, placed social work consult   Subjective: - pain controlled, more alert today, tolerating solids, facial bruising and swelling significant better from admission.  She can actually open her left eye today.  Objective: Weight change:   Intake/Output Summary (Last 24 hours) at 09/24/12 1033 Last data filed at 09/24/12 0600  Gross per 24 hour  Intake    360 ml  Output    600 ml  Net   -240 ml   Blood pressure 94/54, pulse 93, temperature 98.4 F (36.9 C), temperature source Oral, resp. rate 18, height 5\' 8"  (1.727 m), weight 51.9 kg (114 lb 6.7 oz), SpO2 94.00%.  Physical Exam: General: Alert and awake, frail HEENT: marked right and left periorbital bruising and ecchymosis, IMPROVING, suturing on forehead CVS: ireg ireg Chest: clear to auscultation bilaterally, no wheezing, rales or rhonchi Abdomen: soft NT, ND, NBS Extremities: no edema   Lab Results: Basic Metabolic Panel:  Lab 09/24/12 6295 09/23/12 0725  NA 136 139  K 3.4* 3.5  CL 103 107  CO2 30 28  GLUCOSE 102* 107*  BUN 20 17  CREATININE 0.78 0.70  CALCIUM 8.5 8.6  MG -- --  PHOS -- --   CBC:  Lab 09/24/12 0455 09/23/12 0725 09/20/12 0943  WBC 8.8 9.8 --  NEUTROABS -- -- 5.4  HGB 7.2* 8.4* --  HCT 21.5* 24.2* --  MCV 90.3 89.3 --  PLT 121* 125* --   Cardiac Enzymes:  Lab 09/21/12 0834 09/20/12 2322 09/20/12 1811  CKTOTAL -- -- --  CKMB -- -- --  CKMBINDEX -- -- --  TROPONINI <0.30 <0.30 <0.30   BNP: No components found with this basename: POCBNP:2 CBG: No results found for this basename: GLUCAP:5 in the last 168 hours   Micro Results: No results found for this or any previous visit (from the past 240 hour(s)).  Studies/Results: Dg Elbow 2 Views Left  09/20/2012  *RADIOLOGY REPORT*  Clinical Data: Fall  LEFT ELBOW - 2 VIEW  Comparison: None.  Findings: No acute fracture.  No dislocation.  No obvious joint effusion.  Osteopenia.  IMPRESSION: No acute bony pathology.   Original  Report Authenticated By: Jolaine Click, M.D.    Dg Wrist 2 Views Left  09/20/2012  *RADIOLOGY REPORT*  Clinical Data: Left arm pain with abrasions at left elbow  LEFT WRIST - 2 VIEW  Comparison: Concurrently obtained radiographs of the shoulder and elbow  Findings: The bones are diffusely osteopenic which can limit evaluation for nondisplaced fracture.  AP and lateral views of the wrist demonstrate no acute fracture, or malalignment.  The carpus appears congruent.  Mild soft tissue swelling about the wrist.  IMPRESSION:  1. Mild soft tissue swelling about the wrist without evidence of acute fracture or malalignment.  2.  Advanced osteopenia which can limits the sensitivity for detection of occult fracture.  If clinical concern persists for fracture consider repeat imaging in 10 - 14 days as occult fracture may become more evident secondary to bony resorption and callus formation.   Original Report Authenticated By: Malachy Moan, M.D.    Dg Hip Complete Left  09/20/2012  *RADIOLOGY REPORT*  Clinical Data: Left hip pain  LEFT HIP - COMPLETE 2+ VIEW  Comparison: None.  Findings: No acute displaced transcervical fracture through the mid left femoral neck.  The distal fracture fragment is displaced superiorly with respect to the femoral head by one half shaft width.  The remainder of the bony pelvis appears intact.  The femoral head is located on cross-table lateral view.  Unremarkable bowel gas pattern.  IMPRESSION:  Acute transcervical fracture of the left femoral neck.  The femur is displaced superiorly by one half shaft width with respect to the femoral head.   Original Report Authenticated By: Malachy Moan, M.D.    Ct Head Wo Contrast  09/20/2012  *RADIOLOGY REPORT*  Clinical Data:  Syncopal episode resulting in a fall, hitting her head with subsequent headache.  CT HEAD WITHOUT CONTRAST CT MAXILLOFACIAL WITHOUT CONTRAST CT CERVICAL SPINE WITHOUT CONTRAST  Technique:  Multidetector CT imaging of the  head, cervical spine, and maxillofacial structures were performed using the standard protocol without intravenous contrast. Multiplanar CT image reconstructions of the cervical spine and maxillofacial structures were also generated.  Comparison:  Previous examinations.  CT HEAD  Findings: No significant change in an old left middle cerebral artery distribution infarct with associated midline shift to the right.  An old right caudate body lacunar infarct is again demonstrated.  There is a left facial laceration and associated soft tissue blood and air.  No skull fracture, intracranial hemorrhage or paranasal sinus air-fluid levels are seen.  There is minimal left maxillary sinus mucosal thickening.  IMPRESSION:  1.  No skull fracture or intracranial hemorrhage. 2.  Stable atrophy and old infarcts. 3.  Left facial laceration.  CT MAXILLOFACIAL  Findings:  Left facial soft tissue laceration with associated blood and air in the subcutaneous fat.  No fractures or paranasal sinus air-fluid levels.  IMPRESSION: Left facial soft tissue laceration without fracture.  CT CERVICAL SPINE  Findings:   Multilevel degenerative changes are stable.  No prevertebral soft tissue swelling, fractures or subluxations. Stable right apical pleural thickening and surgical staples.  IMPRESSION:  1.  No fracture or subluxation. 2.  Stable degenerative changes.   Original Report Authenticated By: Beckie Salts, M.D.    Ct Cervical Spine Wo Contrast  09/20/2012  *RADIOLOGY REPORT*  Clinical Data:  Syncopal episode resulting in a fall, hitting her head with subsequent headache.  CT HEAD WITHOUT CONTRAST CT MAXILLOFACIAL WITHOUT CONTRAST CT CERVICAL SPINE WITHOUT CONTRAST  Technique:  Multidetector CT imaging of the head, cervical spine, and maxillofacial structures were performed using the standard protocol without intravenous contrast. Multiplanar CT image reconstructions of the cervical spine and maxillofacial structures were also generated.   Comparison:  Previous examinations.  CT HEAD  Findings: No significant change in an old left middle cerebral artery distribution infarct with associated midline shift to the right.  An old right caudate body lacunar infarct is again demonstrated.  There is a left facial laceration and associated soft tissue blood and air.  No skull fracture, intracranial hemorrhage or paranasal sinus air-fluid levels are seen.  There is minimal left maxillary sinus mucosal thickening.  IMPRESSION:  1.  No skull fracture or intracranial hemorrhage. 2.  Stable atrophy and old infarcts. 3.  Left facial laceration.  CT MAXILLOFACIAL  Findings:  Left facial soft tissue laceration with associated blood and air in the subcutaneous fat.  No fractures or paranasal sinus air-fluid levels.  IMPRESSION: Left facial soft tissue laceration without fracture.  CT CERVICAL SPINE  Findings:   Multilevel degenerative changes are stable.  No prevertebral soft tissue swelling, fractures or subluxations. Stable right apical pleural thickening and surgical staples.  IMPRESSION:  1.  No fracture or subluxation. 2.  Stable degenerative changes.   Original Report Authenticated By: Beckie Salts, M.D.    Dg Chest Port 1 View  09/20/2012  *RADIOLOGY REPORT*  Clinical Data: Syncope resulting in a fall.  PORTABLE CHEST - 1 VIEW  Comparison: 11/15/2011.  Findings: Stable enlarged cardiac silhouette and hyperexpanded lungs.  The lungs remain clear.  Right apical surgical staples and pleural thickening are unchanged.  Mild left apical pleural thickening is again demonstrated.  Mild scoliosis.  IMPRESSION: No acute abnormality.  Stable cardiomegaly and changes of COPD.   Original Report Authenticated By: Beckie Salts, M.D.    Dg Shoulder Left  09/20/2012  *RADIOLOGY REPORT*  Clinical Data: History of fall complaining of left shoulder pain.  LEFT SHOULDER - 2+ VIEW  Comparison: No priors.  Findings: Three views of the left shoulder demonstrate no acute displaced  fracture, subluxation, dislocation, joint or soft tissue abnormality.  IMPRESSION: 1.  No acute radiographic abnormality of the left shoulder.   Original Report Authenticated By: Trudie Reed, M.D.    Ct Maxillofacial Wo Cm  09/20/2012  *RADIOLOGY REPORT*  Clinical Data:  Syncopal episode resulting in a fall, hitting her head with subsequent headache.  CT HEAD WITHOUT CONTRAST CT MAXILLOFACIAL WITHOUT CONTRAST CT CERVICAL SPINE WITHOUT CONTRAST  Technique:  Multidetector CT imaging of the head, cervical spine, and maxillofacial structures were performed using the standard protocol without intravenous contrast. Multiplanar CT image reconstructions of the cervical spine and maxillofacial structures were also generated.  Comparison:  Previous examinations.  CT HEAD  Findings: No significant change in an old left middle cerebral artery distribution infarct with associated midline shift to the right.  An old right caudate body lacunar infarct is again demonstrated.  There  is a left facial laceration and associated soft tissue blood and air.  No skull fracture, intracranial hemorrhage or paranasal sinus air-fluid levels are seen.  There is minimal left maxillary sinus mucosal thickening.  IMPRESSION:  1.  No skull fracture or intracranial hemorrhage. 2.  Stable atrophy and old infarcts. 3.  Left facial laceration.  CT MAXILLOFACIAL  Findings:  Left facial soft tissue laceration with associated blood and air in the subcutaneous fat.  No fractures or paranasal sinus air-fluid levels.  IMPRESSION: Left facial soft tissue laceration without fracture.  CT CERVICAL SPINE  Findings:   Multilevel degenerative changes are stable.  No prevertebral soft tissue swelling, fractures or subluxations. Stable right apical pleural thickening and surgical staples.  IMPRESSION:  1.  No fracture or subluxation. 2.  Stable degenerative changes.   Original Report Authenticated By: Beckie Salts, M.D.     Medications: Scheduled Meds:     . atorvastatin  10 mg Oral q1800  . diltiazem  240 mg Oral Daily  . docusate sodium  100 mg Oral BID  . feeding supplement  237 mL Oral BID BM  . levETIRAcetam  500 mg Oral BID  . levothyroxine  50 mcg Oral QAC breakfast  . mirtazapine  15 mg Oral QHS  . multivitamin with minerals  1 tablet Oral Q1200  . polycarbophil  625 mg Oral Q1200  . polyethylene glycol  17 g Oral Daily  . sodium chloride  3 mL Intravenous Q12H  . warfarin  5 mg Oral ONCE-1800  . Warfarin - Pharmacist Dosing Inpatient   Does not apply q1800      LOS: 4 days   Lorea Kupfer M.D. [facility] 09/24/2012, 10:33 AM Pager: [phone]  If 7PM-7AM, please contact night-coverage [email or web address] Password [email or web address]

## 2012-09-24 NOTE — Consult Note (Signed)
Referring Physician: Dr. Isidoro Donning    Chief Complaint: brief loss of conciousness, MRI with hemorrhage  HPI: [name] is an 79 y.o. female who was admitted on 09/20/2012 after standing at the sink and turning quickly, she felt warm and flushed and fell to the floor with possible syncope, she is not sure, her husband was there. He denies seeing any seizure-like activity. She sustained left hip fracture for which she had surgery on 09/21/2012 after reversal of her coumadin for which she takes for atrial fibrillation. Today, INR is therapeutic. Patient is alert.    Today, she was at bedside commode, had unresponsiveness, respirations slowed, heart rate increased to 160s. SBP 96. Code started, but patient returned to baseline once back to bed, < 1 minute. Of note, she had taken 2 vicodin about 1 hour prior to this event. Since patient had trauma 4 days ago and had episode of unresponsiveness, she was taken to MRI scan which showed acute/subacute hemorrhage within the choroid of the L lateral ventricle with a small amount of intraventricular blood layering  dependently. Small amount of subdural blood along both convexities L>R, but only a few millimeters in thickness.  She has had a R MCA CVA  And received tPA (05/2011) that has left her with left sided weakness. During that time, she also had seizures and was placed on Keppra which she still takes.  Date last known well: 09/24/2012 Time last known well: 1408  tPA Given: No: Return to baseline. MRI with hemorrhage L ventricle, subdural.   Past Medical History  Diagnosis Date  . Dysphagia   . Atrial fibrillation     a. Dx 05/2011 at time of CVA.  Marland Kitchen Hyperlipidemia   . HTN (hypertension)   . Hypothyroidism   . Pneumonia     "alot of pneumonia" (09/21/2012)  . Syncope and collapse     "fell @ [place]; hit left forehead"; left hip fx/notes 09/20/2012  . Vertigo   . Arthritis     "some; all over" (09/22/2011)  . Anxiety   . Depression   . Seizures       a. 05/2011 right MCA CVA in setting of new onset afib s/p thrombolysis/thrombolytics. b. Recurrent CVA 10/2011 in setting of subtherap.INR.    Marland Kitchen Seizure 10/2011    "only one she ever had was 3 months after her stroke" (09/21/2012  . Stroke 05/2011    a. 05/2011 right MCA CVA in setting of new onset afib s/p thrombolysis/thrombolytics. b. Recurrent CVA 10/2011 in setting of subtherap.INR.  Marland Kitchen Stroke 05/2011    history of embolic CVA right hemisphere with left hemiparesis/notes 09/20/2012    Past Surgical History  Procedure Date  . Lumbar disc surgery 1981  . Vaginal hysterectomy 1982  . Pleural scarification 1977?; 1986    "2 lung surgeries; my lungs had collapsed" (09/21/2012)  . Tonsillectomy and adenoidectomy 1950?  Marland Kitchen Appendectomy 1982  . Thrombolysis 05/2011    [name] 09/20/2012  . Hip arthroplasty 09/21/2012    Procedure: ARTHROPLASTY BIPOLAR HIP;  Surgeon: Kerrin Champagne, MD;  Location: [facility] OR;  Service: Orthopedics;  Laterality: Left;    Family History  Problem Relation Age of Onset  . CAD Neg Hx    Social History: she states that she quit smoking 32 years ago. She reports that she drinks "socially" about .6 ounces of alcohol per week and denies the use of illicit drugs.  Allergies: no known drug allergies  Current Facility-Administered Medications  Medication Dose Route Frequency  Provider Last Rate Last Dose  . acetaminophen (TYLENOL) tablet 650 mg  650 mg Oral Q6H PRN Kerrin Champagne, MD   650 mg at 09/23/12 1443   Or  . acetaminophen (TYLENOL) suppository 650 mg  650 mg Rectal Q6H PRN Kerrin Champagne, MD      . albuterol (PROVENTIL) (5 MG/ML) 0.5% nebulizer solution 2.5 mg  2.5 mg Nebulization Q2H PRN Leroy Sea, MD      . alum & mag hydroxide-simeth (MAALOX/MYLANTA) 200-200-20 MG/5ML suspension 30 mL  30 mL Oral Q6H PRN Leroy Sea, MD      . atorvastatin (LIPITOR) tablet 10 mg  10 mg Oral q1800 Leroy Sea, MD   10 mg at 09/23/12 1809  . bisacodyl (DULCOLAX) EC tablet 5  mg  5 mg Oral Daily PRN Kerrin Champagne, MD      . digoxin (LANOXIN) 0.25 MG/ML injection 0.25 mg  0.25 mg Intravenous Once Rhonda G Barrett, PA      . digoxin (LANOXIN) 0.25 MG/ML injection 0.5 mg  0.5 mg Intravenous Once Rhonda G Barrett, PA      . digoxin (LANOXIN) tablet 0.125 mg  0.125 mg Oral Daily Rhonda G Barrett, PA      . diltiazem (CARDIZEM CD) 24 hr capsule 240 mg  240 mg Oral Daily Lewayne Bunting, MD   240 mg at 09/24/12 1424  . docusate sodium (COLACE) capsule 100 mg  100 mg Oral BID Kerrin Champagne, MD   100 mg at 09/23/12 2259  . feeding supplement (ENSURE COMPLETE) liquid 237 mL  237 mL Oral BID BM Ailene Ards, RD   237 mL at 09/24/12 1400  . guaiFENesin-dextromethorphan (ROBITUSSIN DM) 100-10 MG/5ML syrup 5 mL  5 mL Oral Q4H PRN Leroy Sea, MD      . HYDROcodone-acetaminophen (NORCO/VICODIN) 5-325 MG per tablet 1-2 tablet  1-2 tablet Oral Q6H PRN Kerrin Champagne, MD   2 tablet at 09/24/12 1423  . levETIRAcetam (KEPPRA) tablet 500 mg  500 mg Oral BID Leroy Sea, MD   500 mg at 09/24/12 1424  . levothyroxine (SYNTHROID, LEVOTHROID) tablet 50 mcg  50 mcg Oral QAC breakfast Leroy Sea, MD   50 mcg at 09/24/12 [phone]  . menthol-cetylpyridinium (CEPACOL) lozenge 3 mg  1 lozenge Oral PRN Kerrin Champagne, MD       Or  . phenol (CHLORASEPTIC) mouth spray 1 spray  1 spray Mouth/Throat PRN Kerrin Champagne, MD      . metoCLOPramide (REGLAN) tablet 5-10 mg  5-10 mg Oral Q8H PRN Kerrin Champagne, MD       Or  . metoCLOPramide (REGLAN) injection 5-10 mg  5-10 mg Intravenous Q8H PRN Kerrin Champagne, MD      . mirtazapine (REMERON) tablet 15 mg  15 mg Oral QHS Leroy Sea, MD   15 mg at 09/23/12 2259  . morphine 2 MG/ML injection 0.5 mg  0.5 mg Intravenous Q2H PRN Kerrin Champagne, MD      . multivitamin with minerals tablet 1 tablet  1 tablet Oral Q1200 Leroy Sea, MD   1 tablet at 09/23/12 1200  . ondansetron (ZOFRAN) tablet 4 mg  4 mg Oral Q6H PRN Leroy Sea, MD        Or  . ondansetron (ZOFRAN) injection 4 mg  4 mg Intravenous Q6H PRN Leroy Sea, MD      . phytonadione (VITAMIN K) 5 mg  in dextrose 5 % 50 mL IVPB  5 mg Intravenous Once Ripudeep K Rai, MD      . polycarbophil (FIBERCON) tablet 625 mg  625 mg Oral Q1200 Leroy Sea, MD   625 mg at 09/23/12 1200  . polyethylene glycol (MIRALAX / GLYCOLAX) packet 17 g  17 g Oral Daily PRN Kerrin Champagne, MD      . polyethylene glycol (MIRALAX / GLYCOLAX) packet 17 g  17 g Oral Daily Leroy Sea, MD   17 g at 09/24/12 1000  . senna-docusate (Senokot-S) tablet 1 tablet  1 tablet Oral QHS PRN Leroy Sea, MD      . sodium chloride 0.9 % injection 3 mL  3 mL Intravenous Q12H Leroy Sea, MD   3 mL at 09/23/12 1005  . Warfarin - Pharmacist Dosing Inpatient   Does not apply q1800 Ripudeep K Rai, MD        ROS: History obtained from spouse, chart review and the patient  General ROS: negative for - chills, fatigue, fever, night sweats, weight gain or weight loss Psychological ROS: negative for - behavioral disorder, hallucinations, memory difficulties, mood swings or suicidal ideation Ophthalmic ROS: negative for - blurry vision, double vision, eye pain or loss of vision ENT ROS: negative for - epistaxis, nasal discharge, oral lesions, sore throat, tinnitus or vertigo Allergy and Immunology ROS: negative for - hives or itchy/watery eyes Hematological and Lymphatic ROS: negative for - bleeding problems, bruising or swollen lymph nodes Endocrine ROS: negative for - galactorrhea, hair pattern changes, polydipsia/polyuria or temperature intolerance Respiratory ROS: negative for - cough, hemoptysis, shortness of breath or wheezing Cardiovascular ROS: negative for - chest pain, dyspnea on exertion, edema, + irregular heartbeat Gastrointestinal ROS: negative for - abdominal pain, diarrhea, hematemesis, nausea/vomiting or stool incontinence Genito-Urinary ROS: negative for - dysuria, hematuria,  incontinence or urinary frequency/urgency Musculoskeletal ROS: negative for - joint swelling, +muscular weakness L arm (previous stroke), +L leg due to pain/surgery Neurological ROS: as noted in HPI Dermatological ROS: negative for rash, +skin lesion changes due to bruising secondary to fall being on coumadin    Physical Examination: Blood pressure 114/60, pulse 118, temperature 98.9 F (37.2 C), temperature source Oral, resp. rate 18, height 5\' 8"  (1.727 m), weight 51.9 kg (114 lb 6.7 oz), SpO2 97.00%.  Neurologic Examination: Mental Status: Alert, confused.  Speech fluent without evidence of aphasia.  Able to follow 3 step commands without difficulty.  Appearance: entire left face is bruised, swollen and included is the neck region. No obstruction to breathing. Cranial Nerves: II: visual fields grossly normal, pupils equal, round, reactive to light and accommodation III,IV, VI: ptosis not present, extra-ocular motions intact bilaterally V,VII: smile symmetric, facial light touch sensation normal bilaterally. She does have bruising of the left side of her head which does affect her face-moreso her peri-orbits and neck region. VIII: hearing normal bilaterally IX,X: gag reflex present XI: trapezius strength/neck flexion strength normal bilaterally XII: tongue strength normal, she does have small area on tip of left tongue that is bruised as well. Motor: Right : Upper extremity   5/5    Left:     Upper extremity   5-/5  Lower extremity   5/5     Lower extremity   3+/5 (decreased ROM due to surgery) Tone and bulk:normal tone throughout; no atrophy noted, LE warm pink with palpable pulses Sensory: Pinprick and light touch absent on left body. Rare, but it does occur, she will state  she feels touch on left side, but inconsistent. Deep Tendon Reflexes: 2+ symmetric int he upper extremities. Did not test left knee. Plantars: Right: downgoing   Left: downgoing Cerebellar: Unable to test     Results for orders placed during the hospital encounter of 09/20/12 (from the past 48 hour(s))  BASIC METABOLIC PANEL     Status: Abnormal   Collection Time   09/23/12  7:25 AM      Component Value Range Comment   Sodium 139  135 - 145 mEq/L    Potassium 3.5  3.5 - 5.1 mEq/L    Chloride 107  96 - 112 mEq/L    CO2 28  19 - 32 mEq/L    Glucose, Bld 107 (*) 70 - 99 mg/dL    BUN 17  6 - 23 mg/dL    Creatinine, Ser 1.61  0.50 - 1.10 mg/dL    Calcium 8.6  8.4 - 09.6 mg/dL    GFR calc non Af Amer 80 (*) >90 mL/min    GFR calc Af Amer >90  >90 mL/min   HEPARIN LEVEL (UNFRACTIONATED)     Status: Abnormal   Collection Time   09/23/12  7:25 AM      Component Value Range Comment   Heparin Unfractionated 0.24 (*) 0.30 - 0.70 IU/mL   CBC     Status: Abnormal   Collection Time   09/23/12  7:25 AM      Component Value Range Comment   WBC 9.8  4.0 - 10.5 K/uL    RBC 2.71 (*) 3.87 - 5.11 MIL/uL    Hemoglobin 8.4 (*) 12.0 - 15.0 g/dL    HCT 04.5 (*) 40.9 - 46.0 %    MCV 89.3  78.0 - 100.0 fL    MCH 31.0  26.0 - 34.0 pg    MCHC 34.7  30.0 - 36.0 g/dL    RDW 81.1  91.4 - 78.2 %    Platelets 125 (*) 150 - 400 K/uL   PROTIME-INR     Status: Abnormal   Collection Time   09/23/12  7:25 AM      Component Value Range Comment   Prothrombin Time 27.2 (*) 11.6 - 15.2 seconds    INR 2.68 (*) 0.00 - 1.49   BASIC METABOLIC PANEL     Status: Abnormal   Collection Time   09/24/12  4:55 AM      Component Value Range Comment   Sodium 136  135 - 145 mEq/L    Potassium 3.4 (*) 3.5 - 5.1 mEq/L    Chloride 103  96 - 112 mEq/L    CO2 30  19 - 32 mEq/L    Glucose, Bld 102 (*) 70 - 99 mg/dL    BUN 20  6 - 23 mg/dL    Creatinine, Ser 9.56  0.50 - 1.10 mg/dL    Calcium 8.5  8.4 - 21.3 mg/dL    GFR calc non Af Amer 77 (*) >90 mL/min    GFR calc Af Amer 90 (*) >90 mL/min   CBC     Status: Abnormal   Collection Time   09/24/12  4:55 AM      Component Value Range Comment   WBC 8.8  4.0 - 10.5 K/uL    RBC 2.38 (*)  3.87 - 5.11 MIL/uL    Hemoglobin 7.2 (*) 12.0 - 15.0 g/dL    HCT 08.6 (*) 57.8 - 46.0 %    MCV 90.3  78.0 - 100.0  fL    MCH 30.3  26.0 - 34.0 pg    MCHC 33.5  30.0 - 36.0 g/dL    RDW 29.5  28.4 - 13.2 %    Platelets 121 (*) 150 - 400 K/uL   PROTIME-INR     Status: Abnormal   Collection Time   09/24/12  4:55 AM      Component Value Range Comment   Prothrombin Time 28.8 (*) 11.6 - 15.2 seconds    INR 2.90 (*) 0.00 - 1.49   TYPE AND SCREEN     Status: Normal (Preliminary result)   Collection Time   09/24/12 11:40 AM      Component Value Range Comment   ABO/RH(D) O POS      Antibody Screen NEG      Sample Expiration 09/27/2012      Unit Number [id number]      Blood Component Type RED CELLS,LR      Unit division 00      Status of Unit ISSUED      Transfusion Status OK TO TRANSFUSE      Crossmatch Result Compatible      Unit Number [id number]      Blood Component Type RBC LR PHER1      Unit division 00      Status of Unit ALLOCATED      Transfusion Status OK TO TRANSFUSE      Crossmatch Result Compatible     PREPARE RBC (CROSSMATCH)     Status: Normal   Collection Time   09/24/12 11:40 AM      Component Value Range Comment   Order Confirmation ORDER PROCESSED BY BLOOD BANK     PREPARE FRESH FROZEN PLASMA     Status: Normal (Preliminary result)   Collection Time   09/24/12  2:58 PM      Component Value Range Comment   Unit Number [id number]      Blood Component Type THAWED PLASMA      Unit division 00      Status of Unit ALLOCATED      Transfusion Status OK TO TRANSFUSE      Unit Number [id number]      Blood Component Type THAWED PLASMA      Unit division 00      Status of Unit ALLOCATED      Transfusion Status OK TO TRANSFUSE     GLUCOSE, CAPILLARY     Status: Abnormal   Collection Time   09/24/12  3:08 PM      Component Value Range Comment   Glucose-Capillary 123 (*) 70 - 99 mg/dL    Comment 1 Notify RN      Comment 2 Documented in Chart      Mr Brain Wo  Contrast 09/24/2012  *RADIOLOGY REPORT*  Clinical Data: Loss of consciousness with trauma to the head.  MRI HEAD WITHOUT CONTRAST  Technique:  Multiplanar, multiecho pulse sequences of the brain and surrounding structures were obtained according to standard protocol without intravenous contrast.  Comparison: CT 09/20/2012.  MRI 11/15/2011  Findings: Diffusion imaging is negative for acute or subacute infarction.  There is an old infarction in the right middle cerebral artery territory affecting the temporal lobe, insula and frontoparietal region which has progressed atrophy, encephalomalacia and adjacent gliosis.  There is some old hemosiderin deposition.  There is some intraventricular hemorrhage layering dependently . There is some acute or subacute hemorrhage apparently in the substance of the chorioid in the left lateral ventricle.  This appeared normal on the previous study and therefore I would doubt the presence of a vascular malformation.  I think there is a very small amount of subdural hematoma on the left and on the right, only a few millimeters in thickness.  No hydrocephalus.  No mass lesion.  Left facial hematoma again noted.  IMPRESSION: No acute infarction.  Acute/subacute hemorrhage within the choroid of the left lateral ventricle with a small amount of intraventricular blood layering dependently.  Small amount of subdural blood along both convexities left more than right, but only a few millimeters in thickness.   Original Report Authenticated By: Paulina Fusi, M.D.    Job Founds, MBA, MHA [facility] Pager [phone]   Patient seen and examined.  Clinical course and management discussed.  Necessary edits performed.  I agree with the above.  Assessment and plan of care developed and discussed below.    Assessment/Plan: 79 year old female with acute intraventricular hemorrhage on imaging.  Patient on Coumadin with a therapeutic INR.  Based on imaging hemorrhage likely  spontaneous or possibly late and related to previous injury.  Exam at baseline other than confusion.  Do not expect a new ischemic event.  Recommendations: 1.  Coagulopathy reversal 2.  Hold all antiplatelets and anticoagulants 3.  Repeat head CT tomorrow afternoon    Thana Farr, MD [facility] [phone]  09/24/2012  5:49 PM

## 2012-09-24 NOTE — Progress Notes (Signed)
Called by RN for evaluation of patient. Per the patient had a presyncopal/syncopal episode on the commode today around 3pm. She had gotten up with assistance and became dizzy with decreased loss of consciousness.  Patient was evaluated by Dr. Vassie Loll (she did not need any interventions) and at the time of my evaluation, few mins later, she was alert awake, oriented and back to her baseline. Her heart rate was noticed to be in afib with RVR 120-130's with BP in 90's. After d/w cardiology, will give digoxin, she already has received oral Cardizem long-acting 240mg  this morning.  - Will continue pRBC, FFP to reverse INR given acute/subacute ICH. Also discussed with Dr. [name] (Neuro) in detail. - transfer to SDU for closer monitoring, frequent neuro checks q 4hrs.   RAI,RIPUDEEP M.D. [facility] 09/24/2012, 4:34 PM  Pager: [phone]

## 2012-09-24 NOTE — Consult Note (Signed)
Physical Medicine and Rehabilitation Consult Reason for Consult: Left hip fracture Referring Physician: [facility]   HPI: [name] is a 79 y.o. right-handed female with history of CVA 05/2011, atrial fibrillation with chronic Coumadin therapy and seizure disorder. Patient resides at [facility] independent [facility] with her husband. Patient independent prior to admission. Admitted 09/20/2012 after fall questionable related to syncope striking her left hip and left for head. She sustained a laceration around her left forehead. X-rays and imaging revealed a left transcervical hip fracture. Cranial CT scan showed no skull fracture or acute intracranial hemorrhage. There was an old right caudate body lacunar infarct. Cardiac enzymes were negative. EKG showed atrial fibrillation but no acute changes. She was cleared by cardiology services for surgical intervention. INR on admission of 2.4 and patient did receive vitamin K. Underwent unipolar left hip hemiarthroplasty 09/21/2012 per Dr. Otelia Sergeant. Patient remains on chronic Coumadin therapy for atrial fibrillation history of CVA. She is weightbearing as tolerated to the left lower extremity with posterior hip cautions. Postoperative anemia 7.2 and await direction all possible transfusion. Physical therapy evaluation completed an ongoing. M.D. is requested physical medicine rehabilitation consult consider inpatient rehabilitation services   Review of Systems  Cardiovascular: Positive for palpitations.  Neurological: Positive for seizures.  Psychiatric/Behavioral: Positive for depression.       Anxiety  All other systems reviewed and are negative.   Past Medical History  Diagnosis Date  . Dysphagia   . Atrial fibrillation     a. Dx 05/2011 at time of CVA.  Marland Kitchen Hyperlipidemia   . HTN (hypertension)   . Hypothyroidism   . Pneumonia     "alot of pneumonia" (09/21/2012)  . Syncope and collapse     "fell @ [facility]; hit left forehead"; left hip fx/notes  09/20/2012  . Vertigo   . Arthritis     "some; all over" (09/22/2011)  . Anxiety   . Depression   . Seizures      a. 05/2011 right MCA CVA in setting of new onset afib s/p thrombolysis/thrombolytics. b. Recurrent CVA 10/2011 in setting of subtherap.INR.    Marland Kitchen Seizure 10/2011    "only one she ever had was 3 months after her stroke" (09/21/2012  . Stroke 05/2011    a. 05/2011 right MCA CVA in setting of new onset afib s/p thrombolysis/thrombolytics. b. Recurrent CVA 10/2011 in setting of subtherap.INR.  Marland Kitchen Stroke 05/2011    history of embolic CVA right hemisphere with left hemiparesis/notes 09/20/2012   Past Surgical History  Procedure Date  . Lumbar disc surgery 1981  . Vaginal hysterectomy 1982  . Pleural scarification 1977?; 1986    "2 lung surgeries; my lungs had collapsed" (09/21/2012)  . Tonsillectomy and adenoidectomy 1950?  Marland Kitchen Appendectomy 1982  . Thrombolysis 05/2011    [name] 09/20/2012   Family History  Problem Relation Age of Onset  . CAD Neg Hx    Social History:  reports that she quit smoking about 32 years ago. Her smoking use included Cigarettes. She has a 15 pack-year smoking history. She has never used smokeless tobacco. She reports that she drinks about .6 ounces of alcohol per week. She reports that she does not use illicit drugs. Allergies: No Known Allergies Medications Prior to Admission  Medication Sig Dispense Refill  . atorvastatin (LIPITOR) 10 MG tablet Take 1 tablet (10 mg total) by mouth daily at 6 PM.  30 tablet  2  . Calcium Carbonate (CALTRATE 600 PO) Take 1 tablet by mouth daily at 12  noon.       . diltiazem (CARTIA XT) 240 MG 24 hr capsule Take 240 mg by mouth daily.      Tery Sanfilippo Calcium (STOOL SOFTENER PO) Take 1 capsule by mouth at bedtime.       . levETIRAcetam (KEPPRA) 500 MG tablet Take 1 tablet (500 mg total) by mouth 2 (two) times daily.  60 tablet  3  . levothyroxine (SYNTHROID, LEVOTHROID) 50 MCG tablet Take 50 mcg by mouth every morning.       .  mirtazapine (REMERON) 15 MG tablet Take 15 mg by mouth at bedtime.      . Multiple Vitamin (MULITIVITAMIN WITH MINERALS) TABS Take 1 tablet by mouth daily at 12 noon.      . polycarbophil (FIBERCON) 625 MG tablet Take 625 mg by mouth daily at 12 noon.      . polyethylene glycol powder (GLYCOLAX/MIRALAX) powder Take 17 g by mouth every morning.      . warfarin (COUMADIN) 5 MG tablet Take 1.5 tablets (7.5 mg total) by mouth every evening. Takes daily at 1700  30 tablet  0    Home: Home Living Lives With: Spouse Available Help at Discharge: Family;Available 24 hours/day Type of Home: Apartment ([facility]) Home Access: Level entry Home Layout: One level Home Adaptive Equipment: Shower chair with back  Functional History: Prior Function Able to Take Stairs?: Yes Driving: Yes Vocation: Retired Functional Status:  Mobility: Bed Mobility Bed Mobility: Supine to Sit;Sitting - Scoot to Edge of Bed Supine to Sit: 1: +2 Total assist;HOB elevated Supine to Sit: Patient Percentage: 20% Sitting - Scoot to Edge of Bed: 1: +2 Total assist Sitting - Scoot to Edge of Bed: Patient Percentage: 10% Sit to Supine: 1: +2 Total assist;HOB elevated Sit to Supine: Patient Percentage: 10% Transfers Transfers: Editor, commissioning Transfers: 1: +2 Total assist Stand Pivot Transfers: Patient Percentage: 10% Ambulation/Gait Ambulation/Gait Assistance: Not tested (comment)    ADL:    Cognition: Cognition Arousal/Alertness: Lethargic Orientation Level: Oriented to person;Oriented to place Cognition Overall Cognitive Status: Appears within functional limits for tasks assessed/performed Arousal/Alertness: Lethargic Orientation Level: Appears intact for tasks assessed Behavior During Session: Lethargic  Blood pressure 94/54, pulse 93, temperature 98.4 F (36.9 C), temperature source Oral, resp. rate 18, height 5\' 8"  (1.727 m), weight 51.9 kg (114 lb 6.7 oz), SpO2 94.00%. Physical Exam    Vitals reviewed. HENT:       Multiple bruising to the fore head and face.  Neck: Normal range of motion. Neck supple. No thyromegaly present.  Cardiovascular:       Cardiac rate controlled  Pulmonary/Chest: Effort normal and breath sounds normal.  Abdominal: Soft. Bowel sounds are normal. She exhibits no distension.  Neurological: She is alert.       Patient Her eyes closed during exam but was able to name person place and date of birth. She followed simple commands.  Skin:       Hip incision is dressed    Results for orders placed during the hospital encounter of 09/20/12 (from the past 24 hour(s))  BASIC METABOLIC PANEL     Status: Abnormal   Collection Time   09/23/12  7:25 AM      Component Value Range   Sodium 139  135 - 145 mEq/L   Potassium 3.5  3.5 - 5.1 mEq/L   Chloride 107  96 - 112 mEq/L   CO2 28  19 - 32 mEq/L   Glucose, Bld 107 (*)  70 - 99 mg/dL   BUN 17  6 - 23 mg/dL   Creatinine, Ser 7.82  0.50 - 1.10 mg/dL   Calcium 8.6  8.4 - 95.6 mg/dL   GFR calc non Af Amer 80 (*) >90 mL/min   GFR calc Af Amer >90  >90 mL/min  HEPARIN LEVEL (UNFRACTIONATED)     Status: Abnormal   Collection Time   09/23/12  7:25 AM      Component Value Range   Heparin Unfractionated 0.24 (*) 0.30 - 0.70 IU/mL  CBC     Status: Abnormal   Collection Time   09/23/12  7:25 AM      Component Value Range   WBC 9.8  4.0 - 10.5 K/uL   RBC 2.71 (*) 3.87 - 5.11 MIL/uL   Hemoglobin 8.4 (*) 12.0 - 15.0 g/dL   HCT 21.3 (*) 08.6 - 57.8 %   MCV 89.3  78.0 - 100.0 fL   MCH 31.0  26.0 - 34.0 pg   MCHC 34.7  30.0 - 36.0 g/dL   RDW 46.9  62.9 - 52.8 %   Platelets 125 (*) 150 - 400 K/uL  PROTIME-INR     Status: Abnormal   Collection Time   09/23/12  7:25 AM      Component Value Range   Prothrombin Time 27.2 (*) 11.6 - 15.2 seconds   INR 2.68 (*) 0.00 - 1.49  BASIC METABOLIC PANEL     Status: Abnormal   Collection Time   09/24/12  4:55 AM      Component Value Range   Sodium 136  135 - 145 mEq/L    Potassium 3.4 (*) 3.5 - 5.1 mEq/L   Chloride 103  96 - 112 mEq/L   CO2 30  19 - 32 mEq/L   Glucose, Bld 102 (*) 70 - 99 mg/dL   BUN 20  6 - 23 mg/dL   Creatinine, Ser 4.13  0.50 - 1.10 mg/dL   Calcium 8.5  8.4 - 24.4 mg/dL   GFR calc non Af Amer 77 (*) >90 mL/min   GFR calc Af Amer 90 (*) >90 mL/min  CBC     Status: Abnormal   Collection Time   09/24/12  4:55 AM      Component Value Range   WBC 8.8  4.0 - 10.5 K/uL   RBC 2.38 (*) 3.87 - 5.11 MIL/uL   Hemoglobin 7.2 (*) 12.0 - 15.0 g/dL   HCT 01.0 (*) 27.2 - 53.6 %   MCV 90.3  78.0 - 100.0 fL   MCH 30.3  26.0 - 34.0 pg   MCHC 33.5  30.0 - 36.0 g/dL   RDW 64.4  03.4 - 74.2 %   Platelets 121 (*) 150 - 400 K/uL  PROTIME-INR     Status: Abnormal   Collection Time   09/24/12  4:55 AM      Component Value Range   Prothrombin Time 28.8 (*) 11.6 - 15.2 seconds   INR 2.90 (*) 0.00 - 1.49   No results found.  Assessment/Plan: Diagnosis: left Femoral neck fx 1. Does the need for close, 24 hr/day medical supervision in concert with the patient's rehab needs make it unreasonable for this patient to be served in a less intensive setting? Potentially 2. Co-Morbidities requiring supervision/potential complications: afib, anemia 3. Due to bladder management, bowel management, safety, skin/wound care, disease management, pain management and patient education, does the patient require 24 hr/day rehab nursing? Potentially 4. Does the patient require coordinated  care of a physician, rehab nurse, PT (1-2 hrs/day, 5 days/week) and OT (1-2 hrs/day, 5 days/week) to address physical and functional deficits in the context of the above medical diagnosis(es)? Potentially Addressing deficits in the following areas: balance, endurance, locomotion, strength, transferring, bowel/bladder control, bathing, dressing, feeding, grooming, toileting and psychosocial support 5. Can the patient actively participate in an intensive therapy program of at least 3 hrs of therapy  per day at least 5 days per week? Potentially 6. The potential for patient to make measurable gains while on inpatient rehab is fair 7. Anticipated functional outcomes upon discharge from inpatient rehab are ?min to mod assist with PT, ?min to mod assist with OT, n/a with SLP. 8. Estimated rehab length of stay to reach the above functional goals is: n/a 9. Does the patient have adequate social supports to accommodate these discharge functional goals? Potentially 10. Anticipated D/C setting: Home 11. Anticipated post D/C treatments: HH therapy 12. Overall Rehab/Functional Prognosis: fair  RECOMMENDATIONS: This patient's condition is appropriate for continued rehabilitative care in the following setting: SNF Patient has agreed to participate in recommended program. Potentially Note that insurance prior authorization may be required for reimbursement for recommended care.  Comment: Given current functional levels and dispo, SNF is likely the most appropriate venue for rehab. She is currently en route to step down after syncopal episode today.   Ivory Broad, MD    09/24/2012

## 2012-09-24 NOTE — Progress Notes (Signed)
Pt placed on BSC. Complained of feeling dizzy. Call received of HR increase to 162. Upon placing cuff on pt to check BP, pt became unresponsive.  Code Blue called.  Pt placed in bed. NO compressions needed. Blood stopped momentarily. MD at bedside. Received orders to restart blood and transfer pt to stepdown. Pt currently at baseline. Will continue to monitor pt closely.

## 2012-09-25 ENCOUNTER — Inpatient Hospital Stay (HOSPITAL_COMMUNITY): Payer: Medicare Other

## 2012-09-25 ENCOUNTER — Encounter (HOSPITAL_COMMUNITY): Payer: Self-pay | Admitting: Radiology

## 2012-09-25 DIAGNOSIS — R339 Retention of urine, unspecified: Secondary | ICD-10-CM | POA: Diagnosis not present

## 2012-09-25 LAB — PREPARE FRESH FROZEN PLASMA: Unit division: 0

## 2012-09-25 LAB — BASIC METABOLIC PANEL
Calcium: 9.3 mg/dL (ref 8.4–10.5)
GFR calc Af Amer: >90 mL/min (ref >90)
GFR calc non Af Amer: 84 mL/min — ABNORMAL LOW (ref >90)
Glucose, Bld: 110 mg/dL — ABNORMAL HIGH (ref 70–99)
Sodium: 140 mEq/L (ref 135–145)

## 2012-09-25 LAB — CBC
Platelets: 136 10*3/uL — ABNORMAL LOW (ref 150–400)
RDW: 14.2 % (ref 11.5–15.5)
WBC: 12.1 10*3/uL — ABNORMAL HIGH (ref 4.0–10.5)

## 2012-09-25 LAB — PROTIME-INR
INR: 1.06 (ref 0.00–1.49)
Prothrombin Time: 13.7 seconds (ref 11.6–15.2)

## 2012-09-25 MED ORDER — SENNOSIDES-DOCUSATE SODIUM 8.6-50 MG PO TABS
2.0000 | ORAL_TABLET | Freq: Every day | ORAL | Status: DC
  Administered 2012-09-25 – 2012-10-01 (×6): 2 via ORAL
  Filled 2012-09-25 (×7): qty 2

## 2012-09-25 MED ORDER — BIOTENE DRY MOUTH MT LIQD
15.0000 mL | Freq: Two times a day (BID) | OROMUCOSAL | Status: DC
  Administered 2012-09-25 – 2012-09-30 (×11): 15 mL via OROMUCOSAL

## 2012-09-25 NOTE — Progress Notes (Addendum)
Patient ID: [name]  female  ZOX:[id number]    DOB: 10/31/1930    DOA: 09/20/2012  PCP: [name], MD  Chart reviewed. Discussed with Dr. [name].  Assessment/Plan:  intraventricular hemorrhage/subdural hematoma: Coumadin reversed. Continue step down monitoring. Repeat CT brain pending. Per Dr. [name], patient may be up with physical therapy.   *Hip fracture: TED hose, SCDs. No anticoagulation secondary to above  Urinary retention. Patient required Foley catheter replacement last night. Post Foley insertion returned greater than a liter of urine would not discontinue it until more mobile and able to participate in voiding trial   Atrial fibrillation - Rate controlled on Cardizem,   Syncope - cardiology folowing, cont tele, out-patient work-up    History of CVA (cerebrovascular accident)  Anemia postop Patient has had no stools since the second cell GI bleed unlikely and no GI consult needed at this time.   Fall: PT evaluation recommending skilled nursing facility   Hypothyroidism; cont synthroid   HTN (hypertension): stable  Seizures: cont keppra  Hypokalemia: replaced  Constipation: Will schedule Senokot and MiraLAX.  Code Status: FC  Disposition: will likely need SNF, placed social work consult   Subjective: No new complaints. Per nursing, no bowel movement since the second. Not eating much. Husband is worried about the hematoma on her left cheek. Has not been out of bed today.  Objective: Weight change:   Intake/Output Summary (Last 24 hours) at 09/25/12 1219 Last data filed at 09/25/12 1144  Gross per 24 hour  Intake    766 ml  Output   1125 ml  Net   -359 ml   Blood pressure 100/46, pulse 74, temperature 98.8 F (37.1 C), temperature source Oral, resp. rate 13, height 5\' 8"  (1.727 m), weight 52 kg (114 lb 10.2 oz), SpO2 98.00%.  Physical Exam: General: Alert and awake, frail HEENT: marked right and left periorbital bruising and  ecchymosis CVS: ireg ireg Chest: clear to auscultation bilaterally, no wheezing, rales or rhonchi Abdomen: soft NT, ND, NBS Extremities: no edema  Lab Results: Basic Metabolic Panel:  Lab 09/25/12 8119 09/24/12 0455  NA 140 136  K 4.0 3.4*  CL 103 103  CO2 30 30  GLUCOSE 110* 102*  BUN 18 20  CREATININE 0.61 0.78  CALCIUM 9.3 8.5  MG -- --  PHOS -- --   CBC:  Lab 09/25/12 0600 09/24/12 1952 09/24/12 0455 09/20/12 0943  WBC 12.1* -- 8.8 --  NEUTROABS -- -- -- 5.4  HGB 9.0* 8.9* -- --  HCT 26.4* 25.8* -- --  MCV 90.7 -- 90.3 --  PLT 136* -- 121* --   Cardiac Enzymes:  Lab 09/21/12 0834 09/20/12 2322 09/20/12 1811  CKTOTAL -- -- --  CKMB -- -- --  CKMBINDEX -- -- --  TROPONINI <0.30 <0.30 <0.30   BNP: No components found with this basename: POCBNP:2 CBG:  Lab 09/24/12 1508  GLUCAP 123*     Micro Results: No results found for this or any previous visit (from the past 240 hour(s)).  Studies/Results: *RADIOLOGY REPORT*  Clinical Data: Loss of consciousness with trauma to the head.  MRI HEAD WITHOUT CONTRAST  Technique: Multiplanar, multiecho pulse sequences of the brain and  surrounding structures were obtained according to standard protocol  without intravenous contrast.  Comparison: CT 09/20/2012. MRI 11/15/2011  Findings: Diffusion imaging is negative for acute or subacute  infarction. There is an old infarction in the right middle  cerebral artery territory affecting the temporal lobe, insula and  frontoparietal region which has progressed atrophy,  encephalomalacia and adjacent gliosis. There is some old  hemosiderin deposition.  There is some intraventricular hemorrhage layering dependently .  There is some acute or subacute hemorrhage apparently in the  substance of the chorioid in the left lateral ventricle. This  appeared normal on the previous study and therefore I would doubt  the presence of a vascular malformation. I think there is a very    small amount of subdural hematoma on the left and on the right,  only a few millimeters in thickness. No hydrocephalus. No mass  lesion. Left facial hematoma again noted.  IMPRESSION:  No acute infarction.  Acute/subacute hemorrhage within the choroid of the left lateral  ventricle with a small amount of intraventricular blood layering  dependently. Small amount of subdural blood along both convexities  left more than right, but only a few millimeters in thickness.   Medications: Scheduled Meds:    . antiseptic oral rinse  15 mL Mouth Rinse BID  . atorvastatin  10 mg Oral q1800  . digoxin  0.125 mg Oral Daily  . diltiazem  240 mg Oral Daily  . docusate sodium  100 mg Oral BID  . feeding supplement  237 mL Oral BID BM  . levETIRAcetam  500 mg Oral BID  . levothyroxine  50 mcg Oral QAC breakfast  . mirtazapine  15 mg Oral QHS  . multivitamin with minerals  1 tablet Oral Q1200  . polycarbophil  625 mg Oral Q1200  . polyethylene glycol  17 g Oral Daily  . sodium chloride  3 mL Intravenous Q12H      LOS: 5 days   Christiane Ha M.D. [facility] 09/25/2012, 12:19 PM  If 7PM-7AM, please contact night-coverage [email or web address] Password [email or web address]

## 2012-09-25 NOTE — Progress Notes (Signed)
Physical Therapy Treatment Patient Details Name: [name] MRN: [id number] DOB: [birth year] Today's Date: 09/25/2012 Time: 4098-1191 PT Time Calculation (min): 27 min  PT Assessment / Plan / Recommendation Comments on Treatment Session  Pt admitted after fall with R hip fx s/p hemiarthroplasty and CVA. Pt with period of unresponsiveness yesterday and found to have small SDH. Pt continues to demonstrate decreased assist with mobility and lack of significant progression or effort with therapy will decrease frequency based on participation and discharge plan. Will continue to follow.     Follow Up Recommendations        Does the patient have the potential to tolerate intense rehabilitation     Barriers to Discharge        Equipment Recommendations       Recommendations for Other Services    Frequency Min 3X/week   Plan Discharge plan remains appropriate;Frequency needs to be updated    Precautions / Restrictions Precautions Precautions: Fall Precaution Comments: pt unable to recall precautions and educated for all  Required Braces or Orthoses:  (no KI present, positioned with pillow) Restrictions LLE Weight Bearing: Weight bearing as tolerated   Pertinent Vitals/Pain Pt reports generalized pain as well as surgical pain with left hip movement grossly 4/10  Pt with hypotension on arrival and after HEP BP up to 124/45 (60) EOB so only attempted EOB activity with return to supine Sats 90-92% on RA, returned to 2L O2 end of session with sats 96%    Mobility  Bed Mobility Bed Mobility: Supine to Sit;Sit to Supine;Scooting to HOB Supine to Sit: 1: +2 Total assist;HOB elevated Supine to Sit: Patient Percentage: 10% Sitting - Scoot to Edge of Bed: 1: +1 Total assist Sit to Supine: 1: +2 Total assist;HOB flat Sit to Supine: Patient Percentage: 10% Scooting to HOB: 1: +2 Total assist Scooting to [facility]: Patient Percentage: 0% Details for Bed Mobility Assistance: Pt reaching for rail  with RUE  Transfers Transfers: Sit to Stand;Stand to Sit Sit to Stand: 1: +2 Total assist;From bed;From elevated surface Sit to Stand: Patient Percentage: 20% Stand to Sit: 1: +2 Total assist;To bed Stand to Sit: Patient Percentage: 20% Details for Transfer Assistance: Attempted standing x 2 from bed with bil knees blocked, bed elevated, cueing for sequence and use of pad to elevate sacrum from surface. Pt will hold onto PT arm with her RUE and attempt to lean forward but that is the extent of her assist. Maintained standing grossly 15sec each time Ambulation/Gait Ambulation/Gait Assistance: Not tested (comment)    Exercises Total Joint Exercises Heel Slides: AAROM;Both;10 reps;Supine Hip ABduction/ADduction: AAROM;Both;10 reps;Supine   PT Diagnosis:    PT Problem List:   PT Treatment Interventions:     PT Goals Acute Rehab PT Goals Pt will go Supine/Side to Sit: with max assist PT Goal: Supine/Side to Sit - Progress: Revised due to lack of progress Pt will go Sit to Supine/Side: with max assist PT Goal: Sit to Supine/Side - Progress: Revised due to lack of progress Pt will go Sit to Stand: with max assist PT Goal: Sit to Stand - Progress: Revised due to lack of progress Pt will Transfer Bed to Chair/Chair to Bed: with +1 total assist PT Transfer Goal: Bed to Chair/Chair to Bed - Progress: Revised due to lack of progress Pt will Ambulate: 1 - 15 feet;with least restrictive assistive device;with +2 total assist PT Goal: Ambulate - Progress: Revised due to lack of progress  Visit Information  Last PT Received  On: 09/25/12 Assistance Needed: +2    Subjective Data  Subjective: I feel terrible   Cognition  Overall Cognitive Status: Impaired Area of Impairment: Memory;Problem solving Arousal/Alertness: Awake/alert Orientation Level: Disoriented to;Time;Situation Behavior During Session: Flat affect Memory: Decreased recall of precautions Problem Solving: slow to process       Balance  Static Sitting Balance Static Sitting - Balance Support: Right upper extremity supported;Feet supported Static Sitting - Level of Assistance: 4: Min assist Static Sitting - Comment/# of Minutes: 4 pt with right lean and even with cueing and assist for pressing through RUE pt would not extend elbow without physical assist to achieve midline. Maintaining neck flexion with right rotation  End of Session PT - End of Session Activity Tolerance: Patient limited by fatigue Patient left: in bed;with call bell/phone within reach;with family/visitor present   GP     Toney Sang Beth 09/25/2012, 4:03 PM Delaney Meigs, PT [phone]

## 2012-09-25 NOTE — Progress Notes (Signed)
Subjective: Patient more oriented today.  Reports that she feels better.  Head CT repeated and shows no progression of her hemorrhage.   Objective: Current vital signs: BP 97/40  Pulse 76  Temp 98.8 F (37.1 C) (Oral)  Resp 14  Ht 5\' 8"  (1.727 m)  Wt 52 kg (114 lb 10.2 oz)  BMI 17.43 kg/m2  SpO2 98% Vital signs in last 24 hours: Temp:  [97.4 F (36.3 C)-98.8 F (37.1 C)] 98.8 F (37.1 C) (01/07 1143) Pulse Rate:  [71-126] 76  (01/07 1200) Resp:  [10-22] 14  (01/07 1200) BP: (86-125)/(36-59) 97/40 mmHg (01/07 1200) SpO2:  [90 %-100 %] 98 % (01/07 1200) Weight:  [52 kg (114 lb 10.2 oz)] 52 kg (114 lb 10.2 oz) (01/07 0331)  Intake/Output from previous day: 01/06 0701 - 01/07 0700 In: 646 [Blood:646] Out: 1375 [Urine:1350; Drains:25] Intake/Output this shift: Total I/O In: 120 [P.O.:120] Out: 100 [Urine:100] Nutritional status: Dysphagia  Neurologic Exam: Mental Status:  Alert, oriented. Speech fluent without evidence of aphasia. Able to follow 3 step commands without difficulty.  Appearance: entire left face is bruised, swollen and included is the neck region. No obstruction to breathing.  Cranial Nerves:  II: visual fields grossly normal, pupils equal, round, reactive to light and accommodation  III,IV, VI: ptosis not present, extra-ocular motions intact bilaterally  V,VII: smile symmetric, facial light touch sensation normal bilaterally. She does have bruising of the left side of her head which does affect her face-moreso her peri-orbits and neck region.  VIII: hearing normal bilaterally  IX,X: gag reflex present  XI: trapezius strength/neck flexion strength normal bilaterally  XII: tongue strength normal, she does have small area on tip of left tongue that is bruised as well.  Motor:  Right : Upper extremity 5/5           Left: Upper extremity 5-/5   Lower extremity 5/5        Lower extremity 3+/5 (decreased ROM due to surgery)  Tone and bulk:normal tone throughout;  no atrophy noted, LE warm pink with palpable pulses  Sensory: Pinprick and light touch absent on left body. Rare, but it does occur, she will state she feels touch on left side, but inconsistent.  Deep Tendon Reflexes: 2+ symmetric int he upper extremities. Did not test left knee.  Plantars:  Right: downgoing      Left: downgoing  Cerebellar:  Unable to test   Lab Results: Basic Metabolic Panel:  Lab 09/25/12 8413 09/24/12 0455 09/23/12 0725 09/22/12 0255 09/21/12 0834  NA 140 136 139 138 140  K 4.0 3.4* 3.5 3.0* 3.7  CL 103 103 107 105 106  CO2 30 30 28 26 22   GLUCOSE 110* 102* 107* 106* 88  BUN 18 20 17 9 12   CREATININE 0.61 0.78 0.70 0.60 0.61  CALCIUM 9.3 8.5 8.6 -- --  MG -- -- -- -- --  PHOS -- -- -- -- --    Liver Function Tests: No results found for this basename: AST:5,ALT:5,ALKPHOS:5,BILITOT:5,PROT:5,ALBUMIN:5 in the last 168 hours No results found for this basename: LIPASE:5,AMYLASE:5 in the last 168 hours No results found for this basename: AMMONIA:3 in the last 168 hours  CBC:  Lab 09/25/12 0600 09/24/12 1952 09/24/12 0455 09/23/12 0725 09/22/12 0255 09/21/12 0834 09/20/12 0943  WBC 12.1* -- 8.8 9.8 9.0 9.3 --  NEUTROABS -- -- -- -- -- -- 5.4  HGB 9.0* 8.9* 7.2* 8.4* 11.0* -- --  HCT 26.4* 25.8* 21.5* 24.2* 32.3* -- --  MCV 90.7 -- 90.3 89.3 89.7 90.3 --  PLT 136* -- 121* 125* 127* 121* --    Cardiac Enzymes:  Lab 09/21/12 0834 09/20/12 2322 09/20/12 1811  CKTOTAL -- -- --  CKMB -- -- --  CKMBINDEX -- -- --  TROPONINI <0.30 <0.30 <0.30    Lipid Panel: No results found for this basename: CHOL:5,TRIG:5,HDL:5,CHOLHDL:5,VLDL:5,LDLCALC:5 in the last 168 hours  CBG:  Lab 09-30-12 1508  GLUCAP 123*    Microbiology: Results for orders placed during the hospital encounter of 05/27/11  MRSA PCR SCREENING     Status: Normal   Collection Time   05/27/11  4:30 PM      Component Value Range Status Comment   MRSA by PCR NEGATIVE  NEGATIVE Final   URINE  CULTURE     Status: Normal   Collection Time   05/28/11 11:52 PM      Component Value Range Status Comment   Specimen Description OB CATHETERIZED   Final    Special Requests NONE   Final    Culture  Setup Time [id number]   Final    Colony Count >=100,000 COLONIES/ML   Final    Culture STREPTOCOCCUS GROUP D;high probability for S.bovis   Final    Report Status 05/31/2011 FINAL   Final    Organism ID, Bacteria STREPTOCOCCUS GROUP D;high probability for S.bovis   Final   URINE CULTURE     Status: Normal   Collection Time   05/31/11 11:22 AM      Component Value Range Status Comment   Specimen Description URINE, CLEAN CATCH   Final    Special Requests NONE   Final    Culture  Setup Time [id number]   Final    Colony Count >=100,000 COLONIES/ML   Final    Culture ESCHERICHIA COLI   Final    Report Status 06/02/2011 FINAL   Final    Organism ID, Bacteria ESCHERICHIA COLI   Final   URINE CULTURE     Status: Normal   Collection Time   06/10/11  3:55 AM      Component Value Range Status Comment   Specimen Description URINE, CLEAN CATCH   Final    Special Requests IMMUNE:NORM UT SYMPT:NEG   Final    Culture  Setup Time [id number]   Final    Colony Count 10,000 COLONIES/ML   Final    Culture     Final    Value: Multiple bacterial morphotypes present, none predominant. Suggest appropriate recollection if clinically indicated.   Report Status 06/11/2011 FINAL   Final     Coagulation Studies:  Basename 09/25/12 0600 September 30, 2012 0455 09/23/12 0725  LABPROT 13.7 28.8* 27.2*  INR 1.06 2.90* 2.68*    Imaging: Ct Head Wo Contrast  09/25/2012  *RADIOLOGY REPORT*  Clinical Data: Stroke.  Headache and dizziness.  Follow up intracranial hemorrhage  CT HEAD WITHOUT CONTRAST  Technique:  Contiguous axial images were obtained from the base of the skull through the vertex without contrast.  Comparison: CT head 09/20/2012, MRI 09/30/2012  Findings: Intraventricular hemorrhage layering in the occipital  horns is similar to the recent MRI and is difficult to see on the CT 09/20/2012.  Small subdural hemorrhage   noted on the MRI is difficult to see on the current CT scan.  Chronic right MCA infarct is unchanged.  Chronic ischemia in the right internal capsule is unchanged.  Negative for acute infarct. Negative for mass lesion.  Negative for skull fracture.  IMPRESSION: Mild  intraventricular hemorrhage is unchanged from 09/24/2012.  No hydrocephalus.  Chronic right MCA infarct unchanged.  No acute infarct.   Original Report Authenticated By: Janeece Riggers, M.D.    Mr Brain Wo Contrast  09/24/2012  *RADIOLOGY REPORT*  Clinical Data: Loss of consciousness with trauma to the head.  MRI HEAD WITHOUT CONTRAST  Technique:  Multiplanar, multiecho pulse sequences of the brain and surrounding structures were obtained according to standard protocol without intravenous contrast.  Comparison: CT 09/20/2012.  MRI 11/15/2011  Findings: Diffusion imaging is negative for acute or subacute infarction.  There is an old infarction in the right middle cerebral artery territory affecting the temporal lobe, insula and frontoparietal region which has progressed atrophy, encephalomalacia and adjacent gliosis.  There is some old hemosiderin deposition.  There is some intraventricular hemorrhage layering dependently . There is some acute or subacute hemorrhage apparently in the substance of the chorioid in the left lateral ventricle.  This appeared normal on the previous study and therefore I would doubt the presence of a vascular malformation.  I think there is a very small amount of subdural hematoma on the left and on the right, only a few millimeters in thickness.  No hydrocephalus.  No mass lesion.  Left facial hematoma again noted.  IMPRESSION: No acute infarction.  Acute/subacute hemorrhage within the choroid of the left lateral ventricle with a small amount of intraventricular blood layering dependently.  Small amount of subdural  blood along both convexities left more than right, but only a few millimeters in thickness.   Original Report Authenticated By: Paulina Fusi, M.D.     Medications:  I have reviewed the patient's current medications. Scheduled:   . antiseptic oral rinse  15 mL Mouth Rinse BID  . atorvastatin  10 mg Oral q1800  . digoxin  0.125 mg Oral Daily  . diltiazem  240 mg Oral Daily  . feeding supplement  237 mL Oral BID BM  . levETIRAcetam  500 mg Oral BID  . levothyroxine  50 mcg Oral QAC breakfast  . mirtazapine  15 mg Oral QHS  . multivitamin with minerals  1 tablet Oral Q1200  . polycarbophil  625 mg Oral Q1200  . polyethylene glycol  17 g Oral Daily  . senna-docusate  2 tablet Oral Daily  . sodium chloride  3 mL Intravenous Q12H    Assessment/Plan:  Patient Active Hospital Problem List:    IVH (intraventricular hemorrhage) (09/24/2012)   Assessment: Stable hemorrhage.  Mental status improved.     Plan: No further neurologic intervention is recommended at this time.  If further questions arise, please call or page at that time.  Thank you for allowing neurology to participate in the care of this patient.   LOS: 5 days   Thana Farr, MD [facility] [phone] 09/25/2012  3:46 PM

## 2012-09-25 NOTE — Progress Notes (Signed)
Subjective: 4 Days Post-Op Procedure(s) (LRB): ARTHROPLASTY BIPOLAR HIP (Left) Patient reports pain as mild in her hip, but complaining of burning and pain with swallowing this am while eating breakfast.  History of dysphagia Events of CVA and warfarin reversal noted.   Objective: Vital signs in last 24 hours: Temp:  [97.4 F (36.3 C)-98.9 F (37.2 C)] 98.4 F (36.9 C) (01/07 0804) Pulse Rate:  [71-126] 82  (01/07 0937) Resp:  [10-18] 14  (01/07 0804) BP: (86-125)/(37-60) 114/46 mmHg (01/07 0804) SpO2:  [90 %-100 %] 100 % (01/07 0804) Weight:  [52 kg (114 lb 10.2 oz)] 52 kg (114 lb 10.2 oz) (01/07 0331)  Intake/Output from previous day: 01/06 0701 - 01/07 0700 In: 646 [Blood:646] Out: 1375 [Urine:1350; Drains:25] Intake/Output this shift: Total I/O In: -  Out: 50 [Urine:50]   Basename 09/25/12 0600 09/24/12 1952 09/24/12 0455 09/23/12 0725  HGB 9.0* 8.9* 7.2* 8.4*    Basename 09/25/12 0600 09/24/12 1952 09/24/12 0455  WBC 12.1* -- 8.8  RBC 2.91* -- 2.38*  HCT 26.4* 25.8* --  PLT 136* -- 121*    Basename 09/25/12 0600 09/24/12 0455  NA 140 136  K 4.0 3.4*  CL 103 103  CO2 30 30  BUN 18 20  CREATININE 0.61 0.78  GLUCOSE 110* 102*  CALCIUM 9.3 8.5    Basename 09/25/12 0600 09/24/12 0455  LABPT -- --  INR 1.06 2.90*    Neurovascular intact in LEs  Assessment/Plan: 4 Days Post-Op Procedure(s) (LRB): ARTHROPLASTY BIPOLAR HIP (Left) Up with therapy when medically stable to resume PT Anemia improved.  Poss. Need for GI eval for poss bleed.  VERNON,SHEILA M 09/25/2012, 9:41 AM  Hemovac drain left thigh removed today. Dressing was intact with dry blood, no erythrema or purulence noted. New dressing at drain site. Reviewed CT of Brain from today, formal reading not available as yet but the study suggests no worsening of the small subdural hematoma right frontal and small intraventricular bleed.

## 2012-09-25 NOTE — Progress Notes (Signed)
Speech Language Pathology Dysphagia Treatment Patient Details Name: [name] MRN: [id number] DOB: [birth year] Today's Date: 09/25/2012 Time: 8295-6213 SLP Time Calculation (min): 21 min  Assessment / Plan / Recommendation Clinical Impression  Treatment session focused on diet tolerance, downgraded textures to reduce signs of aspiration. Pt with consistent throat clearing with sips of thin liquids, one episode of cough. Nectar thick liquids with similar results. Pt states she is supposed to clear throat but response appears more reflexive than intentional. Pt also appears weaker today, staff reports limited intake. Suggest MBS tomorrow for objective evaluation of swallow given now consistent signs of decreased airway protection with functional decline. Pt may continue current diet with full supervision, discussed with RN, precautions posted. Will /fu in am for MBS.     Diet Recommendation  Continue with Current Diet: Dysphagia 3 (mechanical soft);Thin liquid    SLP Plan MBS   Pertinent Vitals/Pain NA   Swallowing Goals  SLP Swallowing Goals Patient will utilize recommended strategies during swallow to increase swallowing safety with: Supervision/safety  General Temperature Spikes Noted: No Respiratory Status: Supplemental O2 delivered via (comment) Behavior/Cognition: Cooperative;Lethargic;Requires cueing Oral Cavity - Dentition: Adequate natural dentition Patient Positioning: Upright in bed  Oral Cavity - Oral Hygiene Does patient have any of the following "at risk" factors?: Oxygen therapy - cannula, mask, simple oxygen devices Patient is AT RISK - Oral Care Protocol followed (see row info): Yes   Dysphagia Treatment Treatment focused on: Skilled observation of diet tolerance;Facilitation of pharyngeal phase Treatment Methods/Modalities: Skilled observation Patient observed directly with PO's: Yes Type of PO's observed: Dysphagia 3 (soft);Thin liquids;Nectar-thick  liquids Feeding: Needs assist Liquids provided via: Cup Oral Phase Signs & Symptoms: Prolonged mastication;Left pocketing;Prolonged bolus formation;Prolonged oral phase;Anterior loss/spillage Pharyngeal Phase Signs & Symptoms: Suspected delayed swallow initiation;Immediate throat clear;Audible swallow;Multiple swallows;Immediate cough Type of cueing: Verbal;Tactile Amount of cueing: Minimal   GO   Harlon Ditty, MA CCC-SLP [phone]  Claudine Mouton 09/25/2012, 2:54 PM

## 2012-09-25 NOTE — Progress Notes (Signed)
Rehab admissions - Evaluated for possible admission.  Noted PT and OT recommending SNF.  Rehab consult done and Dr. Riley Kill also recommending SNF.  Likely patient will need SNF once medically ready.  Call me for questions.  #[phone]

## 2012-09-25 NOTE — Progress Notes (Signed)
SUBJECTIVE:  Episode of unresponsiveness.  Rhythm was flutter with rapid rate.    Found to have acute intraventricular hemorrhage on imaging.  This morning she denies any pain.  She is not having SOB.   PHYSICAL EXAM Filed Vitals:   09/25/12 0200 09/25/12 0331 09/25/12 0500 09/25/12 0600  BP: 108/44 102/42 101/43 93/42  Pulse: 83 82 81 80  Temp:  98.2 F (36.8 C)    TempSrc:  Oral    Resp: 13 15 11 12   Height:      Weight:  114 lb 10.2 oz (52 kg)    SpO2: 98% 94% 93% 90%   General:  Very frail.  No distress Lungs:  Clear Heart:  Irregular Abdomen:  Positive bowel sounds, no rebound no guarding Extremities:  Trace edema  LABS:  Results for orders placed during the hospital encounter of 09/20/12 (from the past 24 hour(s))  TYPE AND SCREEN     Status: Normal (Preliminary result)   Collection Time   09/24/12 11:40 AM      Component Value Range   ABO/RH(D) O POS     Antibody Screen NEG     Sample Expiration 09/27/2012     Unit Number [id number]     Blood Component Type RED CELLS,LR     Unit division 00     Status of Unit ISSUED     Transfusion Status OK TO TRANSFUSE     Crossmatch Result Compatible     Unit Number [id number]     Blood Component Type RBC LR PHER1     Unit division 00     Status of Unit ALLOCATED     Transfusion Status OK TO TRANSFUSE     Crossmatch Result Compatible    PREPARE RBC (CROSSMATCH)     Status: Normal   Collection Time   09/24/12 11:40 AM      Component Value Range   Order Confirmation ORDER PROCESSED BY BLOOD BANK    PREPARE FRESH FROZEN PLASMA     Status: Normal (Preliminary result)   Collection Time   09/24/12  2:58 PM      Component Value Range   Unit Number [id number]     Blood Component Type THAWED PLASMA     Unit division 00     Status of Unit ISSUED     Transfusion Status OK TO TRANSFUSE     Unit Number [id number]     Blood Component Type THAWED PLASMA     Unit division 00     Status of Unit ISSUED     Transfusion  Status OK TO TRANSFUSE    GLUCOSE, CAPILLARY     Status: Abnormal   Collection Time   09/24/12  3:08 PM      Component Value Range   Glucose-Capillary 123 (*) 70 - 99 mg/dL   Comment 1 Notify RN     Comment 2 Documented in Chart    HEMOGLOBIN AND HEMATOCRIT, BLOOD     Status: Abnormal   Collection Time   09/24/12  7:52 PM      Component Value Range   Hemoglobin 8.9 (*) 12.0 - 15.0 g/dL   HCT 72.5 (*) 36.6 - 44.0 %  CBC     Status: Abnormal   Collection Time   09/25/12  6:00 AM      Component Value Range   WBC 12.1 (*) 4.0 - 10.5 K/uL   RBC 2.91 (*) 3.87 - 5.11 MIL/uL   Hemoglobin 9.0 (*) 12.0 -  15.0 g/dL   HCT 91.4 (*) 78.2 - 95.6 %   MCV 90.7  78.0 - 100.0 fL   MCH 30.9  26.0 - 34.0 pg   MCHC 34.1  30.0 - 36.0 g/dL   RDW 21.3  08.6 - 57.8 %   Platelets 136 (*) 150 - 400 K/uL    Intake/Output Summary (Last 24 hours) at 09/25/12 0658 Last data filed at 09/25/12 0606  Gross per 24 hour  Intake    646 ml  Output   1375 ml  Net   -729 ml    ASSESSMENT AND PLAN:  Hip fracture:  Plan per ortho, PT.  She will need rehab.  Atrial fibrillation:  Rate is controlled.  On dig (I will follow a level) and Cardizem.  Continue current meds.   IVH (intraventricular hemorrhage):  CNS bleed while on therapeutic warfarin.  Warfarin reversed.  INR now 1.06  HTN (hypertension):  BP running low in the context of her acute illness.     Fayrene Fearing [facility] 09/25/2012 6:58 AM

## 2012-09-26 ENCOUNTER — Inpatient Hospital Stay (HOSPITAL_COMMUNITY): Payer: Medicare Other

## 2012-09-26 LAB — CBC
Platelets: 150 10*3/uL (ref 150–400)
RBC: 2.72 MIL/uL — ABNORMAL LOW (ref 3.87–5.11)
WBC: 11.8 10*3/uL — ABNORMAL HIGH (ref 4.0–10.5)

## 2012-09-26 MED ORDER — MORPHINE SULFATE 2 MG/ML IJ SOLN: 1.0000 mg | INTRAMUSCULAR | Status: DC | PRN

## 2012-09-26 MED ORDER — RESOURCE THICKENUP CLEAR PO POWD
ORAL | Status: DC | PRN
  Filled 2012-09-26: qty 125

## 2012-09-26 MED ORDER — SODIUM CHLORIDE 0.9 % IV SOLN
INTRAVENOUS | Status: DC
  Administered 2012-09-26 (×3): via INTRAVENOUS
  Administered 2012-09-27: 50 mL/h via INTRAVENOUS
  Administered 2012-09-27: 100 mL/h via INTRAVENOUS

## 2012-09-26 MED ORDER — SODIUM CHLORIDE 0.9 % IV BOLUS (SEPSIS)
250.0000 mL | Freq: Once | INTRAVENOUS | Status: AC
  Administered 2012-09-26: 250 mL via INTRAVENOUS

## 2012-09-26 NOTE — Progress Notes (Addendum)
[facility] Progress Note [place] TEAM 1 - Stepdown/ICU TEAM   TINA GRUNER AVW:[id number] DOB: [birth year] DOA: 09/20/2012 PCP: [name], MD  Brief narrative: 79 y.o. female who was admitted on 09/20/2012 after standing at the sink and turning quickly then subsequently falling to the floor with possible syncope.  Her husband was present, and denied seeing any seizure-like activity. She sustained left hip fracture for which she had surgery on 09/21/2012 after reversal of her coumadin (for atrial fibrillation).  On 09/24/2012 she was at a bedside commode, had an episode of unresponsiveness, respirations slowed, heart rate increased to 160s, SBP 96. Code started, but patient returned to baseline once back to bed, < 1 minute. Of note, she had taken 2 vicodin about 1 hour prior to this event. Since patient had trauma 4 days ago and had episode of unresponsiveness, she was taken to MRI which showed acute/subacute hemorrhage within the choroid of the L lateral ventricle with a small amount of intraventricular blood layering dependently as well as a small amount of subdural blood along both convexities L>R, but only a few millimeters in thickness.   Assessment/Plan:  intraventricular hemorrhage/subdural hematoma 09/24/2012 (not present on CT head at admit 09/20/2012) Per Neuro "based on imaging hemorrhage likely spontaneous or possibly late and related to previous injury" - Coumadin reversed - repeat CT brain 09/25/2012 confirmed stability of bleed - Per Dr. [name], patient may be up with physical therapy - holding further anticoagulation until cleared for same by Neuro  Hip fracture s/p L Bipolar arthroplasty  TED hose, SCDs - no anticoagulation secondary to above - Ortho following   Urinary retention Patient required Foley catheter replacement 09/25/2012 - post Foley insertion returned greater than a liter of urine collected - would not discontinue current foley until more mobile and able to  participate in voiding trial   Atrial fibrillation/flutter w/ RVR Rate controlled presntly - Cardiology has seen - no anticoagulation at this time as stated above  Syncope  Cont tele - out-patient event monitor to be arranged at time of d/c per Cardiology (Dr. Jens Som)  History of CVA (cerebrovascular accident)  R MCA CVA >> received tPA (05/2011) - left her with left sided weakness  Anemia postop  Follow Hgb trend - guaiac stools when pt has a BM - no obvious blood loss w/ exception to extensive bruising  Fall PT evaluation recommending skilled nursing facility - Husband very resistant to this - cont PT/OT and re-assess as pt continues to improve  Hypothyroidism cont synthroid - TSH at goal  HTN BP marginal - pt with little intake - encouraged diet - follow trend - may require short return to IVF support   Seizures cont keppra   Hypokalemia replaced   Constipation Scheduled Senokot and MiraLAX  Code Status: FULL Family Communication: spoke w/ pt and husband at bedside  Disposition Plan: SNF vs/ CIR - plan for transfer to tele bed 09/27/12 if remains stable  Consultants: Neuro Ortho Cardiology  Antibiotics: none  DVT prophylaxis: SCDs only   HPI/Subjective: The patient is alert and responsive.  She reports that she feels better today than she has "for quite sometimes".  She denies chest pain shortness of breath fevers or chills.  She does report the expected pain in her hip as well as in her face.   Objective: Blood pressure 115/50, pulse 94, temperature 98.3 F (36.8 C), temperature source Oral, resp. rate 20, height 5\' 8"  (1.727 m), weight 55.6 kg (122 lb 9.2 oz),  SpO2 97.00%.  Intake/Output Summary (Last 24 hours) at 09/26/12 1332 Last data filed at 09/26/12 1212  Gross per 24 hour  Intake    623 ml  Output    550 ml  Net     73 ml     Exam: General: No acute respiratory distress - extensive bruising about the face greatest on left with ping pong ball  sized hematoma beneath the left eye without evidence of extending erythema or calor Lungs: Clear to auscultation bilaterally without wheezes or crackles Cardiovascular: Regular rate without appreciable gallop or murmur Abdomen: Nontender, nondistended, soft, bowel sounds positive, no rebound, no ascites, no appreciable mass Extremities: No significant cyanosis, clubbing, or edema bilateral lower extremities  Data Reviewed: Basic Metabolic Panel:  Lab 09/25/12 1610 09/24/12 0455 09/23/12 0725 09/22/12 0255 09/21/12 0834  NA 140 136 139 138 140  K 4.0 3.4* 3.5 3.0* 3.7  CL 103 103 107 105 106  CO2 30 30 28 26 22   GLUCOSE 110* 102* 107* 106* 88  BUN 18 20 17 9 12   CREATININE 0.61 0.78 0.70 0.60 0.61  CALCIUM 9.3 8.5 8.6 8.6 9.1  MG -- -- -- -- --  PHOS -- -- -- -- --   CBC:  Lab 09/26/12 0435 09/25/12 0600 09/24/12 1952 09/24/12 0455 09/23/12 0725 09/22/12 0255 09/20/12 0943  WBC 11.8* 12.1* -- 8.8 9.8 9.0 --  NEUTROABS -- -- -- -- -- -- 5.4  HGB 8.4* 9.0* 8.9* 7.2* 8.4* -- --  HCT 24.6* 26.4* 25.8* 21.5* 24.2* -- --  MCV 90.4 90.7 -- 90.3 89.3 89.7 --  PLT 150 136* -- 121* 125* 127* --   Cardiac Enzymes:  Lab 09/21/12 0834 09/20/12 2322 09/20/12 1811  CKTOTAL -- -- --  CKMB -- -- --  CKMBINDEX -- -- --  TROPONINI <0.30 <0.30 <0.30   CBG:  Lab 09/24/12 1508  GLUCAP 123*    Studies:  Recent x-ray studies have been reviewed in detail by the Attending Physician  Scheduled Meds:  Reviewed in detail by the Attending Physician   Lonia Blood, MD [facility] Office  [phone] Pager [phone]  On-Call/Text Page:      [email or web address]      password [email or web address]  If 7PM-7AM, please contact night-coverage [email or web address] Password [email or web address] 09/26/2012, 1:32 PM   LOS: 6 days

## 2012-09-26 NOTE — Progress Notes (Signed)
UOP is only 150cc for 12 hours. K.Schorr, NP notified. New orders received.  M.Foster Simpson, RN

## 2012-09-26 NOTE — Procedures (Signed)
Objective Swallowing Evaluation: Modified Barium Swallowing Study  Patient Details  Name: [name] MRN: [id number] Date of Birth: 1930-11-05  Today's Date: 09/26/2012 Time: 4098-1191 SLP Time Calculation (min): 33 min  Past Medical History:  Past Medical History  Diagnosis Date  . Dysphagia   . Atrial fibrillation     a. Dx 05/2011 at time of CVA.  Marland Kitchen Hyperlipidemia   . HTN (hypertension)   . Hypothyroidism   . Pneumonia     "alot of pneumonia" (09/21/2012)  . Syncope and collapse     "fell @ [place]; hit left forehead"; left hip fx/notes 09/20/2012  . Vertigo   . Arthritis     "some; all over" (09/22/2011)  . Anxiety   . Depression   . Seizures      a. 05/2011 right MCA CVA in setting of new onset afib s/p thrombolysis/thrombolytics. b. Recurrent CVA 10/2011 in setting of subtherap.INR.    Marland Kitchen Seizure 10/2011    "only one she ever had was 3 months after her stroke" (09/21/2012  . Stroke 05/2011    a. 05/2011 right MCA CVA in setting of new onset afib s/p thrombolysis/thrombolytics. b. Recurrent CVA 10/2011 in setting of subtherap.INR.  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 Lumbar disc surgery 1981  . Vaginal hysterectomy 1982  . Pleural scarification 1977?; 1986    "2 lung surgeries; my lungs had collapsed" (09/21/2012)  . Tonsillectomy and adenoidectomy 1950?  Marland Kitchen Appendectomy 1982  . Thrombolysis 05/2011    [name] 09/20/2012  . Hip arthroplasty 09/21/2012    Procedure: ARTHROPLASTY BIPOLAR HIP;  Surgeon: Kerrin Champagne, MD;  Location: [facility] OR;  Service: Orthopedics;  Laterality: Left;   HPI:  [name] is a 79 y.o. female, with history of atrial fibrillation and CVA in September of last year, who is on Coumadin, history of seizures, was in usual state of health however this morning while admitting in her house she had a sensation of warmth and flushing  following which she passed out and fell hitting her left hip, of and left forehead to the floor, she sustained a laceration around her left forehead, started experiencing pain around her left elbow and left hip. Pt underwent left hip hemiarthroplasty on 09/22/11 with brief ETT. PMH: large old right CVA, acute right frontal/temporal infarct 2/13, dysphagia with 3 MBS' between 05/24/11-06/08/11. MBS 06/08/11 revealed flash penetration with thin liquids with recommendation of DYs 2 thin liquids, double swallows and intermittent throat clear.      Assessment / Plan / Recommendation Clinical Impression  Dysphagia Diagnosis: Moderate cervical esophageal phase dysphagia;Moderate pharyngeal phase dysphagia;Mild oral phase dysphagia Clinical impression: Pt presents with a moderate baseline dysphagia that has worsened due to generalized weakness and decompensation from recent fall. The pt continues to show a mild oral dysphagia with holding and piecemeal transit (no pocketing despite left oral weakness). Hyolaryngeal elevation and pharyngeal contraction are mildy worsened likely from generalized weakness. Pharyngeal residuals post swallow are moderate to severe, primarily due to limited opening of cervical esophagus (present at baseline due to curvature of cervical spine). Thin liquids are aspirated with a delayed cough response, nectar thick liquids did not result in any aspiration, though 3-6 swallows are needed to adequately transit bolus. Pts posture also very important given curvature of spine. Pt will have best function if sitting up in chair with neutral head  posture (with pillows and towels). Recommend downgrade to dys 2 (fine chop) with nectar thick liquids until pt demonstrates improved strength and endurance.     Treatment Recommendation  Therapy as outlined in treatment plan below    Diet Recommendation Dysphagia 2 (Fine chop);Thin liquid   Liquid Administration via: Cup;Straw Medication Administration:  Crushed with puree Supervision: Full supervision/cueing for compensatory strategies Compensations: Slow rate;Small sips/bites;Multiple dry swallows after each bite/sip;Follow solids with liquid (3-6 swallows) Postural Changes and/or Swallow Maneuvers: Out of bed for meals;Seated upright 90 degrees (support head with pillows - neutral posture. )    Other  Recommendations Oral Care Recommendations: Oral care BID Other Recommendations: Order thickener from pharmacy   Follow Up Recommendations  Inpatient Rehab    Frequency and Duration min 2x/week  2 weeks   NA    SLP Swallow Goals Goal #3: Pt will reduce signs of aspiration with minimal assistance and cueing for compensatory strategies and appropriate posture.    General HPI: [name] is a 79 y.o. female, with history of atrial fibrillation and CVA in September of last year, who is on Coumadin, history of seizures, was in usual state of health however this morning while admitting in her house she had a sensation of warmth and flushing following which she passed out and fell hitting her left hip, of and left forehead to the floor, she sustained a laceration around her left forehead, started experiencing pain around her left elbow and left hip. Pt underwent left hip hemiarthroplasty on 09/22/11 with brief ETT. PMH: large old right CVA, acute right frontal/temporal infarct 2/13, dysphagia with 3 MBS' between 05/24/11-06/08/11. MBS 06/08/11 revealed flash penetration with thin liquids with recommendation of DYs 2 thin liquids, double swallows and intermittent throat clear.  Type of Study: Modified Barium Swallowing Study Reason for Referral: Objectively evaluate swallowing function Previous Swallow Assessment: see HPI Diet Prior to this Study: Dysphagia 3 (soft);Thin liquids Temperature Spikes Noted: No Respiratory Status: Supplemental O2 delivered via (comment) History of Recent Intubation: Yes Length of Intubations (days): 1 days Date  extubated: 09/21/12 Behavior/Cognition: Cooperative;Lethargic;Requires cueing Oral Cavity - Dentition: Adequate natural dentition Oral Motor / Sensory Function: Impaired - see Bedside swallow eval Self-Feeding Abilities: Needs assist;Able to feed self Patient Positioning: Upright in chair Baseline Vocal Quality: Wet Volitional Cough: Strong Volitional Swallow: Able to elicit Anatomy: Other (Comment) (appearance Kyphotic bend in C spine impeding opening of UES)    Reason for Referral Objectively evaluate swallowing function   Oral Phase Oral Preparation/Oral Phase Oral Phase: Impaired Oral - Nectar Oral - Nectar Cup: Reduced posterior propulsion;Holding of bolus;Piecemeal swallowing;Delayed oral transit Oral - Nectar Straw: Reduced posterior propulsion;Holding of bolus;Piecemeal swallowing;Delayed oral transit Oral - Thin Oral - Thin Cup: Reduced posterior propulsion;Holding of bolus;Piecemeal swallowing;Delayed oral transit Oral - Solids Oral - Puree: Reduced posterior propulsion;Holding of bolus;Piecemeal swallowing;Delayed oral transit Oral - Regular: Reduced posterior propulsion;Holding of bolus;Piecemeal swallowing;Delayed oral transit   Pharyngeal Phase Pharyngeal Phase Pharyngeal Phase: Impaired Pharyngeal - Nectar Pharyngeal - Nectar Cup: Reduced pharyngeal peristalsis;Reduced tongue base retraction;Reduced laryngeal elevation;Reduced anterior laryngeal mobility;Pharyngeal residue - valleculae;Pharyngeal residue - pyriform sinuses Pharyngeal - Nectar Straw: Reduced pharyngeal peristalsis;Reduced tongue base retraction;Reduced laryngeal elevation;Reduced anterior laryngeal mobility;Pharyngeal residue - valleculae;Pharyngeal residue - pyriform sinuses Pharyngeal - Thin Pharyngeal - Thin Cup: Reduced pharyngeal peristalsis;Reduced tongue base retraction;Reduced laryngeal elevation;Reduced anterior laryngeal mobility;Pharyngeal residue - valleculae;Pharyngeal residue - pyriform  sinuses;Penetration/Aspiration after swallow;Trace aspiration;Penetration/Aspiration before swallow Penetration/Aspiration details (thin cup): Material enters airway, passes BELOW cords then ejected  out;Material enters airway, CONTACTS cords then ejected out;Material enters airway, remains ABOVE vocal cords and not ejected out Pharyngeal - Solids Pharyngeal - Puree: Reduced pharyngeal peristalsis;Reduced tongue base retraction;Reduced laryngeal elevation;Reduced anterior laryngeal mobility;Pharyngeal residue - valleculae;Pharyngeal residue - pyriform sinuses  Cervical Esophageal Phase    GO    Cervical Esophageal Phase Cervical Esophageal Phase: Impaired Cervical Esophageal Phase - Comment Cervical Esophageal Comment: Appearance of kyphotic bend in cervical spine reduing opening of UES with severe residuals remain post swallow in pharynx despite only mild weakness         Harlon Ditty, MA CCC-SLP [phone]  Claudine Mouton 09/26/2012, 9:35 AM

## 2012-09-26 NOTE — Progress Notes (Signed)
   SUBJECTIVE:  She is slightly confused but she denies chest pain or SOB.    PHYSICAL EXAM Filed Vitals:   09/26/12 1600 09/26/12 1612 09/26/12 1949 09/26/12 1950  BP:  107/45 112/51   Pulse:  81 96   Temp:  100.4 F (38 C) 99.7 F (37.6 C)   TempSrc:  Oral Oral   Resp: 20 20 21 22   Height:      Weight:      SpO2: 95% 95% 95% 94%   General:  Very frail.  No distress Lungs:  Clear Heart:  Irregular Abdomen:  Positive bowel sounds, no rebound no guarding Extremities:  Trace edema  LABS:  Results for orders placed during the hospital encounter of 09/20/12 (from the past 24 hour(s))  CBC     Status: Abnormal   Collection Time   09/26/12  4:35 AM      Component Value Range   WBC 11.8 (*) 4.0 - 10.5 K/uL   RBC 2.72 (*) 3.87 - 5.11 MIL/uL   Hemoglobin 8.4 (*) 12.0 - 15.0 g/dL   HCT 16.1 (*) 09.6 - 04.5 %   MCV 90.4  78.0 - 100.0 fL   MCH 30.9  26.0 - 34.0 pg   MCHC 34.1  30.0 - 36.0 g/dL   RDW 40.9  81.1 - 91.4 %   Platelets 150  150 - 400 K/uL    Intake/Output Summary (Last 24 hours) at 09/26/12 2131 Last data filed at 09/26/12 1900  Gross per 24 hour  Intake   2162 ml  Output    550 ml  Net   1612 ml    ASSESSMENT AND PLAN:  Hip fracture:  Plan per ortho, PT, rehab.    Atrial fibrillation:  Rate is controlled.  On dig (I will follow a level) and Cardizem.  Continue current meds.   Holding anticoagulation.    IVH (intraventricular hemorrhage): No evidence of bleed extension.  Neuro has signed off.    HTN (hypertension):  BP running low but stable.   Rollene Rotunda 09/26/2012 9:31 PM

## 2012-09-27 LAB — CBC
HCT: 24.2 % — ABNORMAL LOW (ref 36.0–46.0)
Hemoglobin: 8.3 g/dL — ABNORMAL LOW (ref 12.0–15.0)
WBC: 9.1 10*3/uL (ref 4.0–10.5)

## 2012-09-27 LAB — BASIC METABOLIC PANEL
BUN: 17 mg/dL (ref 6–23)
Chloride: 105 mEq/L (ref 96–112)
GFR calc Af Amer: >90 mL/min (ref >90)
Glucose, Bld: 98 mg/dL (ref 70–99)
Potassium: 3.9 mEq/L (ref 3.5–5.1)

## 2012-09-27 LAB — OCCULT BLOOD X 1 CARD TO LAB, STOOL: Fecal Occult Bld: NEGATIVE

## 2012-09-27 MED ORDER — BISACODYL 10 MG RE SUPP
10.0000 mg | Freq: Once | RECTAL | Status: AC
  Administered 2012-09-27: 10 mg via RECTAL
  Filled 2012-09-27: qty 1

## 2012-09-27 NOTE — Progress Notes (Signed)
Clinical Social Work  CSW attempted to meet with patient at bedside. Patient out of room for procedure. CSW left bed offers in room .CSW called husband on cell and house phone. CSW left a message with CSW contact information. CSW will continue to follow.  [place], [place] [phone]

## 2012-09-27 NOTE — Progress Notes (Signed)
NUTRITION FOLLOW UP  Intervention:    Continue Ensure Complete twice daily (350 kcals, 13 gm protein per 8 fl oz bottle) RD to follow for nutrition care plan  Nutrition Dx:   Inadequate oral intake related to poor appetite as evidenced by PO intake 10-50%, ongoing  Goal:   Oral intake with meals & supplements to meet >/= 90% of estimated nutrition needs, unmet  Monitor:   PO & supplemental intake, weight, labs, I/O's  Assessment:   Patient s/p bedside swallow evaluation 1/6, MBSS 1/8.  Patient presented with moderate cervical esophageal phase, moderate pharyngeal and mild oral phase dysphasia.  PO intake variable at 10-50% per flowsheet records.  + constipation ---> Dulcolox suppository for today.  Receiving Ensure Complete between meals.  MVI daily.  Height: Ht Readings from Last 1 Encounters:  09/24/12 5\' 8"  (1.727 m)    Weight Status:   Wt Readings from Last 1 Encounters:  09/27/12 123 lb 7.3 oz (56 kg)    Re-estimated needs:  Kcal: 1400-1600 Protein: 65-75 gm Fluid: > 1.5 L  Skin: laceration to head  Diet Order: Dysphagia 2, nectar thick liquids   Intake/Output Summary (Last 24 hours) at 09/27/12 1202 Last data filed at 09/27/12 0805  Gross per 24 hour  Intake   2472 ml  Output   1350 ml  Net   1122 ml    Labs:   Lab 09/27/12 0605 09/25/12 0600 09/24/12 0455  NA 138 140 136  K 3.9 4.0 3.4*  CL 105 103 103  CO2 24 30 30   BUN 17 18 20   CREATININE 0.52 0.61 0.78  CALCIUM 8.6 9.3 8.5  MG -- -- --  PHOS -- -- --  GLUCOSE 98 110* 102*    CBG (last 3)   Basename 09/24/12 1508  GLUCAP 123*    Scheduled Meds:   . antiseptic oral rinse  15 mL Mouth Rinse BID  . atorvastatin  10 mg Oral q1800  . bisacodyl  10 mg Rectal Once  . digoxin  0.125 mg Oral Daily  . diltiazem  240 mg Oral Daily  . feeding supplement  237 mL Oral BID BM  . levETIRAcetam  500 mg Oral BID  . levothyroxine  50 mcg Oral QAC breakfast  . mirtazapine  15 mg Oral QHS  .  multivitamin with minerals  1 tablet Oral Q1200  . polycarbophil  625 mg Oral Q1200  . polyethylene glycol  17 g Oral Daily  . senna-docusate  2 tablet Oral Daily  . sodium chloride  3 mL Intravenous Q12H    Continuous Infusions:   . sodium chloride 50 mL/hr (09/27/12 1141)    Maureen Chatters, RD, LDN Pager #: [phone] After-Hours Pager #: [phone]

## 2012-09-27 NOTE — Progress Notes (Addendum)
[facility] Progress Note  TEAM 1 - Stepdown/ICU TEAM   [name] RUE:[id number] DOB: April 12, 1931 DOA: 09/20/2012 PCP: [name], MD  Brief narrative: 79 y.o. female who was admitted on 09/20/2012 after standing at the sink and turning quickly then subsequently falling to the floor with possible syncope. H and P states she felt a warmth/flushing and passed out onto her left side  Her husband was present, and denied seeing any seizure-like activity. She sustained left hip fracture for which she had surgery on 09/21/2012 after reversal of her coumadin (for atrial fibrillation).  On 09/24/2012 she was at a bedside commode (consitpated) and had an episode of unresponsiveness, respirations slowed, heart rate increased to 160s, SBP 96. Code started, but patient returned to baseline once back to bed, < 1 minute. Of note, she had taken 2 vicodin about 1 hour prior to this event. She was taken to MRI which showed acute/subacute hemorrhage within the choroid of the L lateral ventricle with a small amount of intraventricular blood layering dependently - also a small amount of subdural blood along both convexities L>R, but only a few millimeters in thickness.   Assessment/Plan:  Syncope  Initial episode at home Second episode while on commode in hospital may have been vasovagal in addition to the Vicodin she had received 1 hr before Check orthostatics now keeping in mind that she has been hydrated with 100 cc/hr of NS Cont tele - out-patient event monitor to be arranged at time of d/c per Cardiology (Dr. Jens Som)  Hip fracture s/p L Bipolar arthroplasty  TED hose, SCDs - no pharmacologic anticoagulation secondary to above - Ortho following  Pt recommending SNF- Pt does not want to go to SNF for rehab- unsure of plan at this time  Left intraventricular hemorrhage/subdural hematoma 09/24/2012 (not present on CT head at admit 09/20/2012) Per Neuro "based on imaging hemorrhage likely  spontaneous or possibly late and related to previous injury" - Coumadin reversed - repeat CT brain 09/25/2012 confirmed stability of bleed - I suspect it was from the original syncopal episode -  Per Dr. [name], patient may be up with physical therapy - holding further anticoagulation until cleared for same by Neuro  Urinary retention Patient required Foley catheter replacement 09/25/2012 - post Foley insertion returned greater than a liter of urine collected - would not discontinue current foley until more mobile and able to participate in voiding trial   Chronic Atrial fibrillation/flutter w/ RVR -Rate controlled presently on dig and cardizem PO - Cardiology has seen  - no anticoagulation at this time as stated above  Anemia postop  -Hgb dropped from 12/8 to 7/2 - no blood given - guaiac stools when pt has a BM - blood loss likely related to fracture - anemia panel in AM  Hypotension BP marginal - pt with little intake - encouraged diet - follow trend -  IVF being given at 100 cc/hr- will decrease to 50 cc/hr and encourage PO liquids but since she is on thick liquids, she may not drink much-   Constipation Scheduled Senokot and MiraLAX are not effective- was even given PO Dulcolax a few days ago Will add a dulcolox suppository for today. Would give again tomorrow if minimal BM today.   Osteopenia Takes calcium- check Vit D levels  Seizure disorder Cont Keppra  Hypothyroidism cont synthroid - TSH at goal  History of CVA (cerebrovascular accident)  R MCA CVA >> received tPA (05/2011) - left her with left sided weakness  Protein caloric malnutrition Ensure added   Code Status: FULL Family Communication: spoke w/ pt and husband at bedside  Disposition Plan: SNF vs/ CIR?? -   Consultants: Neuro Ortho Cardiology  Antibiotics: none  DVT prophylaxis: SCDs only   HPI/Subjective: The patient is alert and responsive laying in bed- appears quite weak.  She is still  constipated- no other complaints.    Objective: Blood pressure 104/57, pulse 93, temperature 98.9 F (37.2 C), temperature source Oral, resp. rate 21, height 5\' 8"  (1.727 m), weight 56 kg (123 lb 7.3 oz), SpO2 96.00%.  Intake/Output Summary (Last 24 hours) at 09/27/12 1123 Last data filed at 09/27/12 0805  Gross per 24 hour  Intake   2597 ml  Output   1350 ml  Net   1247 ml     Exam: General: oriented x 3 - No  distress - extensive bruising about the face greatest on left with ping pong ball sized hematoma beneath the left eye without evidence of extending erythema or calor Lungs: Clear to auscultation bilaterally without wheezes or crackles Cardiovascular: Regular rate without appreciable gallop or murmur Abdomen: Nontender, nondistended, soft, bowel sounds positive, no rebound, no ascites, no appreciable mass Extremities: No significant cyanosis, clubbing, or edema bilateral lower extremities  Data Reviewed: Basic Metabolic Panel:  Lab 09/27/12 1610 09/25/12 0600 09/24/12 0455 09/23/12 0725 09/22/12 0255  NA 138 140 136 139 138  K 3.9 4.0 3.4* 3.5 3.0*  CL 105 103 103 107 105  CO2 24 30 30 28 26   GLUCOSE 98 110* 102* 107* 106*  BUN 17 18 20 17 9   CREATININE 0.52 0.61 0.78 0.70 0.60  CALCIUM 8.6 9.3 8.5 8.6 8.6  MG -- -- -- -- --  PHOS -- -- -- -- --   CBC:  Lab 09/27/12 0605 09/26/12 0435 09/25/12 0600 09/24/12 1952 09/24/12 0455 09/23/12 0725  WBC 9.1 11.8* 12.1* -- 8.8 9.8  NEUTROABS -- -- -- -- -- --  HGB 8.3* 8.4* 9.0* 8.9* 7.2* --  HCT 24.2* 24.6* 26.4* 25.8* 21.5* --  MCV 89.6 90.4 90.7 -- 90.3 89.3  PLT 198 150 136* -- 121* 125*   Cardiac Enzymes:  Lab 09/21/12 0834 09/20/12 2322 09/20/12 1811  CKTOTAL -- -- --  CKMB -- -- --  CKMBINDEX -- -- --  TROPONINI <0.30 <0.30 <0.30   CBG:  Lab 09/24/12 1508  GLUCAP 123*    Studies:  Recent x-ray studies have been reviewed in detail by the Attending Physician  Scheduled Meds:  Reviewed in detail by  the Attending Physician   Calvert Cantor, MD [phone]  If 7PM-7AM, please contact night-coverage [email or web address] Password [email or web address] 09/27/2012, 11:23 AM   LOS: 7 days

## 2012-09-27 NOTE — Progress Notes (Signed)
Speech Language Pathology Dysphagia Treatment Patient Details Name: [name] MRN: [id number] DOB: 06-25-1931 Today's Date: 09/27/2012 Time: 8295-6213 SLP Time Calculation (min): 34 min  Assessment / Plan / Recommendation Clinical Impression  F/u after yesterday's MBS.  Pt assisted with breakfast; positioned in bed to accommodate curvature of spine and maximize safety.  Pt able to feed self after set-up and with occasional assist.  Head rotated to right throughout meal; required consistent cues to move to center; mod assist to follow precautions to reduce pharyngeal residue and minimize aspiration risk, even with modified diet.  Pt with minimal intake; overall with adequate toleration.      Diet Recommendation  Continue with Current Diet: Dysphagia 2 (fine chop);Nectar-thick liquid   SLP Plan   continue toward goals  Pertinent Vitals/Pain No pain identified  Swallowing Goals  SLP Swallowing Goals Patient will utilize recommended strategies during swallow to increase swallowing safety with: Supervision/safety Swallow Study Goal #2 - Progress: Progressing toward goal Goal #3: Pt will reduce signs of aspiration with minimal assistance and cueing for compensatory strategies and appropriate posture.  Swallow Study Goal #3 - Progress: Progressing toward goal  General Temperature Spikes Noted: No Respiratory Status: Room air Behavior/Cognition: Cooperative;Lethargic;Requires cueing Oral Cavity - Dentition: Adequate natural dentition Patient Positioning: Upright in bed  Oral Cavity - Oral Hygiene Does patient have any of the following "at risk" factors?: None of the above Patient is AT RISK - Oral Care Protocol followed (see row info): Yes   Dysphagia Treatment Treatment focused on: Skilled observation of diet tolerance;Facilitation of pharyngeal phase Treatment Methods/Modalities: Skilled observation;Effortful swallow Patient observed directly with PO's: Yes Type of PO's observed:  Dysphagia 2 (chopped);Nectar-thick liquids Feeding: Able to feed self;Needs set up;Needs assist Liquids provided via: Cup Oral Phase Signs & Symptoms: Prolonged mastication;Prolonged oral phase Pharyngeal Phase Signs & Symptoms: Suspected delayed swallow initiation;Multiple swallows; wet vocal quality requiring cues to clear throat/dry swallow.  Poor self-monitoring skills.  Husband present but not engaged. Type of cueing: Verbal;Tactile Amount of cueing: Moderate   GO    Rowland Ericsson L. Samson Frederic, [place] CCC/SLP Pager [phone]  Blenda Mounts Laurice 09/27/2012, 11:21 AM

## 2012-09-27 NOTE — Progress Notes (Signed)
Subjective: 6 Days Post-Op Procedure(s) (LRB): ARTHROPLASTY BIPOLAR HIP (Left) Awake alert Ox4, no HAs OOB to recliner yesterday. Patient reports pain as mild.    Objective:   VITALS:  Temp:  [98 F (36.7 C)-100.4 F (38 C)] 98.9 F (37.2 C) (01/09 0754) Pulse Rate:  [81-96] 93  (01/09 0754) Resp:  [16-23] 21  (01/09 0754) BP: (104-128)/(43-70) 104/57 mmHg (01/09 0754) SpO2:  [94 %-98 %] 96 % (01/09 0754) Weight:  [56 kg (123 lb 7.3 oz)] 56 kg (123 lb 7.3 oz) (01/09 0451)  Neurologically intact ABD soft Neurovascular intact Sensation intact distally Intact pulses distally Dorsiflexion/Plantar flexion intact Incision: no drainage No cellulitis present Compartment soft   LABS  Basename 09/27/12 0605 09/26/12 0435 09/25/12 0600 09/24/12 1952  HGB 8.3* 8.4* 9.0* 8.9*  WBC 9.1 11.8* -- --  PLT 198 150 -- --    Basename 09/27/12 0605 09/25/12 0600  NA 138 140  K 3.9 4.0  CL 105 103  CO2 24 30  BUN 17 18  CREATININE 0.52 0.61  GLUCOSE 98 110*    Basename 09/25/12 0600  LABPT --  INR 1.06     Assessment/Plan: 6 Days Post-Op Procedure(s) (LRB): ARTHROPLASTY BIPOLAR HIP (Left) Peri op acute blood loss anemia.  Advance diet Up with therapy Discharge to SNF eventually. Stable Orthopaedically, Bed to chair and W/c weight bear as tolerated left  Leg posterior hip precautions. No anticoagulants due to recent Intraventricular bleed.  [name] E 09/27/2012, 8:15 AM

## 2012-09-28 LAB — TYPE AND SCREEN
ABO/RH(D): O POS
Unit division: 0
Unit division: 0

## 2012-09-28 LAB — CBC
Hemoglobin: 8.2 g/dL — ABNORMAL LOW (ref 12.0–15.0)
MCH: 30.5 pg (ref 26.0–34.0)
MCV: 90 fL (ref 78.0–100.0)
RBC: 2.69 MIL/uL — ABNORMAL LOW (ref 3.87–5.11)
WBC: 8.3 10*3/uL (ref 4.0–10.5)

## 2012-09-28 LAB — IRON AND TIBC
Iron: 37 ug/dL — ABNORMAL LOW (ref 42–135)
Saturation Ratios: 23 % (ref 20–55)
TIBC: 161 ug/dL — ABNORMAL LOW (ref 250–470)

## 2012-09-28 LAB — VITAMIN B12: Vitamin B-12: 1177 pg/mL — ABNORMAL HIGH (ref 211–911)

## 2012-09-28 LAB — FERRITIN: Ferritin: 291 ng/mL (ref 10–291)

## 2012-09-28 LAB — RETICULOCYTES: Retic Ct Pct: 3.5 % — ABNORMAL HIGH (ref 0.4–3.1)

## 2012-09-28 NOTE — Progress Notes (Signed)
Subjective: 7 Days Post-Op Procedure(s) (LRB): ARTHROPLASTY BIPOLAR HIP (Left) Awake, alert Ox4, in good spirits.2 Patient reports pain as mild.    Objective:   VITALS:  Temp:  [97.9 F (36.6 C)-99.6 F (37.6 C)] 97.9 F (36.6 C) (01/10 1330) Pulse Rate:  [81-92] 92  (01/10 1330) Resp:  [16-18] 16  (01/10 1600) BP: (107-118)/(55-61) 118/61 mmHg (01/10 1330) SpO2:  [92 %-97 %] 95 % (01/10 1600)  Neurologically intact ABD soft Neurovascular intact Sensation intact distally Intact pulses distally Dorsiflexion/Plantar flexion intact Incision: no drainage   LABS  Basename 09/28/12 0515 09/27/12 0605 09/26/12 0435  HGB 8.2* 8.3* 8.4*  WBC 8.3 9.1 --  PLT 235 198 --    Basename 09/27/12 0605  NA 138  K 3.9  CL 105  CO2 24  BUN 17  CREATININE 0.52  GLUCOSE 98   No results found for this basename: LABPT:2,INR:2 in the last 72 hours   Assessment/Plan: 7 Days Post-Op Procedure(s) (LRB): ARTHROPLASTY BIPOLAR HIP (Left) Laceration left orbit 7 days post suture repair.  Sutures removed left orbit. Advance diet Up with therapy Discharge to SNF Should mobilize, with PT gait training. Orthopaedically stable, partners will see once this weekend but are available if any ortho concerns NITKA,JAMES E 09/28/2012, 5:12 PM

## 2012-09-28 NOTE — Clinical Social Work Note (Signed)
CSW spoke with Pt and her husband regarding dc plans. Pt's husband does not drive and would like Pt to be as close to possible to him for her rehab. CSW discussed options with Pt and her husband, they selected [facility]. CSW contacted [facility] to report possible dc early next week.  SNF stated that they will be ready.   Frederico Hamman, LCSW [phone]

## 2012-09-28 NOTE — Progress Notes (Signed)
[facility] Progress Note    [name] XBJ:[id number] DOB: [birth year] DOA: 09/20/2012 PCP: [name], MD  Brief narrative: 79 y.o. female who was admitted on 09/20/2012 after standing at the sink and turning quickly then subsequently falling to the floor with possible syncope. H and P states she felt a warmth/flushing and passed out onto her left side  Her husband was present, and denied seeing any seizure-like activity. She sustained left hip fracture for which she had surgery on 09/21/2012 after reversal of her coumadin (for atrial fibrillation).  On 09/24/2012 she was at a bedside commode (consitpated) and had an episode of unresponsiveness, respirations slowed, heart rate increased to 160s, SBP 96. Code started, but patient returned to baseline once back to bed, < 1 minute. Of note, she had taken 2 vicodin about 1 hour prior to this event. She was taken to MRI which showed acute/subacute hemorrhage within the choroid of the L lateral ventricle with a small amount of intraventricular blood layering dependently - also a small amount of subdural blood along both convexities L>R, but only a few millimeters in thickness.   Assessment/Plan:  Syncope  Initial episode at home Second episode while on commode in hospital may have been vasovagal in addition to the Vicodin she had received 1 hr before.  Cont tele - out-patient event monitor to be arranged at time of d/c per Cardiology (Dr. Jens Som)  Hip fracture s/p L Bipolar arthroplasty  TED hose, SCDs - no pharmacologic anticoagulation secondary to above - Ortho following  Pt recommending SNF- Pt does not want to go to SNF for rehab- unsure of plan at this time  Left intraventricular hemorrhage/subdural hematoma 09/24/2012 (not present on CT head at admit 09/20/2012) Per Neuro "based on imaging hemorrhage likely spontaneous or possibly late and related to previous injury" - Coumadin reversed - repeat CT brain 09/25/2012 confirmed  stability of bleed - I suspect it was from the original syncopal episode -  Per Dr. [name], patient may be up with physical therapy - holding further anticoagulation until cleared for same by Neuro  Urinary retention Patient required Foley catheter replacement 09/25/2012 - post Foley insertion returned greater than a liter of urine collected - would not discontinue current foley until more mobile and able to participate in voiding trial   Chronic Atrial fibrillation/flutter w/ RVR -Rate controlled presently on dig and cardizem PO - Cardiology has seen  - no anticoagulation at this time as stated above  Anemia postop  -Hgb dropped from 12/8 to 7/2 - no blood given - guaiac stools when pt has a BM - blood loss likely related to fracture - anemia panel in AM  Hypotension BP marginal - pt with little intake - encouraged diet - follow trend -  IVF being given at 100 cc/hr- will decrease to 50 cc/hr and encourage PO liquids but since she is on thick liquids, she may not drink much-   Constipation Scheduled Senokot and MiraLAX are not effective- was even given PO Dulcolax a few days ago Will add a dulcolox suppository for today. Would give again tomorrow if minimal BM today.   Osteopenia Takes calcium- check Vit D levels  Seizure disorder Cont Keppra  Hypothyroidism cont synthroid - TSH at goal  History of CVA (cerebrovascular accident)  R MCA CVA >> received tPA (05/2011) - left her with left sided weakness  Code Status: FULL Family Communication: spoke w/ pt and husband at bedside  Disposition Plan: SNF vs/ CIR?? -  Consultants: Neuro Ortho Cardiology  Antibiotics: none  DVT prophylaxis: SCDs only   HPI/Subjective: The patient is alert and responsive laying in bed- appears quite weak- no other complaints.    Objective: Blood pressure 114/55, pulse 84, temperature 99.6 F (37.6 C), temperature source Oral, resp. rate 16, height 5\' 8"  (1.727 m), weight 56 kg (123 lb  7.3 oz), SpO2 95.00%.  Intake/Output Summary (Last 24 hours) at 09/28/12 1205 Last data filed at 09/28/12 0512  Gross per 24 hour  Intake    393 ml  Output    825 ml  Net   -432 ml     Exam: General: oriented x 3 - No  distress - extensive bruising about the face greatest on left with ping pong ball sized hematoma beneath the left eye without evidence of extending erythema or calor Lungs: Clear to auscultation bilaterally without wheezes or crackles Cardiovascular: Regular rate without appreciable gallop or murmur Abdomen: Nontender, nondistended, soft, bowel sounds positive, no rebound, no ascites, no appreciable mass Extremities: No significant cyanosis, clubbing, or edema bilateral lower extremities  Data Reviewed: Basic Metabolic Panel:  Lab 09/27/12 4098 09/25/12 0600 09/24/12 0455 09/23/12 0725 09/22/12 0255  NA 138 140 136 139 138  K 3.9 4.0 3.4* 3.5 3.0*  CL 105 103 103 107 105  CO2 24 30 30 28 26   GLUCOSE 98 110* 102* 107* 106*  BUN 17 18 20 17 9   CREATININE 0.52 0.61 0.78 0.70 0.60  CALCIUM 8.6 9.3 8.5 8.6 8.6  MG -- -- -- -- --  PHOS -- -- -- -- --   CBC:  Lab 09/28/12 0515 09/27/12 0605 09/26/12 0435 09/25/12 0600 09/24/12 1952 09/24/12 0455  WBC 8.3 9.1 11.8* 12.1* -- 8.8  NEUTROABS -- -- -- -- -- --  HGB 8.2* 8.3* 8.4* 9.0* 8.9* --  HCT 24.2* 24.2* 24.6* 26.4* 25.8* --  MCV 90.0 89.6 90.4 90.7 -- 90.3  PLT 235 198 150 136* -- 121*   Cardiac Enzymes: No results found for this basename: CKTOTAL:5,CKMB:5,CKMBINDEX:5,TROPONINI:5 in the last 168 hours CBG:  Lab 09/24/12 1508  GLUCAP 123*    Studies:  Recent x-ray studies have been reviewed in detail by the Attending Physician  Scheduled Meds:  Reviewed in detail by the Attending Physician   Kathlen Mody 319 [phone]  If 7PM-7AM, please contact night-coverage [email or web address] Password [email or web address] 09/28/2012, 12:05 PM   LOS: 8 days

## 2012-09-28 NOTE — Progress Notes (Signed)
Speech Language Pathology Dysphagia Treatment Patient Details Name: [name] MRN: [id number] DOB: 09/29/1930 Today's Date: 09/28/2012 Time: 1308-6578 SLP Time Calculation (min): 21 min  Assessment / Plan / Recommendation Clinical Impression  Patient appears to be tolerating current diet relatively well, with clear LS; afebrile.  Continues to require multiple swallows with both nectar and thin liquids, indicative of  pharyngeal residue, and audible swallows noted with thin; throat clearing, but no cough noted.Marland Kitchen Re-educated patient to diet recommendations and reasoning for Nectar thick.    Diet Recommendation  Continue with Current Diet: Dysphagia 2 (fine chop);Nectar-thick liquid    SLP Plan MBS (Repeat MBS prior to advancing diet)   Pertinent Vitals/Pain n/a   Swallowing Goals  SLP Swallowing Goals Patient will consume recommended diet without observed clinical signs of aspiration with: Moderate assistance Patient will utilize recommended strategies during swallow to increase swallowing safety with: Minimal assistance Goal #3: Pt will reduce signs of aspiration with minimal assistance and cueing for compensatory strategies and appropriate posture.  Swallow Study Goal #3 - Progress: Progressing toward goal  General Temperature Spikes Noted: No Respiratory Status: Room air Behavior/Cognition: Alert;Cooperative Oral Cavity - Dentition: Adequate natural dentition Patient Positioning: Upright in chair  Oral Cavity - Oral Hygiene Does patient have any of the following "at risk" factors?: None of the above Brush patient's teeth BID with toothbrush (using toothpaste with fluoride): Yes Patient is HIGH RISK - Oral Care Protocol followed (see row info): Yes Patient is AT RISK - Oral Care Protocol followed (see row info): Yes   Dysphagia Treatment Treatment focused on: Skilled observation of diet tolerance;Facilitation of pharyngeal phase Treatment Methods/Modalities: Skilled  observation;Effortful swallow Patient observed directly with PO's: Yes Type of PO's observed: Nectar-thick liquids Feeding: Needs set up Liquids provided via: Cup Pharyngeal Phase Signs & Symptoms: Suspected delayed swallow initiation;Multiple swallows;Delayed throat clear Type of cueing: Verbal;Tactile Amount of cueing: Moderate   GO     Maryjo Rochester T 09/28/2012, 3:49 PM

## 2012-09-28 NOTE — Progress Notes (Signed)
Occupational Therapy Treatment Patient Details Name: [name] MRN: [id number] DOB: 02-07-1931 Today's Date: 09/28/2012 Time: 9629-5284 OT Time Calculation (min): 26 min  OT Assessment / Plan / Recommendation Comments on Treatment Session Pt slowly making progress. Pt is demonstrating visual impairment in left field (requiring multimodal cues to scan environment).  Will continue to assess vision.     Follow Up Recommendations  SNF    Barriers to Discharge       Equipment Recommendations  3 in 1 bedside comode    Recommendations for Other Services    Frequency Min 2X/week   Plan Discharge plan remains appropriate    Precautions / Restrictions Precautions Precautions: Fall;Posterior Hip Precaution Booklet Issued: Yes (comment) Precaution Comments: Pt able to recall all 3 hip precautions with prompting for not bending past 90 degrees and not rotating foot in.  Restrictions LLE Weight Bearing: Weight bearing as tolerated   Pertinent Vitals/Pain See vitals    ADL  Grooming: Brushing hair;Moderate assistance;Performed Where Assessed - Grooming: Supported standing Equipment Used: Gait belt Transfers/Ambulation Related to ADLs: +2 total assist (pt=50%) for sit<>stand x 4.  Cueing to put weight through LLE.  Pt buckling in right knee and flexed at hips with posterior lean.  Multiple manual cues to obtain upright posture but pt unable to tolerate standing except once which was ~1 minute. ADL Comments: Pt requires verbal and manual cues to turn head to left in order to scan environment.     OT Diagnosis:    OT Problem List:   OT Treatment Interventions:     OT Goals ADL Goals Pt Will Perform Grooming: with min assist;Supported;Standing at sink;with cueing (comment type and amount) ADL Goal: Grooming - Progress: Progressing toward goals Pt Will Transfer to Toilet: with mod assist;Ambulation;with DME;3-in-1;Maintaining hip precautions ADL Goal: Toilet Transfer - Progress:  Progressing toward goals Additional ADL Goal #1: Pt will verbalize 3/3 hip precautions with min vc ADL Goal: Additional Goal #1 - Progress: Progressing toward goals  Visit Information  Last OT Received On: 09/28/12 Assistance Needed: +2    Subjective Data      Prior Functioning       Cognition  Overall Cognitive Status: Impaired Area of Impairment: Memory Arousal/Alertness: Awake/alert Orientation Level: Disoriented to;Time Behavior During Session: WFL for tasks performed Current Attention Level: Sustained Attention - Other Comments: pt with decreased attention to task after 2 min Memory: Decreased recall of precautions Memory Deficits: Required prompting to recall 2/3 hip precautions. Awareness of Errors: Assistance required to identify errors made;Assistance required to correct errors made Problem Solving: slow to process Cognition - Other Comments: At end of session, pt reporting she has been seeing a gray cat walking past her room in the hallway.    Mobility  Shoulder Instructions Bed Mobility Bed Mobility: Not assessed Transfers Transfers: Sit to Stand;Stand to Sit Sit to Stand: 1: +2 Total assist;From chair/3-in-1;With upper extremity assist;With armrests Sit to Stand: Patient Percentage: 50% Stand to Sit: 1: +2 Total assist;To chair/3-in-1;With armrests;With upper extremity assist Stand to Sit: Patient Percentage: 30% Details for Transfer Assistance: Pt stood with +2 assist and bil knees blocked. Assist for anterior translation and power up from chair. Pt with heavy posterior lean and bil knee buckling.  Pt stood x4 at sink using countertop for bil UE support.  First 3 stands, pt unable tol hold stand >10 seconds.  Fourth attempt pt was able to stand ~1 minute.       Exercises    Balance Balance  Balance Assessed: Yes Dynamic Standing Balance Dynamic Standing - Balance Support: Left upper extremity supported;During functional activity Dynamic Standing - Level of  Assistance: 1: +2 Total assist;Patient percentage (comment);Other (comment) (pt=40-50%) Dynamic Standing - Balance Activities: Forward lean/weight shifting Dynamic Standing - Comments: Pt brushing hair with RUE ~20 seconds with +2 assist for balance due to bil knee buckling and heavy posterior lean.   End of Session OT - End of Session Equipment Utilized During Treatment: Gait belt Activity Tolerance: Patient tolerated treatment well Patient left: in chair;with call bell/phone within reach Nurse Communication: Mobility status  GO    09/28/2012 Cipriano Mile OTR/L Pager [phone] Office [phone]  Cipriano Mile 09/28/2012, 5:26 PM

## 2012-09-28 NOTE — Clinical Documentation Improvement (Signed)
[facility]AL[facility]ITION DOCUMENTATION CLARIFICATION  THIS DOCUMENT IS NOT A PERMANENT PART OF THE MEDICAL RECORD  TO RESPOND TO THE THIS QUERY, FOLLOW THE INSTRUCTIONS BELOW:  1. If needed, update documentation for the patient's encounter via the notes activity.  2. Access this query again and click edit on the In [employer].  3. After updating, or not, click F2 to complete all highlighted (required) fields concerning your review. Select "additional documentation in the medical record" OR "no additional documentation provided".  4. Click Sign note button.  5. The deficiency will fall out of your In Basket *Please let us know if you are not able to complete this workflow by phone or e-mail (listed below).  Please update your documentation within the medical record to reflect your response to this query.                                                                                        09/28/12   Dear Dr.Arnell Slivinski / Associates, In a better effort to capture your patient's severity of illness, reflect appropriate length of stay and utilization of resources, a review of the patient medical record has revealed the following indicators. Based on your clinical judgment, please clarify and document in a progress note and/or discharge summary the clinical condition associated with the following supporting information:In responding to this query please exercise your independent judgment.  The fact that a query is asked, does not imply that any particular answer is desired or expected.  Possible Clinical Conditions?  Severe Malnutrition   Protein Calorie Malnutrition Severe Protein Calorie Malnutrition Other Condition Cannot clinically determine   Supporting Information:  Risk Factors: poor appetite; dysphagia; stroke  Signs & Symptoms: -Ht: 5'8 Wt: 123  -BMI: 17.5 kg/m2  % Ideal Body Weight: 82%  Treatments: -Enteral Feeding: Ensure Complete twice daily between meals (350 kcals, 13 gm protein  per 8  -Nutrition Consult: following  You may use possible, probable, or suspect with inpatient documentation. possible, probable, suspected diagnoses MUST be documented at the time of discharge  Reviewed: additional documentation in the medical record in my last note   Thank You,  Amada Kingfisher RN, BSN, CCM  Clinical Documentation Specialist: Health Information Management [place] [phone] [email or web address]

## 2012-09-28 NOTE — Progress Notes (Signed)
Physical Therapy Treatment Patient Details Name: [name] MRN: [id number] DOB: [birth year] Today's Date: 09/28/2012 Time: 4098-1191 PT Time Calculation (min): 26 min  PT Assessment / Plan / Recommendation Comments on Treatment Session  Pt admitted after fall with L hip fx s/p hemiarthroplasty, and SDH. Pt with limited cognition and continues to maintain right neck rotation with flexion despite cueing and stretching into left rotation. Pt educated for precautions and staff educated for transfers. Will continue to follow.     Follow Up Recommendations        Does the patient have the potential to tolerate intense rehabilitation     Barriers to Discharge        Equipment Recommendations       Recommendations for Other Services    Frequency     Plan Discharge plan remains appropriate;Frequency remains appropriate    Precautions / Restrictions Precautions Precautions: Fall Precaution Booklet Issued: Yes (comment) Precaution Comments: new sheet provided above pt bed, pt educated for all precautions as unable to recall them Restrictions LLE Weight Bearing: Weight bearing as tolerated   Pertinent Vitals/Pain No pain reported other than neck stiffness Pt positioned with towel roll to achieve midline head position    Mobility  Bed Mobility Supine to Sit: 1: +2 Total assist;HOB elevated (HOB 20degrees) Supine to Sit: Patient Percentage: 40% Sitting - Scoot to Edge of Bed: 1: +1 Total assist Details for Bed Mobility Assistance: Pt with max cueing for assist with pivoting bil LE to EOB and to elevate trunk from surface with increased time but pt with increased participation today Transfers Sit to Stand: From bed;1: +2 Total assist Sit to Stand: Patient Percentage: 40% Stand to Sit: 1: +2 Total assist;To chair/3-in-1;To bed Stand Pivot Transfers: 1: +2 Total assist Stand Pivot Transfers: Patient Percentage: 30% Details for Transfer Assistance: Pt stood initially with 2 person  assist and knees blocked with increased assist with anterior translation and LLE blocked and positioned to maintain precautions. Attempted standing 2nd trial with RW but unable to achieve full standing without increased physical assist and proximity of therapist to pt. 3rd trial again without DME with legs blocked and pt cued for sequence with assist to pivot pelvis and maintain precautions pt unable to assist with pelvic translation with transfer.  Ambulation/Gait Ambulation/Gait Assistance: Not tested (comment)    Exercises General Exercises - Lower Extremity Long Arc Quad: AROM;AAROM;15 reps;Seated;Right;Left (AAROM LLE, AROM RLE) Hip ABduction/ADduction: AROM;AAROM;Right;Left;15 reps;Seated (AAROM LLE, AROM RLE) Hip Flexion/Marching: AROM;AAROM;Right;Left;15 reps;Seated (AAROM LLE, AROM RLE)   PT Diagnosis:    PT Problem List:   PT Treatment Interventions:     PT Goals Acute Rehab PT Goals PT Goal: Supine/Side to Sit - Progress: Progressing toward goal PT Goal: Sit to Stand - Progress: Progressing toward goal PT Transfer Goal: Bed to Chair/Chair to Bed - Progress: Progressing toward goal PT Goal: Ambulate - Progress: Not progressing  Visit Information  Last PT Received On: 09/28/12 Assistance Needed: +2    Subjective Data  Subjective: "where do I go to bed"- pt stated while in bed   Cognition  Overall Cognitive Status: Impaired Area of Impairment: Memory;Problem solving;Attention Arousal/Alertness: Awake/alert Orientation Level: Disoriented to;Place;Situation;Time Behavior During Session: Flat affect Current Attention Level: Sustained Attention - Other Comments: pt with decreased attention to task after 2 min Memory: Decreased recall of precautions Awareness of Errors: Assistance required to identify errors made;Assistance required to correct errors made Problem Solving: slow to process    Balance  Static Sitting Balance  Static Sitting - Balance Support: Bilateral upper  extremity supported;Feet supported Static Sitting - Level of Assistance: 4: Min assist Static Sitting - Comment/# of Minutes: pt pushing through bil UE today with tendency toward posterior right lean with cueing to sequence and complete positioning to midline  End of Session PT - End of Session Equipment Utilized During Treatment: Gait belt Activity Tolerance: Patient limited by fatigue Patient left: in chair;with call bell/phone within reach Nurse Communication: Mobility status   GP     Delorse Lek 09/28/2012, 3:33 PM Delaney Meigs, PT [phone]

## 2012-09-29 LAB — CBC
HCT: 25.1 % — ABNORMAL LOW (ref 36.0–46.0)
MCH: 31 pg (ref 26.0–34.0)
MCV: 90.6 fL (ref 78.0–100.0)
Platelets: 291 10*3/uL (ref 150–400)
RBC: 2.77 MIL/uL — ABNORMAL LOW (ref 3.87–5.11)
WBC: 8.7 10*3/uL (ref 4.0–10.5)

## 2012-09-29 MED ORDER — ALPRAZOLAM 0.5 MG PO TABS
0.5000 mg | ORAL_TABLET | Freq: Two times a day (BID) | ORAL | Status: DC | PRN
  Administered 2012-09-29: 0.5 mg via ORAL
  Filled 2012-09-29: qty 1

## 2012-09-29 NOTE — Progress Notes (Signed)
[facility] Progress Note    [name] MVH:[id number] DOB: 05-11-1931 DOA: 09/20/2012 PCP: [name], MD  Brief narrative: 79 y.o. female who was admitted on 09/20/2012 after standing at the sink and turning quickly then subsequently falling to the floor with possible syncope. H and P states she felt a warmth/flushing and passed out onto her left side  Her husband was present, and denied seeing any seizure-like activity. She sustained left hip fracture for which she had surgery on 09/21/2012 after reversal of her coumadin (for atrial fibrillation).  On 09/24/2012 she was at a bedside commode (consitpated) and had an episode of unresponsiveness, respirations slowed, heart rate increased to 160s, SBP 96. Code started, but patient returned to baseline once back to bed, < 1 minute. Of note, she had taken 2 vicodin about 1 hour prior to this event. She was taken to MRI which showed acute/subacute hemorrhage within the choroid of the L lateral ventricle with a small amount of intraventricular blood layering dependently - also a small amount of subdural blood along both convexities L>R, but only a few millimeters in thickness.   Assessment/Plan:  Syncope  Initial episode at home Second episode while on commode in hospital may have been vasovagal in addition to the Vicodin she had received 1 hr before.  Cont tele - out-patient event monitor to be arranged at time of d/c per Cardiology (Dr. Jens Som)  Hip fracture s/p L Bipolar arthroplasty  TED hose, SCDs - no pharmacologic anticoagulation secondary to above - Ortho following  Pt recommending SNF- Pt does not want to go to SNF for rehab- unsure of plan at this time  Left intraventricular hemorrhage/subdural hematoma 09/24/2012 (not present on CT head at admit 09/20/2012) Per Neuro "based on imaging hemorrhage likely spontaneous or possibly late and related to previous injury" - Coumadin reversed - repeat CT brain 09/25/2012 confirmed  stability of bleed - I suspect it was from the original syncopal episode -  Per Dr. [name], patient may be up with physical therapy - holding further anticoagulation until cleared for same by Neuro  Urinary retention Patient required Foley catheter replacement 09/25/2012 - post Foley insertion returned greater than a liter of urine collected - would not discontinue current foley until more mobile and able to participate in voiding trial   Chronic Atrial fibrillation/flutter w/ RVR -Rate controlled presently on dig and cardizem PO - Cardiology has seen  - no anticoagulation at this time as stated above  Anemia postop  -Hgb dropped from 12/8 to 7/2 - no blood given - guaiac stools when pt has a BM - blood loss likely related to fracture - anemia panel in AM  Hypotension BP marginal - pt with little intake - encouraged diet - follow trend -  IVF being given at 100 cc/hr- will decrease to 50 cc/hr and encourage PO liquids but since she is on thick liquids, she may not drink much-   Constipation Scheduled Senokot and MiraLAX are not effective- was even given PO Dulcolax a few days ago Will add a dulcolox suppository for today. Would give again tomorrow if minimal BM today.   Osteopenia Takes calcium- check Vit D levels  Seizure disorder Cont Keppra  Hypothyroidism cont synthroid - TSH at goal  History of CVA (cerebrovascular accident)  R MCA CVA >> received tPA (05/2011) - left her with left sided weakness  Code Status: FULL Family Communication: spoke w/ pt and husband at bedside  Disposition Plan: SNF vs/ CIR?? -  Consultants: Neuro Ortho Cardiology  Antibiotics: none  DVT prophylaxis: SCDs only   HPI/Subjective: The patient is alert and responsive laying in bed- appears quite weak- no other complaints.    Objective: Blood pressure 108/54, pulse 90, temperature 99.8 F (37.7 C), temperature source Oral, resp. rate 16, height 5\' 8"  (1.727 m), weight 56 kg (123 lb  7.3 oz), SpO2 97.00%.  Intake/Output Summary (Last 24 hours) at 09/29/12 1739 Last data filed at 09/29/12 1319  Gross per 24 hour  Intake    243 ml  Output    950 ml  Net   -707 ml     Exam: General: oriented x 3 - No  distress - extensive bruising about the face greatest on left with ping pong ball sized hematoma beneath the left eye without evidence of extending erythema or calor Lungs: Clear to auscultation bilaterally without wheezes or crackles Cardiovascular: Regular rate without appreciable gallop or murmur Abdomen: Nontender, nondistended, soft, bowel sounds positive, no rebound, no ascites, no appreciable mass Extremities: No significant cyanosis, clubbing, or edema bilateral lower extremities  Data Reviewed: Basic Metabolic Panel:  Lab 09/27/12 1478 09/25/12 0600 09/24/12 0455 09/23/12 0725  NA 138 140 136 139  K 3.9 4.0 3.4* 3.5  CL 105 103 103 107  CO2 24 30 30 28   GLUCOSE 98 110* 102* 107*  BUN 17 18 20 17   CREATININE 0.52 0.61 0.78 0.70  CALCIUM 8.6 9.3 8.5 8.6  MG -- -- -- --  PHOS -- -- -- --   CBC:  Lab 09/29/12 0500 09/28/12 0515 09/27/12 0605 09/26/12 0435 09/25/12 0600  WBC 8.7 8.3 9.1 11.8* 12.1*  NEUTROABS -- -- -- -- --  HGB 8.6* 8.2* 8.3* 8.4* 9.0*  HCT 25.1* 24.2* 24.2* 24.6* 26.4*  MCV 90.6 90.0 89.6 90.4 90.7  PLT 291 235 198 150 136*   Cardiac Enzymes: No results found for this basename: CKTOTAL:5,CKMB:5,CKMBINDEX:5,TROPONINI:5 in the last 168 hours CBG:  Lab 09/24/12 1508  GLUCAP 123*    Studies:  Recent x-ray studies have been reviewed in detail by the Attending Physician  Scheduled Meds:  Reviewed in detail by the Attending Physician   Kathlen Mody [phone]  If 7PM-7AM, please contact night-coverage [email or web address] Password [email or web address] 09/29/2012, 5:39 PM   LOS: 9 days

## 2012-09-29 NOTE — Progress Notes (Signed)
Patient ID: [name], female   DOB: 06-02-1931, 79 y.o.   MRN: [id number] Patient is status post left hip hemiarthroplasty. She is comfortable this morning. Plan for discharge to skilled nursing facility.

## 2012-09-29 NOTE — Progress Notes (Signed)
   Primary cardiologist: Dr. Rollene Rotunda  Subjective:   Denies chest pain or palpitations.   Objective:   Temp:  [97.9 F (36.6 C)-99.2 F (37.3 C)] 99.2 F (37.3 C) (01/11 0340) Pulse Rate:  [81-92] 82  (01/11 0340) Resp:  [16-18] 18  (01/11 0400) BP: (95-120)/(50-61) 120/54 mmHg (01/11 0340) SpO2:  [16 %-98 %] 16 % (01/11 0400) Last BM Date: 09/28/12  Filed Weights   09/25/12 0331 09/26/12 0424 09/27/12 0451  Weight: 114 lb 10.2 oz (52 kg) 122 lb 9.2 oz (55.6 kg) 123 lb 7.3 oz (56 kg)    Intake/Output Summary (Last 24 hours) at 09/29/12 1000 Last data filed at 09/29/12 0645  Gross per 24 hour  Intake    768 ml  Output   1050 ml  Net   -282 ml   Telemetry: Rate controlled atrial fibrillation.  Exam:  General: NAD.   HEENT: Facial ecchymoses.  Lungs: Decreased BS but clear.  Cardiac: Irregularly irregular.  Extremities: No pitting.  Lab Results:  Basic Metabolic Panel:  Lab 09/27/12 1610 09/25/12 0600 09/24/12 0455  NA 138 140 136  K 3.9 4.0 3.4*  CL 105 103 103  CO2 24 30 30   GLUCOSE 98 110* 102*  BUN 17 18 20   CREATININE 0.52 0.61 0.78  CALCIUM 8.6 9.3 8.5  MG -- -- --    CBC:  Lab 09/29/12 0500 09/28/12 0515 09/27/12 0605  WBC 8.7 8.3 9.1  HGB 8.6* 8.2* 8.3*  HCT 25.1* 24.2* 24.2*  MCV 90.6 90.0 89.6  PLT 291 235 198     Medications:   Scheduled Medications:    . antiseptic oral rinse  15 mL Mouth Rinse BID  . atorvastatin  10 mg Oral q1800  . digoxin  0.125 mg Oral Daily  . diltiazem  240 mg Oral Daily  . feeding supplement  237 mL Oral BID BM  . levETIRAcetam  500 mg Oral BID  . levothyroxine  50 mcg Oral QAC breakfast  . mirtazapine  15 mg Oral QHS  . multivitamin with minerals  1 tablet Oral Q1200  . polycarbophil  625 mg Oral Q1200  . polyethylene glycol  17 g Oral Daily  . senna-docusate  2 tablet Oral Daily  . sodium chloride  3 mL Intravenous Q12H      PRN Medications:  acetaminophen, acetaminophen, albuterol,  alum & mag hydroxide-simeth, bisacodyl, guaiFENesin-dextromethorphan, HYDROcodone-acetaminophen, menthol-cetylpyridinium, morphine injection, ondansetron (ZOFRAN) IV, ondansetron, phenol, RESOURCE THICKENUP CLEAR   Assessment:   1. Chronic atrial fibrillation, continue strategy of heart rate control. Currently on digoxin and diltiazem CD.  2. Status post episode of syncope with subsequent left hip fracture, surgical repair after reversal of Coumadin.  3. Interventricular hemorrhage/subdural hematoma, now off anticoagulation. Will need clearance from Neurology prior to considering resumption of anticoagulation for atrial fibrillation with history of stroke.  4. Seizure disorder.  Plan/Discussion:    Discussed with patient and husband at bedside. Atrial fibrillation is rate controlled on current regimen. She is not being anticoagulated at this time as noted above, and would need clearance by neurology prior to considering resumption of anticoagulation. When patient is discharged, please notify our service to help arrange an outpatient cardiac monitor and subsequent followup with Dr. Antoine Poche.   Jonelle Sidle, M.D., F.A.C.C.

## 2012-09-30 LAB — VITAMIN D 1,25 DIHYDROXY
Vitamin D2 1, 25 (OH)2: <8 pg/mL
Vitamin D3 1, 25 (OH)2: 79 pg/mL

## 2012-09-30 LAB — CBC
HCT: 27.6 % — ABNORMAL LOW (ref 36.0–46.0)
Hemoglobin: 9.4 g/dL — ABNORMAL LOW (ref 12.0–15.0)
MCH: 31 pg (ref 26.0–34.0)
MCHC: 34.1 g/dL (ref 30.0–36.0)
RDW: 13.6 % (ref 11.5–15.5)

## 2012-09-30 NOTE — Progress Notes (Signed)
Spoke to Dr. Blake Divine regarding SBP 90s/ 40-50. Ok to [employer]. Will monitor. Mamie Levers

## 2012-09-30 NOTE — Progress Notes (Signed)
   Primary cardiologist: Dr. Rollene Rotunda  Subjective:   States her appetite is limited, feels weak. No palpitations or chest pain.   Objective:   Temp:  [98.3 F (36.8 C)-99.8 F (37.7 C)] 98.4 F (36.9 C) (01/12 0439) Pulse Rate:  [67-90] 67  (01/12 0439) Resp:  [16-18] 18  (01/12 0439) BP: (96-119)/(50-58) 108/58 mmHg (01/12 0439) SpO2:  [91 %-98 %] 91 % (01/12 0439) Last BM Date: 09/28/12  Filed Weights   09/25/12 0331 09/26/12 0424 09/27/12 0451  Weight: 114 lb 10.2 oz (52 kg) 122 lb 9.2 oz (55.6 kg) 123 lb 7.3 oz (56 kg)    Intake/Output Summary (Last 24 hours) at 09/30/12 0759 Last data filed at 09/29/12 1319  Gross per 24 hour  Intake    240 ml  Output    400 ml  Net   -160 ml   Telemetry: Rate controlled atrial fibrillation.  Exam:  General: NAD.   HEENT: Facial ecchymoses.  Lungs: Decreased BS but clear.  Cardiac: Irregularly irregular.  Extremities: No pitting.  Lab Results:  Basic Metabolic Panel:  Lab 09/27/12 1610 09/25/12 0600 09/24/12 0455  NA 138 140 136  K 3.9 4.0 3.4*  CL 105 103 103  CO2 24 30 30   GLUCOSE 98 110* 102*  BUN 17 18 20   CREATININE 0.52 0.61 0.78  CALCIUM 8.6 9.3 8.5  MG -- -- --    CBC:  Lab 09/30/12 0530 09/29/12 0500 09/28/12 0515  WBC 9.8 8.7 8.3  HGB 9.4* 8.6* 8.2*  HCT 27.6* 25.1* 24.2*  MCV 91.1 90.6 90.0  PLT 345 291 235     Medications:   Scheduled Medications:    . antiseptic oral rinse  15 mL Mouth Rinse BID  . atorvastatin  10 mg Oral q1800  . digoxin  0.125 mg Oral Daily  . diltiazem  240 mg Oral Daily  . feeding supplement  237 mL Oral BID BM  . levETIRAcetam  500 mg Oral BID  . levothyroxine  50 mcg Oral QAC breakfast  . mirtazapine  15 mg Oral QHS  . multivitamin with minerals  1 tablet Oral Q1200  . polycarbophil  625 mg Oral Q1200  . polyethylene glycol  17 g Oral Daily  . senna-docusate  2 tablet Oral Daily  . sodium chloride  3 mL Intravenous Q12H     PRN  Medications: acetaminophen, acetaminophen, albuterol, ALPRAZolam, alum & mag hydroxide-simeth, bisacodyl, guaiFENesin-dextromethorphan, HYDROcodone-acetaminophen, menthol-cetylpyridinium, morphine injection, ondansetron (ZOFRAN) IV, ondansetron, phenol, RESOURCE THICKENUP CLEAR   Assessment:   1. Chronic atrial fibrillation, continue strategy of heart rate control. Currently on digoxin and diltiazem CD.  2. Status post episode of syncope with subsequent left hip fracture, surgical repair after reversal of Coumadin.  3. Interventricular hemorrhage/subdural hematoma, now off anticoagulation. Will need clearance from Neurology prior to considering resumption of anticoagulation for atrial fibrillation with history of stroke.  4. Seizure disorder.  Plan/Discussion:    Heart rate is adequately controlled on current regimen. She is not being anticoagulated at this time as noted above, and would need clearance by neurology prior to considering resumption of anticoagulation. When patient is discharged, please notify our service to help arrange an outpatient cardiac monitor and subsequent followup with Dr. Antoine Poche.   Jonelle Sidle, M.D., F.A.C.C.

## 2012-09-30 NOTE — Progress Notes (Signed)
[facility] Progress Note    [name] AVW:[id number] DOB: [birth year] DOA: 09/20/2012 PCP: [name], MD  Brief narrative: 79 y.o. female who was admitted on 09/20/2012 after standing at the sink and turning quickly then subsequently falling to the floor with possible syncope. H and P states she felt a warmth/flushing and passed out onto her left side  Her husband was present, and denied seeing any seizure-like activity. She sustained left hip fracture for which she had surgery on 09/21/2012 after reversal of her coumadin (for atrial fibrillation).  On 09/24/2012 she was at a bedside commode (consitpated) and had an episode of unresponsiveness, respirations slowed, heart rate increased to 160s, SBP 96. Code started, but patient returned to baseline once back to bed, < 1 minute. Of note, she had taken 2 vicodin about 1 hour prior to this event. She was taken to MRI which showed acute/subacute hemorrhage within the choroid of the L lateral ventricle with a small amount of intraventricular blood layering dependently - also a small amount of subdural blood along both convexities L>R, but only a few millimeters in thickness.   Assessment/Plan:  Syncope  Initial episode at home Second episode while on commode in hospital may have been vasovagal in addition to the Vicodin she had received 1 hr before.  Cont tele - out-patient event monitor to be arranged at time of d/c per Cardiology (Dr. Jens Som)  Hip fracture s/p L Bipolar arthroplasty  TED hose, SCDs - no pharmacologic anticoagulation secondary to above - Ortho following  Pt recommending SNF- Pt does not want to go to SNF for rehab- unsure of plan at this time, patient does not want [place] to speak to the children regarding disposition.  Her husband is at bedside and agrees with the plan for discharge to SNF.   Left intraventricular hemorrhage/subdural hematoma 09/24/2012 (not present on CT head at admit 09/20/2012) Per Neuro "based on  imaging hemorrhage likely spontaneous or possibly late and related to previous injury" - Coumadin reversed - repeat CT brain 09/25/2012 confirmed stability of bleed - I suspect it was from the original syncopal episode -  Per Dr. [name], patient may be up with physical therapy - holding further anticoagulation until cleared for same by Neuro  Urinary retention Patient required Foley catheter replacement 09/25/2012 - post Foley insertion returned greater than a liter of urine collected - would not discontinue current foley until more mobile and able to participate in voiding trial   Chronic Atrial fibrillation/flutter w/ RVR -Rate controlled presently on dig and cardizem PO - Cardiology has seen  - no anticoagulation at this time as stated above  Anemia postop  -Hgb stable at 9.4 - guaiac stools negative for blood.  - anemia panel shows a ferritin of 291 , normal b12 and folate levels.   Hypotension BP marginal - pt with little intake - encouraged diet - follow trend -  IVF being given at 100 cc/hr- will decrease to 50 cc/hr and encourage PO liquids but since she is on thick liquids, she may not drink much-   Constipation Resolved. On stool softners and miralax  Osteopenia Takes calcium- check Vit D levels  Seizure disorder Cont Keppra  Hypothyroidism cont synthroid - TSH at goal  History of CVA (cerebrovascular accident)  R MCA CVA >> received tPA (05/2011) - left her with left sided weakness  Code Status: FULL Family Communication: spoke w/ pt and husband at bedside  Disposition Plan: SNF vs/ CIR?? -   Consultants: Neuro  Ortho Cardiology  Antibiotics: none  DVT prophylaxis: SCDs only   HPI/Subjective: The patient is alert and responsive laying in bed- appears quite weak- no other complaints.    Objective: Blood pressure 108/58, pulse 67, temperature 98.4 F (36.9 C), temperature source Oral, resp. rate 18, height 5\' 8"  (1.727 m), weight 56 kg (123 lb 7.3 oz), SpO2  91.00%.  Intake/Output Summary (Last 24 hours) at 09/30/12 1027 Last data filed at 09/29/12 1319  Gross per 24 hour  Intake    120 ml  Output    400 ml  Net   -280 ml     Exam: General: oriented x 3 - No  distress - extensive bruising about the face greatest on left with ping pong ball sized hematoma beneath the left eye without evidence of extending erythema or calor Lungs: Clear to auscultation bilaterally without wheezes or crackles Cardiovascular: Regular rate without appreciable gallop or murmur Abdomen: Nontender, nondistended, soft, bowel sounds positive, no rebound, no ascites, no appreciable mass Extremities: No significant cyanosis, clubbing, or edema bilateral lower extremities  Data Reviewed: Basic Metabolic Panel:  Lab 09/27/12 9528 09/25/12 0600 09/24/12 0455  NA 138 140 136  K 3.9 4.0 3.4*  CL 105 103 103  CO2 24 30 30   GLUCOSE 98 110* 102*  BUN 17 18 20   CREATININE 0.52 0.61 0.78  CALCIUM 8.6 9.3 8.5  MG -- -- --  PHOS -- -- --   CBC:  Lab 09/30/12 0530 09/29/12 0500 09/28/12 0515 09/27/12 0605 09/26/12 0435  WBC 9.8 8.7 8.3 9.1 11.8*  NEUTROABS -- -- -- -- --  HGB 9.4* 8.6* 8.2* 8.3* 8.4*  HCT 27.6* 25.1* 24.2* 24.2* 24.6*  MCV 91.1 90.6 90.0 89.6 90.4  PLT 345 291 235 198 150   Cardiac Enzymes: No results found for this basename: CKTOTAL:5,CKMB:5,CKMBINDEX:5,TROPONINI:5 in the last 168 hours CBG:  Lab 09/24/12 1508  GLUCAP 123*    Studies:  Recent x-ray studies have been reviewed in detail by the Attending Physician  Scheduled Meds:  Reviewed in detail by the Attending Physician   Kathlen Mody [phone]  If 7PM-7AM, please contact night-coverage [email or web address] Password [facility] 09/30/2012, 10:27 AM   LOS: 10 days

## 2012-09-30 NOTE — Progress Notes (Signed)
Assisted back to bed. Call bell and husband near. [name]

## 2012-09-30 NOTE — Progress Notes (Signed)
Patient bathed with assistance. Gown and linens changed per nurse tech. Assisted to chair using stand and sit.  Patient denies any pain or distress. Call bell and family near. Medications given per orders. [name]

## 2012-10-01 LAB — CBC
MCH: 30.5 pg (ref 26.0–34.0)
MCHC: 33.2 g/dL (ref 30.0–36.0)
MCV: 91.9 fL (ref 78.0–100.0)
Platelets: 374 10*3/uL (ref 150–400)
RBC: 3.08 MIL/uL — ABNORMAL LOW (ref 3.87–5.11)
RDW: 14.1 % (ref 11.5–15.5)

## 2012-10-01 MED ORDER — HYDROCODONE-ACETAMINOPHEN 5-325 MG PO TABS: 1.0000 | ORAL_TABLET | Freq: Four times a day (QID) | ORAL | Status: DC | PRN

## 2012-10-01 MED ORDER — GUAIFENESIN-DM 100-10 MG/5ML PO SYRP: 5.0000 mL | ORAL_SOLUTION | ORAL | Status: DC | PRN

## 2012-10-01 MED ORDER — DILTIAZEM HCL ER COATED BEADS 120 MG PO CP24
120.0000 mg | ORAL_CAPSULE | Freq: Every day | ORAL | Status: DC
  Administered 2012-10-01: 120 mg via ORAL
  Filled 2012-10-01: qty 1

## 2012-10-01 MED ORDER — ALBUTEROL SULFATE (5 MG/ML) 0.5% IN NEBU: 2.5000 mg | INHALATION_SOLUTION | RESPIRATORY_TRACT | Status: DC | PRN

## 2012-10-01 MED ORDER — ALPRAZOLAM 0.5 MG PO TABS: 0.5000 mg | ORAL_TABLET | Freq: Two times a day (BID) | ORAL | Status: DC | PRN

## 2012-10-01 MED ORDER — DILTIAZEM HCL ER COATED BEADS 120 MG PO CP24: 120.0000 mg | ORAL_CAPSULE | Freq: Every day | ORAL | Status: DC

## 2012-10-01 MED ORDER — DIGOXIN 125 MCG PO TABS: 0.1250 mg | ORAL_TABLET | Freq: Every day | ORAL | Status: DC

## 2012-10-01 MED ORDER — ENSURE COMPLETE PO LIQD: 237.0000 mL | Freq: Two times a day (BID) | ORAL | Status: DC

## 2012-10-01 MED ORDER — RESOURCE THICKENUP CLEAR PO POWD: ORAL | Status: DC

## 2012-10-01 NOTE — Progress Notes (Signed)
Speech Language Pathology Dysphagia Treatment Patient Details Name: [name] MRN: [id number] DOB: 16-Dec-1930 Today's Date: 10/01/2012 Time: 5621-3086 SLP Time Calculation (min): 19 min  Assessment / Plan / Recommendation Clinical Impression  Pt. seen for dysphagia treatment with RN and husband at bedside.  Consumed nectar thick juice with moderate verbal cues for multiple swallows 70% of the time (30% multiple swallows volitional vs. reflexive).  Delayed throat clearing present suspected to be reflexive 70% of the time.  Observed pt. taking pills whole with nectar (for assessment of upgrading recs for med administration).  Smaller pills pt. was able to manipulate and transit to posterior oral cavity without difficulty.  Pt. was unable to transfer a larger vitamin pill whole in nectar and applesauce and will need to have her larger pills continued to be crushed.  Pt. likely transferring to SNF today.  Recommend continue Dys 2 texture (minced texture) and nectar thick liquids with continued ST and recommend outpatient MBS when appropriate.       Diet Recommendation  Continue with Current Diet: Dysphagia 2 (fine chop);Nectar-thick liquid    SLP Plan Continue with current plan of care       Swallowing Goals  SLP Swallowing Goals Patient will consume recommended diet without observed clinical signs of aspiration with: Moderate assistance Swallow Study Goal #1 - Progress: Progressing toward goal Patient will utilize recommended strategies during swallow to increase swallowing safety with: Minimal assistance Swallow Study Goal #2 - Progress: Progressing toward goal Goal #3: Pt will reduce signs of aspiration with minimal assistance and cueing for compensatory strategies and appropriate posture.  Swallow Study Goal #3 - Progress: Progressing toward goal  General Temperature Spikes Noted: No Respiratory Status: Room air Behavior/Cognition: Alert;Cooperative;Requires cueing Oral Cavity -  Dentition: Adequate natural dentition Patient Positioning: Upright in bed  Oral Cavity - Oral Hygiene Does patient have any of the following "at risk" factors?: Diet - patient on thickened liquids;Other - dysphagia Brush patient's teeth BID with toothbrush (using toothpaste with fluoride): Yes Patient is HIGH RISK - Oral Care Protocol followed (see row info): Yes   Dysphagia Treatment Treatment focused on: Skilled observation of diet tolerance;Patient/family/caregiver education Treatment Methods/Modalities: Skilled observation Patient observed directly with PO's: Yes Type of PO's observed: Nectar-thick liquids Feeding: Able to feed self Liquids provided via: Cup;No straw Pharyngeal Phase Signs & Symptoms: Suspected delayed swallow initiation;Multiple swallows;Delayed throat clear Type of cueing: Verbal;Visual Amount of cueing: Moderate       Breck Coons [employer].Ed [employer] [phone]  10/01/2012

## 2012-10-01 NOTE — Progress Notes (Addendum)
Subjective: 10 Days Post-Op Procedure(s) (LRB): ARTHROPLASTY BIPOLAR HIP (Left) Awake, alert and Ox4. Min pain. Urinary foley still in place Need to d/c as soon as possible to decrease risk of UTI or Seeding to hemiarthroplasty. Patient reports pain as mild.    Objective:   VITALS:  Temp:  [98 F (36.7 C)-98.6 F (37 C)] 98 F (36.7 C) (01/13 0402) Pulse Rate:  [82-95] 87  (01/13 0402) Resp:  [16-20] 16  (01/13 1156) BP: (99-109)/(45-65) 106/56 mmHg (01/13 0402) SpO2:  [96 %-100 %] 96 % (01/13 1156)  Neurologically intact ABD soft Neurovascular intact Sensation intact distally Intact pulses distally Dorsiflexion/Plantar flexion intact Incision: no drainage No cellulitis present   LABS  Basename 10/01/12 0615 09/30/12 0530 09/29/12 0500  HGB 9.4* 9.4* 8.6*  WBC 10.5 9.8 --  PLT 374 345 --   No results found for this basename: NA:2,K:2,CL:2,CO2:2,BUN:2,CREATININE:2,GLUCOSE:2, in the last 72 hours No results found for this basename: LABPT:2,INR:2 in the last 72 hours   Assessment/Plan: 10 Days Post-Op Procedure(s) (LRB): ARTHROPLASTY BIPOLAR HIP (Left)  D/C IV fluids Discharge to SNF Please remove foley catheter as soon as is medically possible. Stable from Ortho stand point and ready to go to SNF. Return to my office in 2-3 weeks for follow up. No anticoagulation due to recent intraventricular bleed and subdural hematoma.  NITKA,JAMES E 10/01/2012, 12:41 PM

## 2012-10-01 NOTE — Discharge Summary (Signed)
Physician Discharge Summary  [name] WJX:[id number] DOB: 1930-10-30 DOA: 09/20/2012  PCP: [name], MD  Admit date: 09/20/2012 Discharge date: 10/01/2012    Recommendations for Outpatient Follow-up:  1. Follow up with neurology for restarting anticoagulation at a later time.  2. Follow up with cardiology for holter monitor.  3. Follow up with ortho as recommended.  4. Follow up with PCP in one week.   Discharge Diagnoses:  Principal Problem:  *Hip fracture Active Problems:  Atrial fibrillation  CVA (cerebral infarction)  History of CVA (cerebrovascular accident)  Fall  Hypothyroidism  HTN (hypertension)  Seizure  Syncope  Anemia- post op  IVH (intraventricular hemorrhage)  Urinary retention   Discharge Condition: stable  Diet recommendation: Diet: Dysphagia 2 (fine chop);Nectar-thick liquid    Filed Weights   09/25/12 0331 09/26/12 0424 09/27/12 0451  Weight: 52 kg (114 lb 10.2 oz) 55.6 kg (122 lb 9.2 oz) 56 kg (123 lb 7.3 oz)    History of present illness:  79 y.o. female who was admitted on 09/20/2012 after standing at the sink and turning quickly then subsequently falling to the floor with possible syncope.  H and P states she felt a warmth/flushing and passed out onto her left side Her husband was present, and denied seeing any seizure-like activity. She sustained left hip fracture for which she had surgery on 09/21/2012 after reversal of her coumadin (for atrial fibrillation).  On 09/24/2012 she was at a bedside commode (consitpated) and had an episode of unresponsiveness, respirations slowed, heart rate increased to 160s, SBP 96. Code started, but patient returned to baseline once back to bed, < 1 minute. Of note, she had taken 2 vicodin about 1 hour prior to this event. She was taken to MRI which showed acute/subacute hemorrhage within the choroid of the L lateral ventricle with a small amount of intraventricular blood layering dependently - also a small  amount of subdural blood along both convexities L>R, but only a few millimeters in thickness.    Hospital Course:  Syncope  Initial episode at home  Second episode while on commode in hospital may have been vasovagal in addition to the Vicodin she had received 1 hr before.  Cont tele - out-patient event monitor to be arranged at time of d/c per Cardiology (Dr. Jens Som) .  Hip fracture s/p L Bipolar arthroplasty  TED hose, SCDs - no pharmacologic anticoagulation secondary to IV bleed. Pt recommending SNF- will d.c her today Her husband is at bedside and agrees with the plan for discharge to SNF.  Left intraventricular hemorrhage/subdural hematoma 09/24/2012 (not present on CT head at admit 09/20/2012)  Per Neuro "based on imaging hemorrhage likely spontaneous or possibly late and related to previous injury" - Coumadin reversed - repeat CT brain 09/25/2012 confirmed stability of bleed - I suspect it was from the original syncopal episode  - Per Dr. [name], patient may be up with physical therapy - holding further anticoagulation until cleared for same by Neuro as outpatient.  Urinary retention  Patient required Foley catheter replacement 09/25/2012 - post Foley insertion returned greater than a liter of urine collected - Would d/c foley catheter and do a voiding trial.  Chronic Atrial fibrillation/flutter w/ RVR  -Rate controlled presently on dig and cardizem PO  - Cardiology has seen  - no anticoagulation at this time as stated above  Anemia postop  -Hgb stable at 9.4  - guaiac stools negative for blood.  - anemia panel shows a ferritin of 291 ,  normal b12 and folate levels.  Hypotension  BP marginal - pt with little intake - encouraged diet and PO liquids but since she is on thick liquids, she may not drink much-  Constipation  Resolved. On stool softners and miralax  Osteopenia  Takes calcium- check Vit D levels as outpatient. Seizure disorder  Cont Keppra  Hypothyroidism  cont  synthroid - TSH at goal  History of CVA (cerebrovascular accident)  R MCA CVA >> received tPA (05/2011) - left her with left sided weakness  Consultations: Neuro  Ortho  Cardiology   Discharge Exam: Filed Vitals:   10/01/12 0000 10/01/12 0402 10/01/12 0800 10/01/12 1156  BP:  106/56    Pulse:  87    Temp:  98 F (36.7 C)    TempSrc:  Oral    Resp: 20 18 20 16   Height:      Weight:      SpO2: 99% 97% 98% 96%  General: oriented x 3 - No distress - extensive bruising about the face greatest on left with ping pong ball sized hematoma beneath the left eye without evidence of extending erythema or calor  Lungs: Clear to auscultation bilaterally without wheezes or crackles  Cardiovascular: Regular rate without appreciable gallop or murmur  Abdomen: Nontender, nondistended, soft, bowel sounds positive, no rebound, no ascites, no appreciable mass  Extremities: No significant cyanosis, clubbing, or edema bilateral lower extremities    Discharge Instructions      Discharge Orders    Future Orders Please Complete By Expires   Diet - low sodium heart healthy      Full weight bearing      Weight bearing as tolerated      Discharge instructions      Comments:   Follow up with NEUROLOGY in 1 to 2 weeks. Follow up with Cardiology in 2 weeks.  Follow up with PCP in one week.   Activity as tolerated - No restrictions          Medication List     As of 10/01/2012 12:17 PM    STOP taking these medications         warfarin 5 MG tablet   Commonly known as: COUMADIN      TAKE these medications         albuterol (5 MG/ML) 0.5% nebulizer solution   Commonly known as: PROVENTIL   Take 0.5 mLs (2.5 mg total) by nebulization every 2 (two) hours as needed for wheezing.      ALPRAZolam 0.5 MG tablet   Commonly known as: XANAX   Take 1 tablet (0.5 mg total) by mouth 2 (two) times daily as needed for anxiety.      atorvastatin 10 MG tablet   Commonly known as: LIPITOR   Take 1  tablet (10 mg total) by mouth daily at 6 PM.      CALTRATE 600 PO   Take 1 tablet by mouth daily at 12 noon.      diltiazem 120 MG 24 hr capsule   Commonly known as: CARDIZEM CD   Take 1 capsule (120 mg total) by mouth daily.      feeding supplement Liqd   Take 237 mLs by mouth 2 (two) times daily between meals.      guaiFENesin-dextromethorphan 100-10 MG/5ML syrup   Commonly known as: ROBITUSSIN DM   Take 5 mLs by mouth every 4 (four) hours as needed for cough.      HYDROcodone-acetaminophen 5-325 MG per tablet  Commonly known as: NORCO/VICODIN   Take 1-2 tablets by mouth every 6 (six) hours as needed.      levETIRAcetam 500 MG tablet   Commonly known as: KEPPRA   Take 1 tablet (500 mg total) by mouth 2 (two) times daily.      levothyroxine 50 MCG tablet   Commonly known as: SYNTHROID, LEVOTHROID   Take 50 mcg by mouth every morning.      mirtazapine 15 MG tablet   Commonly known as: REMERON   Take 15 mg by mouth at bedtime.      multivitamin with minerals Tabs   Take 1 tablet by mouth daily at 12 noon.      polycarbophil 625 MG tablet   Commonly known as: FIBERCON   Take 625 mg by mouth daily at 12 noon.      polyethylene glycol powder powder   Commonly known as: GLYCOLAX/MIRALAX   Take 17 g by mouth every morning.      RESOURCE THICKENUP CLEAR Powd   As needed.      STOOL SOFTENER PO   Take 1 capsule by mouth at bedtime.        Follow-up Information    Follow up with NITKA,JAMES E, MD. In 2 weeks.   Contact information:   [address] [place] [address] [phone]           The results of significant diagnostics from this hospitalization (including imaging, microbiology, ancillary and laboratory) are listed below for reference.    Significant Diagnostic Studies: Dg Elbow 2 Views Left  09/20/2012  *RADIOLOGY REPORT*  Clinical Data: Fall  LEFT ELBOW - 2 VIEW  Comparison: None.  Findings: No acute fracture.  No dislocation.  No obvious  joint effusion.  Osteopenia.  IMPRESSION: No acute bony pathology.   Original Report Authenticated By: Jolaine Click, M.D.    Dg Wrist 2 Views Left  09/20/2012  *RADIOLOGY REPORT*  Clinical Data: Left arm pain with abrasions at left elbow  LEFT WRIST - 2 VIEW  Comparison: Concurrently obtained radiographs of the shoulder and elbow  Findings: The bones are diffusely osteopenic which can limit evaluation for nondisplaced fracture.  AP and lateral views of the wrist demonstrate no acute fracture, or malalignment.  The carpus appears congruent.  Mild soft tissue swelling about the wrist.  IMPRESSION:  1. Mild soft tissue swelling about the wrist without evidence of acute fracture or malalignment.  2.  Advanced osteopenia which can limits the sensitivity for detection of occult fracture.  If clinical concern persists for fracture consider repeat imaging in 10 - 14 days as occult fracture may become more evident secondary to bony resorption and callus formation.   Original Report Authenticated By: Malachy Moan, M.D.    Dg Hip Complete Left  09/20/2012  *RADIOLOGY REPORT*  Clinical Data: Left hip pain  LEFT HIP - COMPLETE 2+ VIEW  Comparison: None.  Findings: No acute displaced transcervical fracture through the mid left femoral neck.  The distal fracture fragment is displaced superiorly with respect to the femoral head by one half shaft width.  The remainder of the bony pelvis appears intact.  The femoral head is located on cross-table lateral view.  Unremarkable bowel gas pattern.  IMPRESSION:  Acute transcervical fracture of the left femoral neck.  The femur is displaced superiorly by one half shaft width with respect to the femoral head.   Original Report Authenticated By: Malachy Moan, M.D.    Ct Head Wo Contrast  09/25/2012  *RADIOLOGY REPORT*  Clinical Data: Stroke.  Headache and dizziness.  Follow up intracranial hemorrhage  CT HEAD WITHOUT CONTRAST  Technique:  Contiguous axial images were obtained  from the base of the skull through the vertex without contrast.  Comparison: CT head 09/20/2012, MRI 09/24/2012  Findings: Intraventricular hemorrhage layering in the occipital horns is similar to the recent MRI and is difficult to see on the CT 09/20/2012.  Small subdural hemorrhage   noted on the MRI is difficult to see on the current CT scan.  Chronic right MCA infarct is unchanged.  Chronic ischemia in the right internal capsule is unchanged.  Negative for acute infarct. Negative for mass lesion.  Negative for skull fracture.  IMPRESSION: Mild intraventricular hemorrhage is unchanged from 09/24/2012.  No hydrocephalus.  Chronic right MCA infarct unchanged.  No acute infarct.   Original Report Authenticated By: Janeece Riggers, M.D.    Ct Head Wo Contrast  09/20/2012  *RADIOLOGY REPORT*  Clinical Data:  Syncopal episode resulting in a fall, hitting her head with subsequent headache.  CT HEAD WITHOUT CONTRAST CT MAXILLOFACIAL WITHOUT CONTRAST CT CERVICAL SPINE WITHOUT CONTRAST  Technique:  Multidetector CT imaging of the head, cervical spine, and maxillofacial structures were performed using the standard protocol without intravenous contrast. Multiplanar CT image reconstructions of the cervical spine and maxillofacial structures were also generated.  Comparison:  Previous examinations.  CT HEAD  Findings: No significant change in an old left middle cerebral artery distribution infarct with associated midline shift to the right.  An old right caudate body lacunar infarct is again demonstrated.  There is a left facial laceration and associated soft tissue blood and air.  No skull fracture, intracranial hemorrhage or paranasal sinus air-fluid levels are seen.  There is minimal left maxillary sinus mucosal thickening.  IMPRESSION:  1.  No skull fracture or intracranial hemorrhage. 2.  Stable atrophy and old infarcts. 3.  Left facial laceration.  CT MAXILLOFACIAL  Findings:  Left facial soft tissue laceration with  associated blood and air in the subcutaneous fat.  No fractures or paranasal sinus air-fluid levels.  IMPRESSION: Left facial soft tissue laceration without fracture.  CT CERVICAL SPINE  Findings:   Multilevel degenerative changes are stable.  No prevertebral soft tissue swelling, fractures or subluxations. Stable right apical pleural thickening and surgical staples.  IMPRESSION:  1.  No fracture or subluxation. 2.  Stable degenerative changes.   Original Report Authenticated By: Beckie Salts, M.D.    Ct Cervical Spine Wo Contrast  09/20/2012  *RADIOLOGY REPORT*  Clinical Data:  Syncopal episode resulting in a fall, hitting her head with subsequent headache.  CT HEAD WITHOUT CONTRAST CT MAXILLOFACIAL WITHOUT CONTRAST CT CERVICAL SPINE WITHOUT CONTRAST  Technique:  Multidetector CT imaging of the head, cervical spine, and maxillofacial structures were performed using the standard protocol without intravenous contrast. Multiplanar CT image reconstructions of the cervical spine and maxillofacial structures were also generated.  Comparison:  Previous examinations.  CT HEAD  Findings: No significant change in an old left middle cerebral artery distribution infarct with associated midline shift to the right.  An old right caudate body lacunar infarct is again demonstrated.  There is a left facial laceration and associated soft tissue blood and air.  No skull fracture, intracranial hemorrhage or paranasal sinus air-fluid levels are seen.  There is minimal left maxillary sinus mucosal thickening.  IMPRESSION:  1.  No skull fracture or intracranial hemorrhage. 2.  Stable atrophy and old infarcts. 3.  Left  facial laceration.  CT MAXILLOFACIAL  Findings:  Left facial soft tissue laceration with associated blood and air in the subcutaneous fat.  No fractures or paranasal sinus air-fluid levels.  IMPRESSION: Left facial soft tissue laceration without fracture.  CT CERVICAL SPINE  Findings:   Multilevel degenerative changes are  stable.  No prevertebral soft tissue swelling, fractures or subluxations. Stable right apical pleural thickening and surgical staples.  IMPRESSION:  1.  No fracture or subluxation. 2.  Stable degenerative changes.   Original Report Authenticated By: Beckie Salts, M.D.    Mr Brain Wo Contrast  09/24/2012  *RADIOLOGY REPORT*  Clinical Data: Loss of consciousness with trauma to the head.  MRI HEAD WITHOUT CONTRAST  Technique:  Multiplanar, multiecho pulse sequences of the brain and surrounding structures were obtained according to standard protocol without intravenous contrast.  Comparison: CT 09/20/2012.  MRI 11/15/2011  Findings: Diffusion imaging is negative for acute or subacute infarction.  There is an old infarction in the right middle cerebral artery territory affecting the temporal lobe, insula and frontoparietal region which has progressed atrophy, encephalomalacia and adjacent gliosis.  There is some old hemosiderin deposition.  There is some intraventricular hemorrhage layering dependently . There is some acute or subacute hemorrhage apparently in the substance of the chorioid in the left lateral ventricle.  This appeared normal on the previous study and therefore I would doubt the presence of a vascular malformation.  I think there is a very small amount of subdural hematoma on the left and on the right, only a few millimeters in thickness.  No hydrocephalus.  No mass lesion.  Left facial hematoma again noted.  IMPRESSION: No acute infarction.  Acute/subacute hemorrhage within the choroid of the left lateral ventricle with a small amount of intraventricular blood layering dependently.  Small amount of subdural blood along both convexities left more than right, but only a few millimeters in thickness.   Original Report Authenticated By: Paulina Fusi, M.D.    Dg Pelvis Portable  09/21/2012  *RADIOLOGY REPORT*  Clinical Data: Left hip replacement  PORTABLE PELVIS  Comparison: 09/21/2011  Findings: Bipolar  hip replacement on the left has good appearance. No radiographically detectable complication.  Soft tissue drain is in place.  IMPRESSION: Good appearance following bipolar hip replacement on the left.   Original Report Authenticated By: Paulina Fusi, M.D.    Dg Chest Port 1 View  09/20/2012  *RADIOLOGY REPORT*  Clinical Data: Syncope resulting in a fall.  PORTABLE CHEST - 1 VIEW  Comparison: 11/15/2011.  Findings: Stable enlarged cardiac silhouette and hyperexpanded lungs.  The lungs remain clear.  Right apical surgical staples and pleural thickening are unchanged.  Mild left apical pleural thickening is again demonstrated.  Mild scoliosis.  IMPRESSION: No acute abnormality.  Stable cardiomegaly and changes of COPD.   Original Report Authenticated By: Beckie Salts, M.D.    Dg Shoulder Left  09/20/2012  *RADIOLOGY REPORT*  Clinical Data: History of fall complaining of left shoulder pain.  LEFT SHOULDER - 2+ VIEW  Comparison: No priors.  Findings: Three views of the left shoulder demonstrate no acute displaced fracture, subluxation, dislocation, joint or soft tissue abnormality.  IMPRESSION: 1.  No acute radiographic abnormality of the left shoulder.   Original Report Authenticated By: Trudie Reed, M.D.    Dg Swallowing Func-speech Pathology  09/26/2012  Riley Nearing Deblois, CCC-SLP     09/26/2012  9:37 AM Objective Swallowing Evaluation: Modified Barium Swallowing Study   Patient Details  Name: Winna Golla Lemelin  MRN: [id number] Date of Birth: 1931/04/21  Today's Date: 09/26/2012 Time: 4098-1191 SLP Time Calculation (min): 33 min  Past Medical History:  Past Medical History  Diagnosis Date  . Dysphagia   . Atrial fibrillation     a. Dx 05/2011 at time of CVA.  Marland Kitchen Hyperlipidemia   . HTN (hypertension)   . Hypothyroidism   . Pneumonia     "alot of pneumonia" (09/21/2012)  . Syncope and collapse     "fell @ [place]; hit left forehead"; left hip fx/notes  09/20/2012  . Vertigo   . Arthritis     "some; all over"  (09/22/2011)  . Anxiety   . Depression   . Seizures      a. 05/2011 right MCA CVA in setting of new onset afib s/p  thrombolysis/thrombolytics. b. Recurrent CVA 10/2011 in setting of  subtherap.INR.    Marland Kitchen Seizure 10/2011    "only one she ever had was 3 months after her stroke" (09/21/2012   . Stroke 05/2011    a. 05/2011 right MCA CVA in setting of new onset afib s/p  thrombolysis/thrombolytics. b. Recurrent CVA 10/2011 in setting of  subtherap.INR.  Marland Kitchen Stroke 05/2011    history of embolic CVA right hemisphere with left  hemiparesis/notes 09/20/2012   Past Surgical History:  Past Surgical History  Procedure Date  . Lumbar disc surgery 1981  . Vaginal hysterectomy 1982  . Pleural scarification 1977?; 1986    "2 lung surgeries; my lungs had collapsed" (09/21/2012)  . Tonsillectomy and adenoidectomy 1950?  Marland Kitchen Appendectomy 1982  . Thrombolysis 05/2011    [name] 09/20/2012  . Hip arthroplasty 09/21/2012    Procedure: ARTHROPLASTY BIPOLAR HIP;  Surgeon: Kerrin Champagne,  MD;  Location: [facility] OR;  Service: Orthopedics;  Laterality: Left;   HPI:  Benisha Hadaway is a 79 y.o. female, with history of atrial  fibrillation and CVA in September of last year, who is on  Coumadin, history of seizures, was in usual state of health  however this morning while admitting in her house she had a  sensation of warmth and flushing following which she passed out  and fell hitting her left hip, of and left forehead to the floor,  she sustained a laceration around her left forehead, started  experiencing pain around her left elbow and left hip. Pt  underwent left hip hemiarthroplasty on 09/22/11 with brief ETT.  PMH: large old right CVA, acute right frontal/temporal infarct  2/13, dysphagia with 3 MBS' between 05/24/11-06/08/11. MBS 06/08/11  revealed flash penetration with thin liquids with recommendation  of DYs 2 thin liquids, double swallows and intermittent throat  clear.      Assessment / Plan / Recommendation Clinical Impression  Dysphagia Diagnosis: Moderate cervical  esophageal phase  dysphagia;Moderate pharyngeal phase dysphagia;Mild oral phase  dysphagia Clinical impression: Pt presents with a moderate baseline  dysphagia that has worsened due to generalized weakness and  decompensation from recent fall. The pt continues to show a mild  oral dysphagia with holding and piecemeal transit (no pocketing  despite left oral weakness). Hyolaryngeal elevation and  pharyngeal contraction are mildy worsened likely from generalized  weakness. Pharyngeal residuals post swallow are moderate to  severe, primarily due to limited opening of cervical esophagus  (present at baseline due to curvature of cervical spine). Thin  liquids are aspirated with a delayed cough response, nectar thick  liquids did not result in any aspiration, though 3-6 swallows are  needed to adequately transit bolus. Pts posture  also very  important given curvature of spine. Pt will have best function if  sitting up in chair with neutral head posture (with pillows and  towels). Recommend downgrade to dys 2 (fine chop) with nectar  thick liquids until pt demonstrates improved strength and  endurance.     Treatment Recommendation  Therapy as outlined in treatment plan below    Diet Recommendation Dysphagia 2 (Fine chop);Thin liquid   Liquid Administration via: Cup;Straw Medication Administration: Crushed with puree Supervision: Full supervision/cueing for compensatory strategies Compensations: Slow rate;Small sips/bites;Multiple dry swallows  after each bite/sip;Follow solids with liquid (3-6 swallows) Postural Changes and/or Swallow Maneuvers: Out of bed for  meals;Seated upright 90 degrees (support head with pillows -  neutral posture. )    Other  Recommendations Oral Care Recommendations: Oral care BID Other Recommendations: Order thickener from pharmacy   Follow Up Recommendations  Inpatient Rehab    Frequency and Duration min 2x/week  2 weeks   NA    SLP Swallow Goals Goal #3: Pt will reduce signs of aspiration with  minimal  assistance and cueing for compensatory strategies and appropriate  posture.    General HPI: Callee Rohrig is a 79 y.o. female, with history of  atrial fibrillation and CVA in September of last year, who is on  Coumadin, history of seizures, was in usual state of health  however this morning while admitting in her house she had a  sensation of warmth and flushing following which she passed out  and fell hitting her left hip, of and left forehead to the floor,  she sustained a laceration around her left forehead, started  experiencing pain around her left elbow and left hip. Pt  underwent left hip hemiarthroplasty on 09/22/11 with brief ETT.  PMH: large old right CVA, acute right frontal/temporal infarct  2/13, dysphagia with 3 MBS' between 05/24/11-06/08/11. MBS 06/08/11  revealed flash penetration with thin liquids with recommendation  of DYs 2 thin liquids, double swallows and intermittent throat  clear.  Type of Study: Modified Barium Swallowing Study Reason for Referral: Objectively evaluate swallowing function Previous Swallow Assessment: see HPI Diet Prior to this Study: Dysphagia 3 (soft);Thin liquids Temperature Spikes Noted: No Respiratory Status: Supplemental O2 delivered via (comment) History of Recent Intubation: Yes Length of Intubations (days): 1 days Date extubated: 09/21/12 Behavior/Cognition: Cooperative;Lethargic;Requires cueing Oral Cavity - Dentition: Adequate natural dentition Oral Motor / Sensory Function: Impaired - see Bedside swallow  eval Self-Feeding Abilities: Needs assist;Able to feed self Patient Positioning: Upright in chair Baseline Vocal Quality: Wet Volitional Cough: Strong Volitional Swallow: Able to elicit Anatomy: Other (Comment) (appearance Kyphotic bend in C spine  impeding opening of UES)    Reason for Referral Objectively evaluate swallowing function   Oral Phase Oral Preparation/Oral Phase Oral Phase: Impaired Oral - Nectar Oral - Nectar Cup: Reduced posterior  propulsion;Holding of  bolus;Piecemeal swallowing;Delayed oral transit Oral - Nectar Straw: Reduced posterior propulsion;Holding of  bolus;Piecemeal swallowing;Delayed oral transit Oral - Thin Oral - Thin Cup: Reduced posterior propulsion;Holding of  bolus;Piecemeal swallowing;Delayed oral transit Oral - Solids Oral - Puree: Reduced posterior propulsion;Holding of  bolus;Piecemeal swallowing;Delayed oral transit Oral - Regular: Reduced posterior propulsion;Holding of  bolus;Piecemeal swallowing;Delayed oral transit   Pharyngeal Phase Pharyngeal Phase Pharyngeal Phase: Impaired Pharyngeal - Nectar Pharyngeal - Nectar Cup: Reduced pharyngeal peristalsis;Reduced  tongue base retraction;Reduced laryngeal elevation;Reduced  anterior laryngeal mobility;Pharyngeal residue -  valleculae;Pharyngeal residue - pyriform sinuses Pharyngeal - Nectar Straw: Reduced pharyngeal peristalsis;Reduced  tongue base retraction;Reduced laryngeal  elevation;Reduced  anterior laryngeal mobility;Pharyngeal residue -  valleculae;Pharyngeal residue - pyriform sinuses Pharyngeal - Thin Pharyngeal - Thin Cup: Reduced pharyngeal peristalsis;Reduced  tongue base retraction;Reduced laryngeal elevation;Reduced  anterior laryngeal mobility;Pharyngeal residue -  valleculae;Pharyngeal residue - pyriform  sinuses;Penetration/Aspiration after swallow;Trace  aspiration;Penetration/Aspiration before swallow Penetration/Aspiration details (thin cup): Material enters  airway, passes BELOW cords then ejected out;Material enters  airway, CONTACTS cords then ejected out;Material enters airway,  remains ABOVE vocal cords and not ejected out Pharyngeal - Solids Pharyngeal - Puree: Reduced pharyngeal peristalsis;Reduced tongue  base retraction;Reduced laryngeal elevation;Reduced anterior  laryngeal mobility;Pharyngeal residue - valleculae;Pharyngeal  residue - pyriform sinuses  Cervical Esophageal Phase    GO    Cervical Esophageal Phase Cervical Esophageal Phase:  Impaired Cervical Esophageal Phase - Comment Cervical Esophageal Comment: Appearance of kyphotic bend in  cervical spine reduing opening of UES with severe residuals  remain post swallow in pharynx despite only mild weakness         Harlon Ditty, MA CCC-SLP [phone]  [place], Riley Nearing 09/26/2012, 9:35 AM     Ct Maxillofacial Wo Cm  09/20/2012  *RADIOLOGY REPORT*  Clinical Data:  Syncopal episode resulting in a fall, hitting her head with subsequent headache.  CT HEAD WITHOUT CONTRAST CT MAXILLOFACIAL WITHOUT CONTRAST CT CERVICAL SPINE WITHOUT CONTRAST  Technique:  Multidetector CT imaging of the head, cervical spine, and maxillofacial structures were performed using the standard protocol without intravenous contrast. Multiplanar CT image reconstructions of the cervical spine and maxillofacial structures were also generated.  Comparison:  Previous examinations.  CT HEAD  Findings: No significant change in an old left middle cerebral artery distribution infarct with associated midline shift to the right.  An old right caudate body lacunar infarct is again demonstrated.  There is a left facial laceration and associated soft tissue blood and air.  No skull fracture, intracranial hemorrhage or paranasal sinus air-fluid levels are seen.  There is minimal left maxillary sinus mucosal thickening.  IMPRESSION:  1.  No skull fracture or intracranial hemorrhage. 2.  Stable atrophy and old infarcts. 3.  Left facial laceration.  CT MAXILLOFACIAL  Findings:  Left facial soft tissue laceration with associated blood and air in the subcutaneous fat.  No fractures or paranasal sinus air-fluid levels.  IMPRESSION: Left facial soft tissue laceration without fracture.  CT CERVICAL SPINE  Findings:   Multilevel degenerative changes are stable.  No prevertebral soft tissue swelling, fractures or subluxations. Stable right apical pleural thickening and surgical staples.  IMPRESSION:  1.  No fracture or subluxation. 2.  Stable  degenerative changes.   Original Report Authenticated By: Beckie Salts, M.D.     Microbiology: No results found for this or any previous visit (from the past 240 hour(s)).   Labs: Basic Metabolic Panel:  Lab 09/27/12 4540 09/25/12 0600  NA 138 140  K 3.9 4.0  CL 105 103  CO2 24 30  GLUCOSE 98 110*  BUN 17 18  CREATININE 0.52 0.61  CALCIUM 8.6 9.3  MG -- --  PHOS -- --   Liver Function Tests: No results found for this basename: AST:5,ALT:5,ALKPHOS:5,BILITOT:5,PROT:5,ALBUMIN:5 in the last 168 hours No results found for this basename: LIPASE:5,AMYLASE:5 in the last 168 hours No results found for this basename: AMMONIA:5 in the last 168 hours CBC:  Lab 10/01/12 0615 09/30/12 0530 09/29/12 0500 09/28/12 0515 09/27/12 0605  WBC 10.5 9.8 8.7 8.3 9.1  NEUTROABS -- -- -- -- --  HGB 9.4* 9.4* 8.6* 8.2* 8.3*  HCT 28.3* 27.6*  25.1* 24.2* 24.2*  MCV 91.9 91.1 90.6 90.0 89.6  PLT 374 345 291 235 198   Cardiac Enzymes: No results found for this basename: CKTOTAL:5,CKMB:5,CKMBINDEX:5,TROPONINI:5 in the last 168 hours BNP: BNP (last 3 results) No results found for this basename: PROBNP:3 in the last 8760 hours CBG:  Lab 09/24/12 1508  GLUCAP 123*       Signed:  Anmol Fleck  [facility] 10/01/2012, 12:17 PM

## 2012-10-01 NOTE — Progress Notes (Signed)
   SUBJECTIVE:  Confused this am.  She denies any chest pain.  No SOB.    PHYSICAL EXAM Filed Vitals:   09/30/12 1418 09/30/12 1942 10/01/12 0000 10/01/12 0402  BP: 99/45 109/65  106/56  Pulse: 82 95  87  Temp: 98.3 F (36.8 C) 98.6 F (37 C)  98 F (36.7 C)  TempSrc: Oral Oral  Oral  Resp: 16 18 20 18   Height:      Weight:      SpO2: 100% 98% 99% 97%   General:  Very frail.  No distress BP 106/56  Pulse 87  Temp 98 F (36.7 C) (Oral)  Resp 18  Ht 5\' 8"  (1.727 m)  Wt 123 lb 7.3 oz (56 kg)  BMI 18.77 kg/m2  SpO2 97% Lungs:  Clear Heart:  Irregular Abdomen:  Positive bowel sounds, no rebound no guarding Extremities:  No edema  LABS:  Results for orders placed during the hospital encounter of 09/20/12 (from the past 24 hour(s))  CBC     Status: Abnormal   Collection Time   10/01/12  6:15 AM      Component Value Range   WBC 10.5  4.0 - 10.5 K/uL   RBC 3.08 (*) 3.87 - 5.11 MIL/uL   Hemoglobin 9.4 (*) 12.0 - 15.0 g/dL   HCT 16.1 (*) 09.6 - 04.5 %   MCV 91.9  78.0 - 100.0 fL   MCH 30.5  26.0 - 34.0 pg   MCHC 33.2  30.0 - 36.0 g/dL   RDW 40.9  81.1 - 91.4 %   Platelets 374  150 - 400 K/uL   No intake or output data in the 24 hours ending 10/01/12 0816  ASSESSMENT AND PLAN:  Atrial fibrillation:  Rate is controlled.  On dig and Cardizem.  Continue current meds.    IVH (intraventricular hemorrhage):  Neuro has signed off.  We will need to consult with them in the future (as an outpatient) to determine when she can restart anticoagulation.  She remains off for now.   Hypotension:  Cardizem held yesterday secondary to low BP.  I will reduce the dose today.  I reviewed telemetry and her HR is controlled.   [name] 10/01/2012 8:16 AM

## 2012-10-02 NOTE — Clinical Social Work Placement (Signed)
Clinical Social Work Department CLINICAL SOCIAL WORK PLACEMENT NOTE 10/02/2012  Patient:  [name], [name]  Account Number:  [id number] Admit date:  09/20/2012  Clinical Social Worker:  Unk Lightning, LCSW  Date/time:  09/24/2012 01:30 PM  Clinical Social Work is seeking post-discharge placement for this patient at the following level of care:   SKILLED NURSING   (*CSW will update this form in Epic as items are completed)   09/24/2012  Patient/family provided with [facility] Department of Clinical Social Work's list of facilities offering this level of care within the geographic area requested by the patient (or if unable, by the patient's family).  09/24/2012  Patient/family informed of their freedom to choose among providers that offer the needed level of care, that participate in Medicare, Medicaid or managed care program needed by the patient, have an available bed and are willing to accept the patient.  09/24/2012  Patient/family informed of [facility]' ownership interest in [facility], as well as of the fact that they are under no obligation to receive care at this facility.  PASARR submitted to EDS on  PASARR number received from EDS on   FL2 transmitted to all facilities in geographic area requested by pt/family on  09/24/2012 FL2 transmitted to all facilities within larger geographic area on   Patient informed that his/her managed care company has contracts with or will negotiate with  certain facilities, including the following:     Patient/family informed of bed offers received:  09/24/2012 Patient chooses bed at [facility] Physician recommends and patient chooses bed at    Patient to be transferred to [facility] on  10/01/2012 Patient to be transferred to facility by [facility]  The following physician request were entered in Epic:   Additional Comments: Pt's husband at bedside. Will meet Pt at facility.   Frederico Hamman, LCSW [phone]

## 2012-10-08 ENCOUNTER — Telehealth: Payer: Self-pay | Admitting: Cardiology

## 2012-10-08 NOTE — Telephone Encounter (Signed)
Will see about placing monitor on pt when she comes in for appt on Friday.

## 2012-10-08 NOTE — Telephone Encounter (Signed)
Per Trisha/Dr. Antoine Poche saw [name] while in the hospital on 10-01-12.  She will need 3 week monitor before appointment with him on 10/12/13.  Have call [name] several times, only getting a message stating Voice Mail is full.  After talking with Pam there is a question if she might be in Rehab do to hip surgery.   Per Pam since patient has upcoming appointment on 10/12/12 we will get monitor then.

## 2012-10-12 ENCOUNTER — Encounter: Payer: Self-pay | Admitting: Cardiology

## 2012-10-12 ENCOUNTER — Ambulatory Visit (INDEPENDENT_AMBULATORY_CARE_PROVIDER_SITE_OTHER): Payer: Medicare Other | Admitting: Cardiology

## 2012-10-12 VITALS — BP 108/62 | HR 82 | Ht 68.0 in | Wt 102.0 lb

## 2012-10-12 DIAGNOSIS — I4891 Unspecified atrial fibrillation: Secondary | ICD-10-CM

## 2012-10-12 NOTE — Patient Instructions (Addendum)
The current medical regimen is effective;  continue present plan and medications.  Follow up with Dr Antoine Poche in 3 months as scheduled

## 2012-10-12 NOTE — Telephone Encounter (Signed)
Pt present for appt - need for monitor will be addressed.

## 2012-10-12 NOTE — Progress Notes (Signed)
HPI The patient presents for followup after an apparent syncopal episode. She had a hip fracture and trauma to her face. While she was in the hospital after hip repair she had an episode of unresponsiveness. MRI demonstrated acute subacute hemorrhagic stroke. She was taken off of Coumadin at that point.  She is now at rehabilitation. She's been quite irritable per her husband. I noticed this in the hospital. This certainly may be residual from her stroke and related to frustration with her deteriorating condition. She's had no acute cardiovascular complaints. She denies chest pressure. She's had no shortness of breath. She is having hip pain.  No Known Allergies  Current Outpatient Prescriptions  Medication Sig Dispense Refill  . albuterol (PROVENTIL) (5 MG/ML) 0.5% nebulizer solution Take 0.5 mLs (2.5 mg total) by nebulization every 2 (two) hours as needed for wheezing.  20 mL  0  . atorvastatin (LIPITOR) 10 MG tablet Take 1 tablet (10 mg total) by mouth daily at 6 PM.  30 tablet  2  . Calcium Carbonate (CALTRATE 600 PO) Take 1 tablet by mouth daily at 12 noon.       . diltiazem (CARDIZEM CD) 120 MG 24 hr capsule Take 1 capsule (120 mg total) by mouth daily.  30 capsule  1  . Docusate Calcium (STOOL SOFTENER PO) Take 1 capsule by mouth at bedtime.       . feeding supplement (ENSURE COMPLETE) LIQD Take 237 mLs by mouth 2 (two) times daily between meals.      . levETIRAcetam (KEPPRA) 500 MG tablet Take 1 tablet (500 mg total) by mouth 2 (two) times daily.  60 tablet  3  . levothyroxine (SYNTHROID, LEVOTHROID) 50 MCG tablet Take 50 mcg by mouth every morning.       . Maltodextrin-Xanthan Gum (RESOURCE THICKENUP CLEAR) POWD As needed.      . Multiple Vitamin (MULITIVITAMIN WITH MINERALS) TABS Take 1 tablet by mouth daily at 12 noon.      . polycarbophil (FIBERCON) 625 MG tablet Take 625 mg by mouth daily at 12 noon.      . digoxin (LANOXIN) 0.125 MG tablet Take 1 tablet (0.125 mg total) by mouth  daily.        Past Medical History  Diagnosis Date  . Dysphagia   . Atrial fibrillation     a. Dx 05/2011 at time of CVA.  Marland Kitchen Hyperlipidemia   . HTN (hypertension)   . Hypothyroidism   . Pneumonia     "alot of pneumonia" (09/21/2012)  . Syncope and collapse     "fell @ [facility]; hit left forehead"; left hip fx/notes 09/20/2012  . Vertigo   . Arthritis     "some; all over" (09/22/2011)  . Anxiety   . Depression   . Seizures      a. 05/2011 right MCA CVA in setting of new onset afib s/p thrombolysis/thrombolytics. b. Recurrent CVA 10/2011 in setting of subtherap.INR.    Marland Kitchen Seizure 10/2011    "only one she ever had was 3 months after her stroke" (09/21/2012  . Stroke 05/2011    a. 05/2011 right MCA CVA in setting of new onset afib s/p thrombolysis/thrombolytics. b. Recurrent CVA 10/2011 in setting of subtherap.INR.  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  PHYSICAL EXAM BP 108/62  Pulse 82  Ht 5\' 8"  (1.727  m)  Wt 102 lb (46.267 kg)  BMI 15.51 kg/m2 PHYSICAL EXAM GEN:  No distress HEENT: Continued resolving extensive ecchymosis. NECK:  No jugular venous distention at 90 degrees, waveform within normal limits, carotid upstroke brisk and symmetric, no bruits, no thyromegaly LYMPHATICS:  No cervical adenopathy LUNGS:  Clear to auscultation bilaterally BACK:  No CVA tenderness CHEST:  Unremarkable HEART:  S1 and S2 within normal limits, no S3, no S4, no clicks, no rubs, no murmurs ABD:  Positive bowel sounds normal in frequency in pitch, no bruits, no rebound, no guarding, unable to assess midline mass or bruit with the patient seated. EXT:  2 plus pulses throughout, moderate edema, no cyanosis no clubbing SKIN:  No rashes no nodules NEURO: Facial droop with left weakness or gait   EKG:  Atrial fibrillation, rate 100, axis within normal limits, poor and her R wave  progression, no acute ST-T wave changes.  ASSESSMENT AND PLAN  Atrial fibrillation:  For now given her hemorrhagic stroke I am going to continue to avoid systemic anticoagulation. She had reasonable rate control in the hospital. Her pressures seem to be tolerating the medications as listed. I have reviewed her medications and I will make no change.  Syncope with CVA (cerebral infarction)  The etiology of her fall versus syncope is not clear.  There were no dysrhythmias noted on her extended stay in the hospital. She's had no further events. No change in therapy is indicated.

## 2012-12-10 ENCOUNTER — Non-Acute Institutional Stay (SKILLED_NURSING_FACILITY): Payer: Medicare Other | Admitting: Nurse Practitioner

## 2012-12-10 ENCOUNTER — Encounter: Payer: Self-pay | Admitting: Nurse Practitioner

## 2012-12-10 DIAGNOSIS — S72009S Fracture of unspecified part of neck of unspecified femur, sequela: Secondary | ICD-10-CM

## 2012-12-10 DIAGNOSIS — I639 Cerebral infarction, unspecified: Secondary | ICD-10-CM

## 2012-12-10 DIAGNOSIS — I6350 Cerebral infarction due to unspecified occlusion or stenosis of unspecified cerebral artery: Secondary | ICD-10-CM

## 2012-12-10 DIAGNOSIS — D649 Anemia, unspecified: Secondary | ICD-10-CM

## 2012-12-10 DIAGNOSIS — S72002S Fracture of unspecified part of neck of left femur, sequela: Secondary | ICD-10-CM

## 2012-12-10 DIAGNOSIS — Z8673 Personal history of transient ischemic attack (TIA), and cerebral infarction without residual deficits: Secondary | ICD-10-CM

## 2012-12-10 DIAGNOSIS — R55 Syncope and collapse: Secondary | ICD-10-CM

## 2012-12-10 DIAGNOSIS — E039 Hypothyroidism, unspecified: Secondary | ICD-10-CM

## 2012-12-10 DIAGNOSIS — I10 Essential (primary) hypertension: Secondary | ICD-10-CM

## 2012-12-10 DIAGNOSIS — I4891 Unspecified atrial fibrillation: Secondary | ICD-10-CM

## 2012-12-10 DIAGNOSIS — R569 Unspecified convulsions: Secondary | ICD-10-CM

## 2012-12-10 NOTE — Progress Notes (Signed)
Subjective:    Patient ID: [name], female    DOB: [birth year], 79 y.o.   MRN: [id number]  HPI  79 year old female was admitted 09/20/2012 [facility] after standing at the sink and turning quickly then subsequently falling to the floor with possible syncope. She sustained left hip fracture for which she had surgery on 09/21/12 after reversal of her coumadin for A-Fib. On 09/24/12 she was at a bedside commode (constipated) and had an episode of unresponsiveness, respirations slowed, heart rate increased. Code started, but patient returned to baseline once back to bed. MRI showed acute/subacute hemorrhage within the choroid of the left lateral ventricle with a small amount of intraventricular blood layering dependently-also a small amount of subdural blood along both convexities L>R, but only a few millimeters in thickness. She was taken off of Coumadin at that point.  Patient was discharged 10/01/2012 to [facility] for SNF rehab. While she has been at [facility] she was having increased confusion. She was taken off remeron and confusion somewhat improved however she was then found to have UTI. Pt still has confusion at times.  Pt and husband would like her to discharge back to [facility] with 24 hour care. Home health therapy and DME will be needed.   Syncope at home-- will need out-patient event monitor to be arranged at time of d/c per Cardiology (Dr. Jens Som) .  Hip fracture s/p L Bipolar arthroplasty  no pharmacologic anticoagulation secondary to bleed.  Left intraventricular hemorrhage/subdural hematoma 09/24/2012 (not present on CT head at admit 09/20/2012) Per Neuro "based on imaging hemorrhage likely spontaneous or possibly late and related to previous injury" holding further anticoagulation until cleared for same by Neuro as outpatient.  Chronic Atrial fibrillation/flutter w/ RVR Rate controlled on Cardizem-- pt has not been on digoxin since prior to hospital discharge. Pt not on anticoagulation at  this time due to bleed Osteopenia  Takes calcium Seizure disorder  Cont Keppra  Hypothyroidism  cont synthroid  History of CVA (cerebrovascular accident)  R MCA CVA >> received tPA (05/2011) - left her with left sided weakness  Medications:   acetaminophen 325 MG tablet  Commonly known as:  TYLENOL  Take 650 mg by mouth every 6 (six) hours as needed for pain.  albuterol (5 MG/ML) 0.5% nebulizer solution  Commonly known as:  PROVENTIL  Take 0.5 mLs (2.5 mg total) by nebulization every 2 (two) hours as needed for wheezing.  ALPRAZolam 0.5 MG tablet  Commonly known as:  XANAX  Take 0.5 mg by mouth 2 (two) times daily.  atorvastatin 10 MG tablet  Commonly known as:  LIPITOR  Take 10 mg by mouth daily at 6 PM.  diltiazem 120 MG 24 hr capsule  Commonly known as:  CARDIZEM CD  Take 1 capsule (120 mg total) by mouth daily.  feeding supplement Liqd  Take 237 mLs by mouth 2 (two) times daily between meals.  ferrous sulfate 325 (65 FE) MG tablet  Take 325 mg by mouth 2 (two) times daily with a meal.  HYDROcodone-acetaminophen 5-325 MG per tablet  Commonly known as:  NORCO/VICODIN  Take 1 tablet by mouth every 6 (six) hours as needed for pain.  levETIRAcetam 500 MG tablet  Commonly known as:  KEPPRA  Take 500 mg by mouth 2 (two) times daily.  levothyroxine 50 MCG tablet  Commonly known as:  SYNTHROID, LEVOTHROID  Take 50 mcg by mouth every morning.  multivitamin with minerals Tabs  Take 1 tablet by mouth daily  at 12 noon.  polyethylene glycol packet  Commonly known as:  MIRALAX / GLYCOLAX  Take 17 g by mouth daily.  RESOURCE THICKENUP CLEAR Powd  As needed.  sennosides-docusate sodium 8.6-50 MG tablet  Commonly known as:  SENOKOT-S  Take 1 tablet by mouth at bedtime and may repeat dose one time if needed.   STOOL SOFTENER PO  Take 1 capsule by mouth at bedtime. Review of Systems  Constitutional: Negative for fever, chills, activity  change and appetite change (reports ongoing poor appetite).  HENT: Negative.   Eyes: Negative.   Respiratory: Negative.   Cardiovascular: Negative.   Gastrointestinal: Positive for constipation (with PO medications that help symptoms ).  Musculoskeletal: Positive for myalgias and gait problem (left sided weakness ).  Neurological: Positive for weakness (generalized but worse on the left side ). Negative for dizziness and headaches. Seizures: history of seizures- none currently. Syncope: none recently.  Hematological: Negative.   Psychiatric/Behavioral: Positive for agitation (easily agitated ).       Objective:   Physical Exam  Constitutional: No distress.  HENT:  Head: Normocephalic and atraumatic.  Right Ear: External ear normal.  Left Ear: External ear normal.  Eyes: Conjunctivae are normal. Pupils are equal, round, and reactive to light.  Neck: Normal range of motion. Neck supple.  Cardiovascular: Normal rate and normal heart sounds.   Pulmonary/Chest: Effort normal and breath sounds normal.  Abdominal: Soft. Bowel sounds are normal.  Musculoskeletal: She exhibits no edema and no tenderness.  Neurological: She is alert.  Generalized weakness- currently in the wheelchair- left side weaker than right  Skin: Skin is warm and dry. She is not diaphoretic.  Psychiatric:  Oriented to person only   11/20/12: urine culture >=100,000 colonies  11/22/12: wbc 11.9, rbc 4.26, hgb 12.9, hct 38.4 11/26/12: sodium 144, potassium 3.7, glucose 96, BUN 17, Cr 0.51     Assessment & Plan:  Atrial fibrillation- pt previously on dig however this medication was not on the list when sent from hospital. Currently rate controlled on Cardizem  History of CVA (cerebrovascular accident) with left sided weakness- unchanged  Syncope/Fall with hip fracture- s/p L Bipolar arthroplasty with generalized weakness will need home health therapies once she is back to [facility]  Hypothyroidism- stable  HTN  (hypertension) stable  Seizure- no recent seizures-cont on keppra  Anemia- post op- will need to cont to be monitored  IVH (intraventricular hemorrhage) - cont to hold coumadin  This resident needs a hospital bed because their medical conditions requires positioning of the bed in ways not feasible with ordinary bed. pillows and wedges have been considered and will not allow the correct positioning for the patient. This resident requires frequent changes in their positioning due to physical condition and a hospital bed will alleviate this problem. Will order hospital bed at this time. Resident needs wheelchair due to limitation in mobility which prevents adequate ambulation for ADLs in the home. This ambulation can not be sufficiently resolved by the use of a can or walker. The use of a wheelchair will significantly improve patients quality of life and ability to participate in ADLs therefore will order wheelchair at this time.   pt is stable for discharge to [facility] with 24 hours care.-will need PT/OT/Nursing/HHA/SW per home health. DME needed includes wheelchair, hospital bed, 3:1. Rx written.  will need to follow up with PCP within 2 weeks.

## 2012-12-24 ENCOUNTER — Other Ambulatory Visit: Payer: Self-pay | Admitting: *Deleted

## 2012-12-31 ENCOUNTER — Ambulatory Visit (HOSPITAL_COMMUNITY)
Admission: RE | Admit: 2012-12-31 | Discharge: 2012-12-31 | Disposition: A | Discharge status: Home / Self Care | Payer: Medicare Other | Source: Ambulatory Visit | Attending: Internal Medicine | Admitting: Internal Medicine

## 2012-12-31 DIAGNOSIS — R1310 Dysphagia, unspecified: Secondary | ICD-10-CM

## 2012-12-31 DIAGNOSIS — R1313 Dysphagia, pharyngeal phase: Secondary | ICD-10-CM | POA: Insufficient documentation

## 2012-12-31 NOTE — Procedures (Signed)
Objective Swallowing Evaluation: Modified Barium Swallowing Study  Patient Details  Name: [name] MRN: [id number] Date of Birth: 05-30-1931  Today's Date: 12/31/2012 Time: 1110-1140 SLP Time Calculation (min): 30 min  Past Medical History:  Past Medical History  Diagnosis Date  . Dysphagia   . Atrial fibrillation     a. Dx 05/2011 at time of CVA.  Marland Kitchen Hyperlipidemia   . HTN (hypertension)   . Hypothyroidism   . Pneumonia     "alot of pneumonia" (09/21/2012)  . Syncope and collapse     "fell @ [place]; hit left forehead"; left hip fx/notes 09/20/2012  . Vertigo   . Arthritis     "some; all over" (09/22/2011)  . Anxiety   . Depression   . Seizures      a. 05/2011 right MCA CVA in setting of new onset afib s/p thrombolysis/thrombolytics. b. Recurrent CVA 10/2011 in setting of subtherap.INR.  "only one she ever had was 3 months after her stroke" (09/21/2012  . Seizure 10/2011  . Stroke 05/2011    a. 05/2011 right MCA CVA in setting of new onset afib s/p thrombolysis/thrombolytics. b. Recurrent CVA 10/2011 in setting of subtherap.INR.   Past Surgical History:  Past Surgical History  Procedure Laterality Date  . Lumbar disc surgery  1981  . Vaginal hysterectomy  1982  . Pleural scarification  1977?; 1986    "2 lung surgeries; my lungs had collapsed" (09/21/2012)  . Tonsillectomy and adenoidectomy  1950?  Marland Kitchen Appendectomy  1982  . Thrombolysis  05/2011    [name] 09/20/2012  . Hip arthroplasty  09/21/2012    Procedure: ARTHROPLASTY BIPOLAR HIP;  Surgeon: Kerrin Champagne, MD;  Location: [facility] OR;  Service: Orthopedics;  Laterality: Left;   HPI:  Pt is a 79 year old female arriving for an outpatient MBS for a possible diet upgrade from nectar thick liquids. Pt is from [facility]. Pt known to this SLP from previous admission in 1/14 for Hip hemiarthroplasty following a fall. At that time pt was severely deconditioned. Recommended to consume Dys 2/nectar due to a baseline structural dysphagia that  she could no longer compensate for. PMH: large old right CVA, acute right frontal/temporal infarct 2/13, dysphagia with 3 MBS' between 05/24/11-06/08/11. MBS 06/08/11 revealed flash penetration with thin liquids with recommendation of DYS 2 thin liquids, double swallows and intermittent throat clear.      Assessment / Plan / Recommendation Clinical Impression  Dysphagia Diagnosis: Moderate cervical esophageal phase dysphagia;Moderate pharyngeal phase dysphagia Clinical impression: Pt presents with what is likely a return to baseline function following acute hospital stay earlier this year. She demosntrates a primary structural, cervical esophageal dysphagia impacting pharyngeal function. There is limited opneing of UES for transit of bolus, probably due to appearance of curvature in cervical spine (no radiologist present to confirm). With reduced opening ot UES there is limited ability of pharyngeal strength and mobility to transit bolus with moderate residuals remaining post swallow requiring 3-6 swallow to transit each bolus (pt is not aware of multiple swallows though she does them reflexively). An attempted chin tuck lead to silent penetration to vocal cords. The best strategy was increasing bolus size/weight which increased opening of UES and bolus transit. Pt is recommended to consume a regular diet and thin liquids with moderate to large sips/bites. She should alternate between solids and liquids. The pt also reports she has not been working with an SLP since d/c from hospital. She would benefit from SLP therapy for pharyngeal  strengthening exercises to improve function. Eduated pt and husband to be aware that swallow function also likely to worsen with significant illness/debilitation. Aspiration risk at baseline is mild to moderate.     Treatment Recommendation  Defer treatment plan to SLP at (Comment) (SNF)    Diet Recommendation Regular;Thin liquid   Liquid Administration via:  Cup;Straw Medication Administration: Whole meds with liquid Supervision: Patient able to self feed Compensations: Slow rate;Multiple dry swallows after each bite/sip;Follow solids with liquid Postural Changes and/or Swallow Maneuvers: Seated upright 90 degrees    Other  Recommendations     Follow Up Recommendations  Skilled Nursing facility    Frequency and Duration        Pertinent Vitals/Pain NA    SLP Swallow Goals     General HPI: Pt is a 79 year old female arriving for an outpatient MBS for a possible diet upgrade from nectar thick liquids. Pt is from [facility]. Pt known to this SLP from previous admission in 1/14 for Hip hemiarthroplasty following a fall. At that time pt was severely deconditioned. Recommended to consume Dys 2/nectar due to a baseline structural dysphagia that she could no longer compensate for. PMH: large old right CVA, acute right frontal/temporal infarct 2/13, dysphagia with 3 MBS' between 05/24/11-06/08/11. MBS 06/08/11 revealed flash penetration with thin liquids with recommendation of DYS 2 thin liquids, double swallows and intermittent throat clear.  Type of Study: Modified Barium Swallowing Study Previous Swallow Assessment: 09/25/12 - Dys2, nectar Diet Prior to this Study: Dysphagia 2 (chopped);Nectar-thick liquids Temperature Spikes Noted: N/A Respiratory Status: Room air History of Recent Intubation: No Behavior/Cognition: Alert;Cooperative Oral Cavity - Dentition: Adequate natural dentition Oral Motor / Sensory Function: Impaired motor Oral impairment: Left lingual;Left facial;Left labial Patient Positioning: Upright in chair Baseline Vocal Quality: Clear Volitional Cough: Strong Volitional Swallow: Able to elicit Anatomy: Other (Comment) Pharyngeal Secretions:  (Appearance of dramatic curvature of spine at level of UES)    Reason for Referral     Oral Phase Oral Preparation/Oral Phase Oral Phase: WFL   Pharyngeal Phase Pharyngeal  Phase Pharyngeal Phase: Impaired Pharyngeal - Thin Pharyngeal - Thin Teaspoon: Reduced pharyngeal peristalsis;Reduced epiglottic inversion;Reduced anterior laryngeal mobility;Reduced laryngeal elevation;Reduced tongue base retraction;Pharyngeal residue - valleculae;Pharyngeal residue - pyriform sinuses Pharyngeal - Thin Cup: Reduced pharyngeal peristalsis;Reduced epiglottic inversion;Reduced anterior laryngeal mobility;Reduced laryngeal elevation;Reduced tongue base retraction;Pharyngeal residue - valleculae;Pharyngeal residue - pyriform sinuses;Penetration/Aspiration during swallow (penetration with a chin tuck) Penetration/Aspiration details (thin cup): Material enters airway, remains ABOVE vocal cords and not ejected out;Material does not enter airway Pharyngeal - Thin Straw: Reduced pharyngeal peristalsis;Reduced epiglottic inversion;Reduced anterior laryngeal mobility;Reduced laryngeal elevation;Reduced tongue base retraction;Pharyngeal residue - valleculae;Pharyngeal residue - pyriform sinuses Pharyngeal - Solids Pharyngeal - Puree: Reduced pharyngeal peristalsis;Reduced epiglottic inversion;Reduced anterior laryngeal mobility;Reduced laryngeal elevation;Reduced tongue base retraction;Pharyngeal residue - valleculae;Pharyngeal residue - pyriform sinuses Pharyngeal - Regular: Reduced pharyngeal peristalsis;Reduced epiglottic inversion;Reduced anterior laryngeal mobility;Reduced laryngeal elevation;Reduced tongue base retraction;Pharyngeal residue - valleculae;Pharyngeal residue - pyriform sinuses Pharyngeal - Pill: Reduced pharyngeal peristalsis;Reduced epiglottic inversion;Reduced anterior laryngeal mobility;Reduced laryngeal elevation;Reduced tongue base retraction;Pharyngeal residue - valleculae;Pharyngeal residue - pyriform sinuses  Cervical Esophageal Phase    GO    Cervical Esophageal Phase Cervical Esophageal Phase: Impaired Cervical Esophageal Phase - Comment Cervical Esophageal  Comment: limited opening of UES with appearance that curvature of spine adding additional pressure to UES.     Functional Assessment Tool Used: clinical judgement Functional Limitations: Swallowing Swallow Current Status (Z6109): At least 20 percent but less than 40  percent impaired, limited or restricted Swallow Goal Status [phone]): At least 20 percent but less than 40 percent impaired, limited or restricted Swallow Discharge Status [phone]): At least 20 percent but less than 40 percent impaired, limited or restricted    Joanann Mies, Riley Nearing 12/31/2012, 1:00 PM

## 2013-01-01 ENCOUNTER — Encounter (HOSPITAL_COMMUNITY): Payer: Medicare Other

## 2013-01-01 ENCOUNTER — Other Ambulatory Visit (HOSPITAL_COMMUNITY): Payer: Self-pay | Admitting: Endocrinology

## 2013-01-01 DIAGNOSIS — M25562 Pain in left knee: Secondary | ICD-10-CM

## 2013-01-02 ENCOUNTER — Observation Stay (HOSPITAL_COMMUNITY): Payer: Medicare Other

## 2013-01-02 ENCOUNTER — Encounter (HOSPITAL_COMMUNITY): Payer: Self-pay | Admitting: *Deleted

## 2013-01-02 ENCOUNTER — Observation Stay (HOSPITAL_COMMUNITY)
Admission: EM | Admit: 2013-01-02 | Discharge: 2013-01-03 | Disposition: A | Discharge status: Home / Self Care | Payer: Medicare Other | Attending: Family Medicine | Admitting: Family Medicine

## 2013-01-02 ENCOUNTER — Ambulatory Visit (HOSPITAL_COMMUNITY)
Admission: RE | Admit: 2013-01-02 | Discharge: 2013-01-02 | Disposition: A | Discharge status: Home / Self Care | Payer: Medicare Other | Source: Ambulatory Visit | Attending: Endocrinology | Admitting: Endocrinology

## 2013-01-02 DIAGNOSIS — I615 Nontraumatic intracerebral hemorrhage, intraventricular: Secondary | ICD-10-CM | POA: Diagnosis present

## 2013-01-02 DIAGNOSIS — I6350 Cerebral infarction due to unspecified occlusion or stenosis of unspecified cerebral artery: Secondary | ICD-10-CM

## 2013-01-02 DIAGNOSIS — I82409 Acute embolism and thrombosis of unspecified deep veins of unspecified lower extremity: Secondary | ICD-10-CM

## 2013-01-02 DIAGNOSIS — G319 Degenerative disease of nervous system, unspecified: Secondary | ICD-10-CM | POA: Insufficient documentation

## 2013-01-02 DIAGNOSIS — I4891 Unspecified atrial fibrillation: Secondary | ICD-10-CM | POA: Diagnosis present

## 2013-01-02 DIAGNOSIS — E785 Hyperlipidemia, unspecified: Secondary | ICD-10-CM | POA: Insufficient documentation

## 2013-01-02 DIAGNOSIS — E039 Hypothyroidism, unspecified: Secondary | ICD-10-CM | POA: Diagnosis present

## 2013-01-02 DIAGNOSIS — R569 Unspecified convulsions: Secondary | ICD-10-CM | POA: Diagnosis present

## 2013-01-02 DIAGNOSIS — M25562 Pain in left knee: Secondary | ICD-10-CM

## 2013-01-02 DIAGNOSIS — Z8673 Personal history of transient ischemic attack (TIA), and cerebral infarction without residual deficits: Secondary | ICD-10-CM

## 2013-01-02 DIAGNOSIS — I82402 Acute embolism and thrombosis of unspecified deep veins of left lower extremity: Secondary | ICD-10-CM

## 2013-01-02 DIAGNOSIS — I10 Essential (primary) hypertension: Secondary | ICD-10-CM | POA: Diagnosis present

## 2013-01-02 DIAGNOSIS — I639 Cerebral infarction, unspecified: Secondary | ICD-10-CM

## 2013-01-02 DIAGNOSIS — M79609 Pain in unspecified limb: Secondary | ICD-10-CM

## 2013-01-02 DIAGNOSIS — M7989 Other specified soft tissue disorders: Secondary | ICD-10-CM

## 2013-01-02 DIAGNOSIS — I824Y9 Acute embolism and thrombosis of unspecified deep veins of unspecified proximal lower extremity: Principal | ICD-10-CM | POA: Insufficient documentation

## 2013-01-02 HISTORY — DX: Acute embolism and thrombosis of unspecified deep veins of unspecified lower extremity: I82.409

## 2013-01-02 LAB — URINALYSIS, ROUTINE W REFLEX MICROSCOPIC
Bilirubin Urine: NEGATIVE
Glucose, UA: NEGATIVE mg/dL
Hgb urine dipstick: NEGATIVE
Specific Gravity, Urine: 1.019 (ref 1.005–1.030)
pH: 6 (ref 5.0–8.0)

## 2013-01-02 LAB — BASIC METABOLIC PANEL
CO2: 29 mEq/L (ref 19–32)
Calcium: 10.1 mg/dL (ref 8.4–10.5)
Creatinine, Ser: 0.73 mg/dL (ref 0.50–1.10)
GFR calc Af Amer: >90 mL/min (ref >90)
GFR calc non Af Amer: 79 mL/min — ABNORMAL LOW (ref >90)

## 2013-01-02 LAB — CBC WITH DIFFERENTIAL/PLATELET
Basophils Absolute: 0 10*3/uL (ref 0.0–0.1)
Eosinophils Relative: 2 % (ref 0–5)
Lymphocytes Relative: 19 % (ref 12–46)
MCV: 90.1 fL (ref 78.0–100.0)
Platelets: 229 10*3/uL (ref 150–400)
RDW: 14.8 % (ref 11.5–15.5)
WBC: 6.6 10*3/uL (ref 4.0–10.5)

## 2013-01-02 LAB — APTT: aPTT: 30 seconds (ref 24–37)

## 2013-01-02 MED ORDER — ACETAMINOPHEN 325 MG PO TABS: 650.0000 mg | ORAL_TABLET | Freq: Four times a day (QID) | ORAL | Status: DC | PRN

## 2013-01-02 MED ORDER — SODIUM CHLORIDE 0.9 % IJ SOLN: 3.0000 mL | INTRAMUSCULAR | Status: DC | PRN

## 2013-01-02 MED ORDER — ALPRAZOLAM 0.5 MG PO TABS: 0.5000 mg | ORAL_TABLET | Freq: Every evening | ORAL | Status: DC | PRN

## 2013-01-02 MED ORDER — ACETAMINOPHEN 650 MG RE SUPP: 650.0000 mg | Freq: Four times a day (QID) | RECTAL | Status: DC | PRN

## 2013-01-02 MED ORDER — HYDROCODONE-ACETAMINOPHEN 5-325 MG PO TABS: 1.0000 | ORAL_TABLET | Freq: Four times a day (QID) | ORAL | Status: DC | PRN

## 2013-01-02 MED ORDER — SODIUM CHLORIDE 0.9 % IV SOLN: 250.0000 mL | INTRAVENOUS | Status: DC | PRN

## 2013-01-02 MED ORDER — DILTIAZEM HCL ER COATED BEADS 120 MG PO CP24
120.0000 mg | ORAL_CAPSULE | Freq: Every day | ORAL | Status: DC
  Administered 2013-01-03: 120 mg via ORAL
  Filled 2013-01-02: qty 1

## 2013-01-02 MED ORDER — ONDANSETRON HCL 4 MG PO TABS: 4.0000 mg | ORAL_TABLET | Freq: Four times a day (QID) | ORAL | Status: DC | PRN

## 2013-01-02 MED ORDER — DOCUSATE SODIUM 100 MG PO CAPS: 100.0000 mg | ORAL_CAPSULE | Freq: Two times a day (BID) | ORAL | Status: DC | PRN

## 2013-01-02 MED ORDER — POLYETHYLENE GLYCOL 3350 17 G PO PACK: 17.0000 g | PACK | Freq: Every day | ORAL | Status: DC | PRN

## 2013-01-02 MED ORDER — BARRIER CREAM NON-SPECIFIED
1.0000 "application " | TOPICAL_CREAM | Freq: Two times a day (BID) | TOPICAL | Status: DC | PRN
  Filled 2013-01-02: qty 1

## 2013-01-02 MED ORDER — SODIUM CHLORIDE 0.9 % IJ SOLN
3.0000 mL | Freq: Two times a day (BID) | INTRAMUSCULAR | Status: DC
  Administered 2013-01-02 – 2013-01-03 (×2): 3 mL via INTRAVENOUS

## 2013-01-02 MED ORDER — ONDANSETRON HCL 4 MG/2ML IJ SOLN: 4.0000 mg | Freq: Four times a day (QID) | INTRAMUSCULAR | Status: DC | PRN

## 2013-01-02 MED ORDER — ENSURE COMPLETE PO LIQD
237.0000 mL | Freq: Two times a day (BID) | ORAL | Status: DC
  Administered 2013-01-03: 237 mL via ORAL

## 2013-01-02 MED ORDER — ATORVASTATIN CALCIUM 10 MG PO TABS
10.0000 mg | ORAL_TABLET | Freq: Every day | ORAL | Status: DC
  Filled 2013-01-02: qty 1

## 2013-01-02 MED ORDER — LEVETIRACETAM 500 MG PO TABS
500.0000 mg | ORAL_TABLET | Freq: Two times a day (BID) | ORAL | Status: DC
  Administered 2013-01-02 – 2013-01-03 (×2): 500 mg via ORAL
  Filled 2013-01-02 (×4): qty 1

## 2013-01-02 MED ORDER — ADULT MULTIVITAMIN W/MINERALS CH
1.0000 | ORAL_TABLET | Freq: Every day | ORAL | Status: DC
  Administered 2013-01-03: 1 via ORAL
  Filled 2013-01-02 (×2): qty 1

## 2013-01-02 MED ORDER — LEVOTHYROXINE SODIUM 50 MCG PO TABS
50.0000 ug | ORAL_TABLET | Freq: Every day | ORAL | Status: DC
  Administered 2013-01-03: 50 ug via ORAL
  Filled 2013-01-02 (×2): qty 1

## 2013-01-02 NOTE — Progress Notes (Signed)
*  PRELIMINARY RESULTS* Vascular Ultrasound Left lower extremity venous duplex has been completed.  Preliminary findings: Left = DVT involving the left common femoral vein.  Patient instructed to go to [facility] at 4:20pm.  Farrel Demark, RDMS, RVT  01/02/2013, 12:49 PM

## 2013-01-02 NOTE — H&P (Signed)
[facility] History and Physical  KHADIJATOU BORAK QIO:[id number] DOB: Mar 19, 1931 DOA: 01/02/2013  Referring physician: er PCP: [name], MD  Specialists: neurology- Dr. Amada Jupiter  Chief Complaint: DVT diagnosed as outpatient  HPI: [name] is a 79 y.o. female  Who was d/c'd in Jan 2014 with a hemm CVA.  She never followed up with neurology after her d/c.  She has had a week of worsening pain and some swelling in her left leg- behind her thigh.  Today she had an U/S of her left leg done as an outpatient.  She was found to have a DVT- she went to her PCP for follow up and they sent her to the ER as she was too complicated to treat as an outpatient.    She was d/c'd from a SNF about a month ago.  Yesterday, she was started on cipro for a UTI.  She is currently pain free- no CP, no SOB, no fever, no chills.  She has been recovering nicely from her CVA up to this point- ambulating- graduated to a regular diet.    Hospitalist were called for the admission and treatment of her DVT.  Anticoagulation was held initially in the ER while we await recommendations by Neurology    Review of Systems: all systems reviewed, negative unless stated above   Past Medical History  Diagnosis Date  . Dysphagia   . Atrial fibrillation     a. Dx 05/2011 at time of CVA.  Marland Kitchen Hyperlipidemia   . HTN (hypertension)   . Hypothyroidism   . Pneumonia     "alot of pneumonia" (09/21/2012)  . Syncope and collapse     "fell @ [place]; hit left forehead"; left hip fx/notes 09/20/2012  . Vertigo   . Arthritis     "some; all over" (09/22/2011)  . Anxiety   . Depression   . Seizures      a. 05/2011 right MCA CVA in setting of new onset afib s/p thrombolysis/thrombolytics. b. Recurrent CVA 10/2011 in setting of subtherap.INR.  "only one she ever had was 3 months after her stroke" (09/21/2012  . Seizure 10/2011  . Stroke 05/2011    a. 05/2011 right MCA CVA in setting of new onset afib s/p  thrombolysis/thrombolytics. b. Recurrent CVA 10/2011 in setting of subtherap.INR.   Past Surgical History  Procedure Laterality Date  . Lumbar disc surgery  1981  . Vaginal hysterectomy  1982  . Pleural scarification  1977?; 1986    "2 lung surgeries; my lungs had collapsed" (09/21/2012)  . Tonsillectomy and adenoidectomy  1950?  Marland Kitchen Appendectomy  1982  . Thrombolysis  05/2011    [name] 09/20/2012  . Hip arthroplasty  09/21/2012    Procedure: ARTHROPLASTY BIPOLAR HIP;  Surgeon: Kerrin Champagne, MD;  Location: [facility] OR;  Service: Orthopedics;  Laterality: Left;   Social History:  reports that she quit smoking about 33 years ago. Her smoking use included Cigarettes. She has a 15 pack-year smoking history. She has never used smokeless tobacco. She reports that she drinks about 0.6 ounces of alcohol per week. She reports that she does not use illicit drugs.   No Known Allergies  Family History  Problem Relation Age of Onset  . CAD Neg Hx     Prior to Admission medications   Medication Sig Start Date End Date Taking? Authorizing Provider  acetaminophen (TYLENOL) 325 MG tablet Take 650 mg by mouth every 6 (six) hours as needed for pain.   Yes  Historical Provider, MD  ALPRAZolam Prudy Feeler) 0.5 MG tablet Take 0.5 mg by mouth at bedtime as needed for sleep or anxiety.   Yes Historical Provider, MD  atorvastatin (LIPITOR) 10 MG tablet Take 10 mg by mouth daily at 6 PM. 11/17/11  Yes Nishant Dhungel, MD  barrier cream (NON-SPECIFIED) CREA Apply 1 application topically 2 (two) times daily as needed (to bed sore).   Yes Historical Provider, MD  ciprofloxacin (CIPRO) 500 MG tablet Take 500 mg by mouth 2 (two) times daily.   Yes Historical Provider, MD  diltiazem (CARDIZEM CD) 120 MG 24 hr capsule Take 1 capsule (120 mg total) by mouth daily. 10/01/12  Yes Kathlen Mody, MD  docusate sodium (COLACE) 100 MG capsule Take 100 mg by mouth 2 (two) times daily as needed for constipation.   Yes Historical Provider, MD   feeding supplement (ENSURE COMPLETE) LIQD Take 237 mLs by mouth 2 (two) times daily between meals. 10/01/12  Yes Kathlen Mody, MD  HYDROcodone-acetaminophen (NORCO/VICODIN) 5-325 MG per tablet Take 1 tablet by mouth every 6 (six) hours as needed for pain.   Yes Historical Provider, MD  levETIRAcetam (KEPPRA) 500 MG tablet Take 500 mg by mouth 2 (two) times daily. 11/17/11 11/16/13 Yes Nishant Dhungel, MD  levothyroxine (SYNTHROID, LEVOTHROID) 50 MCG tablet Take 50 mcg by mouth every morning.    Yes Historical Provider, MD  Multiple Vitamin (MULITIVITAMIN WITH MINERALS) TABS Take 1 tablet by mouth daily at 12 noon.   Yes Historical Provider, MD  polyethylene glycol (MIRALAX / GLYCOLAX) packet Take 17 g by mouth daily as needed (constipation).    Yes Historical Provider, MD   Physical Exam: Filed Vitals:   01/02/13 1432 01/02/13 1730  BP: 109/63 113/57  Pulse: 95 90  Temp: 97.5 F (36.4 C)   TempSrc: Oral   Resp: 16   SpO2: 98% 98%     General:  Frail, elderly femal  Eyes: wnl  ENT: wnl  Neck: supple  Cardiovascular: rrr  Respiratory: clear  Abdomen: +BS, soft, NT  Skin: multiple areas of briusing  Musculoskeletal: left sided weakness- from old CVA  Psychiatric: flat  Neurologic: A+Ox3, left sided weakness  Labs on Admission:  Basic Metabolic Panel:  Recent Labs Lab 01/02/13 1612  NA 136  K 3.7  CL 99  CO2 29  GLUCOSE 95  BUN 14  CREATININE 0.73  CALCIUM 10.1   Liver Function Tests: No results found for this basename: AST, ALT, ALKPHOS, BILITOT, PROT, ALBUMIN,  in the last 168 hours No results found for this basename: LIPASE, AMYLASE,  in the last 168 hours No results found for this basename: AMMONIA,  in the last 168 hours CBC:  Recent Labs Lab 01/02/13 1612  WBC 6.6  NEUTROABS 4.4  HGB 13.5  HCT 39.0  MCV 90.1  PLT 229   Cardiac Enzymes: No results found for this basename: CKTOTAL, CKMB, CKMBINDEX, TROPONINI,  in the last 168 hours  BNP (last  3 results) No results found for this basename: PROBNP,  in the last 8760 hours CBG: No results found for this basename: GLUCAP,  in the last 168 hours  Radiological Exams on Admission: No results found.    Assessment/Plan Principal Problem:   DVT (deep venous thrombosis) Active Problems:   Atrial fibrillation   History of CVA (cerebrovascular accident)   Hypothyroidism   HTN (hypertension)   Seizure   IVH (intraventricular hemorrhage)   1. DVT- hold on anticoagulation until seen by neurology- if unable to  be anticoagulated, will most likelu need IVC filter 2. A fib- has cardiology as outpatient- appears to be in sinus 3. H/o recent (1/14) hemm CVA- PT once DVT is addressed 4. Seizure h/o- keppra continued 5. HTN- continue home meds 6. Recent diagnosis of UTI- hold cipro for now as U/A clean  Neurology consulted by me to make recommendations about anticoagulation  Code Status: full Family Communication: caregiver at bedside Disposition Plan: home 1-2 days- med surg bed  Time spent: 70 min  Mahki Spikes [facility] Pager [phone]  If 7PM-7AM, please contact night-coverage [email or web address] Password [facility] 01/02/2013, 5:58 PM

## 2013-01-02 NOTE — Consult Note (Addendum)
Reason for Consult: DVT recommendations for treatment Referring Physician: Dr. Benjamine Mola  CC: left calf pain  HPI: [name] is an 79 y.o. female who fell in January and sustained left hip fracture. She was already on coumadin for her atrial fibrillation. Post op, she was bridged with IV heparin and began to have left sided weakness. Heparin was stopped and her INR was reversed. MR confirmed an acute hemorrhage of the left lateral ventricle. She was reversed and has been on no anticoagulations. She was then discharged to SNF and recently came home. She is doing home rehab. She confirms that she has had left sided weakness. She has a left facial droop which her husband thinks is not new, but her sitter says is new. Patient/husband deny having any further follow up head scans since discharge in January.  She started having left calf pain and swelling over the past 2 days. She had u/s as outpt and diagnosed with DVT. She was admitted for anticoagulation.    Past Medical History  Diagnosis Date  . Dysphagia   . Atrial fibrillation     a. Dx 05/2011 at time of CVA.  Marland Kitchen Hyperlipidemia   . HTN (hypertension)   . Hypothyroidism   . Pneumonia     "alot of pneumonia" (09/21/2012)  . Syncope and collapse     "fell @ [facility]; hit left forehead"; left hip fx/notes 09/20/2012  . Vertigo   . Arthritis     "some; all over" (09/22/2011)  . Anxiety   . Depression   . Seizures      a. 05/2011 right MCA CVA in setting of new onset afib s/p thrombolysis/thrombolytics. b. Recurrent CVA 10/2011 in setting of subtherap.INR.  "only one she ever had was 3 months after her stroke" (09/21/2012  . Seizure 10/2011  . Stroke 05/2011    a. 05/2011 right MCA CVA in setting of new onset afib s/p thrombolysis/thrombolytics. b. Recurrent CVA 10/2011 in setting of subtherap.INR.    Past Surgical History  Procedure Laterality Date  . Lumbar disc surgery  1981  . Vaginal hysterectomy  1982  . Pleural scarification  1977?; 1986     "2 lung surgeries; my lungs had collapsed" (09/21/2012)  . Tonsillectomy and adenoidectomy  1950?  Marland Kitchen Appendectomy  1982  . Thrombolysis  05/2011    [name] 09/20/2012  . Hip arthroplasty  09/21/2012    Procedure: ARTHROPLASTY BIPOLAR HIP;  Surgeon: Kerrin Champagne, MD;  Location: [facility] OR;  Service: Orthopedics;  Laterality: Left;    Family History  Problem Relation Age of Onset  . CAD Neg Hx     Social History:  reports that she quit smoking about 33 years ago. Her smoking use included Cigarettes. She has a 15 pack-year smoking history. She has never used smokeless tobacco. She reports that she drinks about 0.6 ounces of alcohol per week. She reports that she does not use illicit drugs.  No Known Allergies  No current facility-administered medications for this encounter.   Current Outpatient Prescriptions  Medication Sig Dispense Refill  . acetaminophen (TYLENOL) 325 MG tablet Take 650 mg by mouth every 6 (six) hours as needed for pain.      Marland Kitchen ALPRAZolam (XANAX) 0.5 MG tablet Take 0.5 mg by mouth at bedtime as needed for sleep or anxiety.      Marland Kitchen atorvastatin (LIPITOR) 10 MG tablet Take 10 mg by mouth daily at 6 PM.      . barrier cream (NON-SPECIFIED) CREA Apply 1  application topically 2 (two) times daily as needed (to bed sore).      . ciprofloxacin (CIPRO) 500 MG tablet Take 500 mg by mouth 2 (two) times daily.      Marland Kitchen diltiazem (CARDIZEM CD) 120 MG 24 hr capsule Take 1 capsule (120 mg total) by mouth daily.  30 capsule  1  . docusate sodium (COLACE) 100 MG capsule Take 100 mg by mouth 2 (two) times daily as needed for constipation.      . feeding supplement (ENSURE COMPLETE) LIQD Take 237 mLs by mouth 2 (two) times daily between meals.      Marland Kitchen HYDROcodone-acetaminophen (NORCO/VICODIN) 5-325 MG per tablet Take 1 tablet by mouth every 6 (six) hours as needed for pain.      Marland Kitchen levETIRAcetam (KEPPRA) 500 MG tablet Take 500 mg by mouth 2 (two) times daily.      Marland Kitchen levothyroxine (SYNTHROID,  LEVOTHROID) 50 MCG tablet Take 50 mcg by mouth every morning.       . Multiple Vitamin (MULITIVITAMIN WITH MINERALS) TABS Take 1 tablet by mouth daily at 12 noon.      . polyethylene glycol (MIRALAX / GLYCOLAX) packet Take 17 g by mouth daily as needed (constipation).         ROS: History obtained from spouse, the patient and sitter  General ROS: negative for - chills, fatigue, fever, night sweats, weight gain or weight loss Psychological ROS: negative for - behavioral disorder, hallucinations, memory difficulties, mood swings or suicidal ideation Ophthalmic ROS: negative for - blurry vision, double vision, eye pain or loss of vision ENT ROS: negative for - epistaxis, nasal discharge, oral lesions, sore throat, tinnitus or vertigo Allergy and Immunology ROS: negative for - hives or itchy/watery eyes Hematological and Lymphatic ROS: negative for - bleeding problems, bruising or swollen lymph nodes Endocrine ROS: negative for - galactorrhea, hair pattern changes, polydipsia/polyuria or temperature intolerance Respiratory ROS: negative for - cough, hemoptysis, shortness of breath or wheezing Cardiovascular ROS: negative for - chest pain, dyspnea on exertion, edema or irregular heartbeat Gastrointestinal ROS: negative for - abdominal pain, diarrhea, hematemesis, nausea/vomiting or stool incontinence Genito-Urinary ROS: negative for - dysuria, hematuria, incontinence or urinary frequency/urgency Musculoskeletal ROS: negative for - joint swelling, muscular weakness, +left calf pain/edema Neurological ROS: as noted in HPI Dermatological ROS: negative for rash and skin lesion changes  Physical Examination: Blood pressure 113/57, pulse 90, temperature 97.5 F (36.4 C), temperature source Oral, resp. rate 16, SpO2 98.00%.  Neurologic Examination Mental Status: Alert, oriented, thought content appropriate.  Speech fluent.  Able to follow 3 step commands without difficulty. Cranial Nerves: II:  visual fields grossly normal, pupils equal, round, reactive to light and accommodation. No papilledema.  III,IV, VI: ptosis not present, extra-ocular motions intact bilaterally V,VII: smile asymmetric, left facial droop, facial light touch sensation normal bilaterally VIII: hard of hearing IX,X: gag reflex present XI: trapezius strength/neck flexion strength normal bilaterally XII: tongue strength normal  Motor: Right : Upper extremity   5/5    Left:     Upper extremity   4/5  Lower extremity   5/5     Lower extremity   4-/5 Sensory: Pinprick and light touch  diminished on LLE Plantars: Right: downgoing   Left: downgoing Cerebellar: normal finger-to-nose. Gait not tested due to patients status of DVT.   Results for orders placed during the hospital encounter of 01/02/13 (from the past 48 hour(s))  BASIC METABOLIC PANEL     Status: Abnormal   Collection  Time    01/02/13  4:12 PM      Result Value Range   Sodium 136  135 - 145 mEq/L   Potassium 3.7  3.5 - 5.1 mEq/L   Chloride 99  96 - 112 mEq/L   CO2 29  19 - 32 mEq/L   Glucose, Bld 95  70 - 99 mg/dL   BUN 14  6 - 23 mg/dL   Creatinine, Ser 4.09  0.50 - 1.10 mg/dL   Calcium 81.1  8.4 - 91.4 mg/dL   GFR calc non Af Amer 79 (*) >90 mL/min   GFR calc Af Amer >90  >90 mL/min   Comment:            The eGFR has been calculated     using the CKD EPI equation.     This calculation has not been     validated in all clinical     situations.     eGFR's persistently     <90 mL/min signify     possible Chronic Kidney Disease.  CBC WITH DIFFERENTIAL     Status: None   Collection Time    01/02/13  4:12 PM      Result Value Range   WBC 6.6  4.0 - 10.5 K/uL   RBC 4.33  3.87 - 5.11 MIL/uL   Hemoglobin 13.5  12.0 - 15.0 g/dL   HCT 78.2  95.6 - 21.3 %   MCV 90.1  78.0 - 100.0 fL   MCH 31.2  26.0 - 34.0 pg   MCHC 34.6  30.0 - 36.0 g/dL   RDW 08.6  57.8 - 46.9 %   Platelets 229  150 - 400 K/uL   Neutrophils Relative 67  43 - 77 %    Neutro Abs 4.4  1.7 - 7.7 K/uL   Lymphocytes Relative 19  12 - 46 %   Lymphs Abs 1.3  0.7 - 4.0 K/uL   Monocytes Relative 12  3 - 12 %   Monocytes Absolute 0.8  0.1 - 1.0 K/uL   Eosinophils Relative 2  0 - 5 %   Eosinophils Absolute 0.1  0.0 - 0.7 K/uL   Basophils Relative 0  0 - 1 %   Basophils Absolute 0.0  0.0 - 0.1 K/uL  APTT     Status: None   Collection Time    01/02/13  4:12 PM      Result Value Range   aPTT 30  24 - 37 seconds  PROTIME-INR     Status: None   Collection Time    01/02/13  4:12 PM      Result Value Range   Prothrombin Time 12.9  11.6 - 15.2 seconds   INR 0.98  0.00 - 1.49  URINALYSIS, ROUTINE W REFLEX MICROSCOPIC     Status: None   Collection Time    01/02/13  4:57 PM      Result Value Range   Color, Urine YELLOW  YELLOW   APPearance CLEAR  CLEAR   Specific Gravity, Urine 1.019  1.005 - 1.030   pH 6.0  5.0 - 8.0   Glucose, UA NEGATIVE  NEGATIVE mg/dL   Hgb urine dipstick NEGATIVE  NEGATIVE   Bilirubin Urine NEGATIVE  NEGATIVE   Ketones, ur NEGATIVE  NEGATIVE mg/dL   Protein, ur NEGATIVE  NEGATIVE mg/dL   Urobilinogen, UA 0.2  0.0 - 1.0 mg/dL   Nitrite NEGATIVE  NEGATIVE   Leukocytes, UA NEGATIVE  NEGATIVE  Comment: MICROSCOPIC NOT DONE ON URINES WITH NEGATIVE PROTEIN, BLOOD, LEUKOCYTES, NITRITE, OR GLUCOSE <1000 mg/dL.     NO RADIOLOGICAL STUDIES ORDERED.    Job Founds, MBA, MHA [facility] Pager [phone]   01/02/2013, 6:43 PM   I have seen and evaluated the patient. I have reviewed the above note and made appropriate changes.   Assessment/Plan: 79yo female with new LLE DVT in setting of previous IVH Jan 2014. Her left sided weakness is an old deficit. She has had SDH and IVH on her previous imaging, and I do think it would be prudent to ensure stability of these prior to starting anticoagulation. If this imaging is stable, then I feel that the risk of anticoagulation would be low compared to the potentially fatal nature of  her current DVT and would consider anticoagulation.   1) MRI brain 2) If stable or resolved imaging findings, then I feel that anticoagulation would be appropriate.   Ritta Slot, MD [facility] [phone]  If 7pm- 7am, please page neurology on call at [phone].

## 2013-01-02 NOTE — ED Notes (Signed)
Pt sent here by Dr Sharyon Medicus, stating, "PT has clot in L thigh according to US doppler eval today.  Due to medical complexity including a CVA in Jan 2014".  He recommends the pt coming to the ER for eval and admission.  Pt just started on cipro yesterday for uti.  L foot with 3+ non-pitting edema.

## 2013-01-02 NOTE — ED Provider Notes (Signed)
History     CSN: [id number]  Arrival date & time 01/02/13  1417   First MD Initiated Contact with Patient 01/02/13 1601      Chief Complaint  Patient presents with  . DVT    HPI Pt was seen at 1600.   Per pt, c/o gradual onset and persistence of constant LLE "swelling" for the past several days.  Pt states had outpt Vasc US today and was told by her PMD to come to the ED "for admission because I have a clot."  Pt states she was started on cipro yesterday by her PMD for "a urine infection."  Denies CP/SOB, no abd pain, no N/V/D, no back pain, no injury.     Past Medical History  Diagnosis Date  . Dysphagia   . Atrial fibrillation     a. Dx 05/2011 at time of CVA.  Marland Kitchen Hyperlipidemia   . HTN (hypertension)   . Hypothyroidism   . Pneumonia     "alot of pneumonia" (09/21/2012)  . Syncope and collapse     "fell @ [facility]; hit left forehead"; left hip fx/notes 09/20/2012  . Vertigo   . Arthritis     "some; all over" (09/22/2011)  . Anxiety   . Depression   . Seizures      a. 05/2011 right MCA CVA in setting of new onset afib s/p thrombolysis/thrombolytics. b. Recurrent CVA 10/2011 in setting of subtherap.INR.  "only one she ever had was 3 months after her stroke" (09/21/2012  . Seizure 10/2011  . Stroke 05/2011    a. 05/2011 right MCA CVA in setting of new onset afib s/p thrombolysis/thrombolytics. b. Recurrent CVA 10/2011 in setting of subtherap.INR.    Past Surgical History  Procedure Laterality Date  . Lumbar disc surgery  1981  . Vaginal hysterectomy  1982  . Pleural scarification  1977?; 1986    "2 lung surgeries; my lungs had collapsed" (09/21/2012)  . Tonsillectomy and adenoidectomy  1950?  Marland Kitchen Appendectomy  1982  . Thrombolysis  05/2011    [name] 09/20/2012  . Hip arthroplasty  09/21/2012    Procedure: ARTHROPLASTY BIPOLAR HIP;  Surgeon: Kerrin Champagne, MD;  Location: [facility] OR;  Service: Orthopedics;  Laterality: Left;    Family History  Problem Relation Age of Onset  . CAD Neg Hx      History  Substance Use Topics  . Smoking status: Former Smoker -- 0.50 packs/day for 30 years    Types: Cigarettes    Quit date: 11/16/1979  . Smokeless tobacco: Never Used  . Alcohol Use: 0.6 oz/week    1 Glasses of wine per week     Comment: 09/21/2012 "glass of wine/wk"     Review of Systems ROS: Statement: All systems negative except as marked or noted in the HPI; Constitutional: Negative for fever and chills. ; ; Eyes: Negative for eye pain, redness and discharge. ; ; ENMT: Negative for ear pain, hoarseness, nasal congestion, sinus pressure and sore throat. ; ; Cardiovascular: Negative for chest pain, palpitations, diaphoresis, dyspnea and +peripheral edema. ; ; Respiratory: Negative for cough, wheezing and stridor. ; ; Gastrointestinal: Negative for nausea, vomiting, diarrhea, abdominal pain, blood in stool, hematemesis, jaundice and rectal bleeding. . ; ; Genitourinary: Negative for dysuria, flank pain and hematuria. ; ; Musculoskeletal: Negative for back pain and neck pain. Negative for swelling and trauma.; ; Skin: Negative for pruritus, rash, abrasions, blisters, bruising and skin lesion.; ; Neuro: Negative for headache, lightheadedness and neck stiffness. Negative  for weakness, altered level of consciousness , altered mental status, extremity weakness, paresthesias, involuntary movement, seizure and syncope.       Allergies  Review of patient's allergies indicates no known allergies.  Home Medications   Current Outpatient Rx  Name  Route  Sig  Dispense  Refill  . acetaminophen (TYLENOL) 325 MG tablet   Oral   Take 650 mg by mouth every 6 (six) hours as needed for pain.         Marland Kitchen ALPRAZolam (XANAX) 0.5 MG tablet   Oral   Take 0.5 mg by mouth at bedtime as needed for sleep or anxiety.         Marland Kitchen atorvastatin (LIPITOR) 10 MG tablet   Oral   Take 10 mg by mouth daily at 6 PM.         . barrier cream (NON-SPECIFIED) CREA   Topical   Apply 1 application topically  2 (two) times daily as needed (to bed sore).         . ciprofloxacin (CIPRO) 500 MG tablet   Oral   Take 500 mg by mouth 2 (two) times daily.         Marland Kitchen diltiazem (CARDIZEM CD) 120 MG 24 hr capsule   Oral   Take 1 capsule (120 mg total) by mouth daily.   30 capsule   1   . docusate sodium (COLACE) 100 MG capsule   Oral   Take 100 mg by mouth 2 (two) times daily as needed for constipation.         . feeding supplement (ENSURE COMPLETE) LIQD   Oral   Take 237 mLs by mouth 2 (two) times daily between meals.         Marland Kitchen HYDROcodone-acetaminophen (NORCO/VICODIN) 5-325 MG per tablet   Oral   Take 1 tablet by mouth every 6 (six) hours as needed for pain.         Marland Kitchen levETIRAcetam (KEPPRA) 500 MG tablet   Oral   Take 500 mg by mouth 2 (two) times daily.         Marland Kitchen levothyroxine (SYNTHROID, LEVOTHROID) 50 MCG tablet   Oral   Take 50 mcg by mouth every morning.          . Multiple Vitamin (MULITIVITAMIN WITH MINERALS) TABS   Oral   Take 1 tablet by mouth daily at 12 noon.         . polyethylene glycol (MIRALAX / GLYCOLAX) packet   Oral   Take 17 g by mouth daily as needed (constipation).            BP 109/63  Pulse 95  Temp(Src) 97.5 F (36.4 C) (Oral)  Resp 16  SpO2 98%  Physical Exam 1605: Physical examination:  Nursing notes reviewed; Vital signs and O2 SAT reviewed;  Constitutional: Thin, frail, In no acute distress; Head:  Normocephalic, atraumatic; Eyes: EOMI, PERRL, No scleral icterus; ENMT: Mouth and pharynx normal, Mucous membranes dry; Neck: Supple, Full range of motion, No lymphadenopathy; Cardiovascular: Irregular irregular rate and rhythm, No gallop; Respiratory: Breath sounds clear & equal bilaterally, No rales, rhonchi, wheezes.  Speaking full sentences with ease, Normal respiratory effort/excursion; Chest: Nontender, Movement normal; Abdomen: Soft, Nontender, Nondistended, Normal bowel sounds; Genitourinary: No CVA tenderness; Extremities: Pulses  normal, No tenderness, +3 LLE edema from foot to knee.; Neuro: AA&Ox3, Major CN grossly intact.  Speech clear. +left sided weakness per hx previous CVA, otherwise no apparent gross focal motor deficits in extremities.; Skin:  Color normal, Warm, Dry.   ED Course  Procedures    MDM  MDM Reviewed: previous chart, nursing note and vitals Interpretation: labs and ultrasound   Results for orders placed during the hospital encounter of 01/02/13  URINALYSIS, ROUTINE W REFLEX MICROSCOPIC      Result Value Range   Color, Urine YELLOW  YELLOW   APPearance CLEAR  CLEAR   Specific Gravity, Urine 1.019  1.005 - 1.030   pH 6.0  5.0 - 8.0   Glucose, UA NEGATIVE  NEGATIVE mg/dL   Hgb urine dipstick NEGATIVE  NEGATIVE   Bilirubin Urine NEGATIVE  NEGATIVE   Ketones, ur NEGATIVE  NEGATIVE mg/dL   Protein, ur NEGATIVE  NEGATIVE mg/dL   Urobilinogen, UA 0.2  0.0 - 1.0 mg/dL   Nitrite NEGATIVE  NEGATIVE   Leukocytes, UA NEGATIVE  NEGATIVE  BASIC METABOLIC PANEL      Result Value Range   Sodium 136  135 - 145 mEq/L   Potassium 3.7  3.5 - 5.1 mEq/L   Chloride 99  96 - 112 mEq/L   CO2 29  19 - 32 mEq/L   Glucose, Bld 95  70 - 99 mg/dL   BUN 14  6 - 23 mg/dL   Creatinine, Ser 1.61  0.50 - 1.10 mg/dL   Calcium 09.6  8.4 - 04.5 mg/dL   GFR calc non Af Amer 79 (*) >90 mL/min   GFR calc Af Amer >90  >90 mL/min  CBC WITH DIFFERENTIAL      Result Value Range   WBC 6.6  4.0 - 10.5 K/uL   RBC 4.33  3.87 - 5.11 MIL/uL   Hemoglobin 13.5  12.0 - 15.0 g/dL   HCT 40.9  81.1 - 91.4 %   MCV 90.1  78.0 - 100.0 fL   MCH 31.2  26.0 - 34.0 pg   MCHC 34.6  30.0 - 36.0 g/dL   RDW 78.2  95.6 - 21.3 %   Platelets 229  150 - 400 K/uL   Neutrophils Relative 67  43 - 77 %   Neutro Abs 4.4  1.7 - 7.7 K/uL   Lymphocytes Relative 19  12 - 46 %   Lymphs Abs 1.3  0.7 - 4.0 K/uL   Monocytes Relative 12  3 - 12 %   Monocytes Absolute 0.8  0.1 - 1.0 K/uL   Eosinophils Relative 2  0 - 5 %   Eosinophils Absolute 0.1  0.0  - 0.7 K/uL   Basophils Relative 0  0 - 1 %   Basophils Absolute 0.0  0.0 - 0.1 K/uL  APTT      Result Value Range   aPTT 30  24 - 37 seconds  PROTIME-INR      Result Value Range   Prothrombin Time 12.9  11.6 - 15.2 seconds   INR 0.98  0.00 - 1.49    *PRELIMINARY RESULTS*  Vascular Ultrasound  Left lower extremity venous duplex has been completed. Preliminary findings: Left = DVT involving the left common femoral vein.  Patient instructed to go to [facility] at 4:20pm.  Farrel Demark, RDMS, RVT  01/02/2013, 12:49 PM   1730:  PMD wanted pt admitted to due complicated PMHx.  Pt with hx several CVA, including hemorrhagic CVA 3 months ago. Will need clarification by Neuro service if anticoagulation for DVT is safe at this point. Dx and testing d/w pt and family.  Questions answered.  Verb understanding, agreeable to admit.  T/C to [facility]  Dr. Benjamine Mola, case discussed, including:  HPI, pertinent PM/SHx, VS/PE, dx testing, ED course and treatment:  Agreeable to observation admit, requests to write temporary orders, obtain medical bed to team 9.   Laray Anger, DO 01/04/13 [phone]

## 2013-01-03 LAB — CBC
HCT: 33.9 % — ABNORMAL LOW (ref 36.0–46.0)
MCH: 30.4 pg (ref 26.0–34.0)
MCV: 88.7 fL (ref 78.0–100.0)
RDW: 14.7 % (ref 11.5–15.5)
WBC: 5.7 10*3/uL (ref 4.0–10.5)

## 2013-01-03 LAB — URINE CULTURE
Colony Count: NO GROWTH
Culture: NO GROWTH

## 2013-01-03 LAB — BASIC METABOLIC PANEL
CO2: 29 mEq/L (ref 19–32)
Calcium: 9.1 mg/dL (ref 8.4–10.5)
Chloride: 106 mEq/L (ref 96–112)
Creatinine, Ser: 0.6 mg/dL (ref 0.50–1.10)
Glucose, Bld: 90 mg/dL (ref 70–99)

## 2013-01-03 MED ORDER — RIVAROXABAN 15 MG PO TABS
15.0000 mg | ORAL_TABLET | Freq: Two times a day (BID) | ORAL | Status: DC
  Administered 2013-01-03: 15 mg via ORAL
  Filled 2013-01-03 (×2): qty 1

## 2013-01-03 MED ORDER — RIVAROXABAN 15 MG PO TABS: 15.0000 mg | ORAL_TABLET | Freq: Two times a day (BID) | ORAL | Status: DC

## 2013-01-03 MED ORDER — CIPROFLOXACIN HCL 500 MG PO TABS
500.0000 mg | ORAL_TABLET | Freq: Two times a day (BID) | ORAL | Status: DC
Start: 2013-01-03 — End: 2013-01-03
  Administered 2013-01-03: 500 mg via ORAL
  Filled 2013-01-03: qty 1

## 2013-01-03 MED ORDER — RIVAROXABAN 15 MG PO TABS: 15.0000 mg | ORAL_TABLET | Freq: Two times a day (BID) | ORAL | Status: AC

## 2013-01-03 NOTE — Progress Notes (Signed)
I spoke with Dr. Kemper Durie of Radiology who states resolution of interventricular hemorrhage and no new bleeding present.  Will start lovenox and coumadin bridging.  Likely will need 3-6 months of Couamdin.  Pleas Koch, MD [facility] [phone]

## 2013-01-03 NOTE — Progress Notes (Signed)
Patient and husband given discharge instructions.  They verbalized understanding.  Prescriptions already faxed to [facility].  It requires prior authorization, but Dr. Mahala Menghini has already spoken to the pharmacy and he has the forms to fill out and he will complete those today.  Spoke with Consuella Lose from [facility] to ensure that care was set up for pt when she returned home.  Pt being discharged with her husband back to [facility] (independent apt).

## 2013-01-03 NOTE — Discharge Summary (Signed)
Physician Discharge Summary  Cherylene Ferrufino Omeara MWN:[id number] DOB: 06-May-1931 DOA: 01/02/2013  PCP: [name], MD  Admit date: 01/02/2013 Discharge date: 01/03/2013  Time spent: 40 minutes  Recommendations for Outpatient Follow-up:  1. Patient to be on Xarelto 15 mg bid till 01/24/13-then switch to 20 mg daily-likely indefinitely 2. Complete course of Cipro started 4.15.14  3. Recommend follow-up with Dr. Antoine Poche to determine further discussion about Anticoagulation from a cardiology perspective-she should only 3-6 months Xarelto for DVT  Discharge Diagnoses:  Principal Problem:   DVT (deep venous thrombosis) Active Problems:   Atrial fibrillation   History of CVA (cerebrovascular accident)   Hypothyroidism   HTN (hypertension)   Seizure   IVH (intraventricular hemorrhage)   Discharge Condition: Fair  Diet recommendation: Regular  There were no vitals filed for this visit.  History of present illness:  This 79 year old female history of atrial fibrillation not on that for the, hypothyroidism, fall in January 2014 with subsequent hemorrhage of left lateral ventricle presented from premature physician is office with an acute left femoral DVT Patient has not been on her anticoagulation because of intracranial hemorrhage and she never followed up with neurology. Issue is admitted to the hospital and neurology clearance was date subsequent to the brain that showed chronic MCA hemorrhage with no acute findings. Neurology saw the patient and cleared her for anticoagulation. I have placed her on Xarelto 15mg  twice a day-this will need to be changed to 20 mg daily for another 3-6 months. She'll need followup with her primary care physician for further  Discharge Exam: Filed Vitals:   01/02/13 1845 01/02/13 1947 01/02/13 2141 01/03/13 0520  BP: 112/63 111/68 126/61 121/75  Pulse: 94 95 89 103  Temp:  98.1 F (36.7 C) 98.4 F (36.9 C) 98.4 F (36.9 C)  TempSrc:  Oral Oral Oral   Resp:   18 16  Height:  5\' 8"  (1.727 m)    SpO2: 99% 98% 97% 98%    General: Alert pleasant irritable Cardiovascular: S1-S2 no murmur rub or gallop, A. fib Respiratory: Clear Extremity seems swollen  Discharge Instructions  Discharge Orders   Future Appointments Provider Department Dept Phone   01/10/2013 3:00 PM Rollene Rotunda, MD  [facility] [place]) [phone]   Future Orders Complete By Expires     Diet - low sodium heart healthy  As directed     Increase activity slowly  As directed         Medication List    TAKE these medications       acetaminophen 325 MG tablet  Commonly known as:  TYLENOL  Take 650 mg by mouth every 6 (six) hours as needed for pain.     ALPRAZolam 0.5 MG tablet  Commonly known as:  XANAX  Take 0.5 mg by mouth at bedtime as needed for sleep or anxiety.     atorvastatin 10 MG tablet  Commonly known as:  LIPITOR  Take 10 mg by mouth daily at 6 PM.     barrier cream Crea  Commonly known as:  non-specified  Apply 1 application topically 2 (two) times daily as needed (to bed sore).     ciprofloxacin 500 MG tablet  Commonly known as:  CIPRO  Take 500 mg by mouth 2 (two) times daily.     diltiazem 120 MG 24 hr capsule  Commonly known as:  CARDIZEM CD  Take 1 capsule (120 mg total) by mouth daily.     docusate sodium 100 MG  capsule  Commonly known as:  COLACE  Take 100 mg by mouth 2 (two) times daily as needed for constipation.     feeding supplement Liqd  Take 237 mLs by mouth 2 (two) times daily between meals.     HYDROcodone-acetaminophen 5-325 MG per tablet  Commonly known as:  NORCO/VICODIN  Take 1 tablet by mouth every 6 (six) hours as needed for pain.     levETIRAcetam 500 MG tablet  Commonly known as:  KEPPRA  Take 500 mg by mouth 2 (two) times daily.     levothyroxine 50 MCG tablet  Commonly known as:  SYNTHROID, LEVOTHROID  Take 50 mcg by mouth every morning.     multivitamin with minerals Tabs  Take  1 tablet by mouth daily at 12 noon.     polyethylene glycol packet  Commonly known as:  MIRALAX / GLYCOLAX  Take 17 g by mouth daily as needed (constipation).     Rivaroxaban 15 MG Tabs tablet  Commonly known as:  XARELTO  Take 1 tablet (15 mg total) by mouth 2 (two) times daily.          The results of significant diagnostics from this hospitalization (including imaging, microbiology, ancillary and laboratory) are listed below for reference.    Significant Diagnostic Studies: Mr Brain Wo Contrast  01/03/2013  *RADIOLOGY REPORT*  Clinical Data: History of hemorrhagic CVA femur 2014.  Left leg DVT  MRI HEAD WITHOUT CONTRAST  Technique:  Multiplanar, multiecho pulse sequences of the brain and surrounding structures were obtained according to standard protocol without intravenous contrast.  Comparison: CT 09/25/2012  Findings: Chronic hemorrhagic infarct right MCA territory.  No acute infarct and no acute intracranial hemorrhage.  Generalized atrophy.  Negative for hydrocephalus.  Negative for mass lesion.  Brainstem and cerebellum are intact.  Paranasal sinuses are clear.  IMPRESSION: Chronic hemorrhagic right MCA infarct.  No acute abnormality.   Original Report Authenticated By: Janeece Riggers, M.D.    Dg Swallowing Func-speech Pathology  12/31/2012  Riley Nearing Deblois, CCC-SLP     12/31/2012  1:02 PM Objective Swallowing Evaluation: Modified Barium Swallowing Study   Patient Details  Name: ESTRELLITA LASKY MRN: [id number] Date of Birth: 1930/11/14  Today's Date: 12/31/2012 Time: 1110-1140 SLP Time Calculation (min): 30 min  Past Medical History:  Past Medical History  Diagnosis Date  . Dysphagia   . Atrial fibrillation     a. Dx 05/2011 at time of CVA.  Marland Kitchen Hyperlipidemia   . HTN (hypertension)   . Hypothyroidism   . Pneumonia     "alot of pneumonia" (09/21/2012)  . Syncope and collapse     "fell @ [place]; hit left forehead"; left hip fx/notes  09/20/2012  . Vertigo   . Arthritis     "some; all  over" (09/22/2011)  . Anxiety   . Depression   . Seizures      a. 05/2011 right MCA CVA in setting of new onset afib s/p  thrombolysis/thrombolytics. b. Recurrent CVA 10/2011 in setting of  subtherap.INR.  "only one she ever had was 3 months after her  stroke" (09/21/2012  . Seizure 10/2011  . Stroke 05/2011    a. 05/2011 right MCA CVA in setting of new onset afib s/p  thrombolysis/thrombolytics. b. Recurrent CVA 10/2011 in setting of  subtherap.INR.   Past Surgical History:  Past Surgical History  Procedure Laterality Date  . Lumbar disc surgery  1981  . Vaginal hysterectomy  1982  . Pleural scarification  1977?;  1986    "2 lung surgeries; my lungs had collapsed" (09/21/2012)  . Tonsillectomy and adenoidectomy  1950?  Marland Kitchen Appendectomy  1982  . Thrombolysis  05/2011    [name] 09/20/2012  . Hip arthroplasty  09/21/2012    Procedure: ARTHROPLASTY BIPOLAR HIP;  Surgeon: Kerrin Champagne,  MD;  Location: [facility] OR;  Service: Orthopedics;  Laterality: Left;   HPI:  Pt is a 79 year old female arriving for an outpatient MBS for a  possible diet upgrade from nectar thick liquids. Pt is from  [facility]. Pt known to this SLP from previous admission in  1/14 for Hip hemiarthroplasty following a fall. At that time pt  was severely deconditioned. Recommended to consume Dys 2/nectar  due to a baseline structural dysphagia that she could no longer  compensate for. PMH: large old right CVA, acute right  frontal/temporal infarct 2/13, dysphagia with 3 MBS' between  05/24/11-06/08/11. MBS 06/08/11 revealed flash penetration with thin  liquids with recommendation of DYS 2 thin liquids, double  swallows and intermittent throat clear.      Assessment / Plan / Recommendation Clinical Impression  Dysphagia Diagnosis: Moderate cervical esophageal phase  dysphagia;Moderate pharyngeal phase dysphagia Clinical impression: Pt presents with what is likely a return to  baseline function following acute hospital stay earlier this  year. She demosntrates a primary  structural, cervical esophageal  dysphagia impacting pharyngeal function. There is limited opneing  of UES for transit of bolus, probably due to appearance of  curvature in cervical spine (no radiologist present to confirm).  With reduced opening ot UES there is limited ability of  pharyngeal strength and mobility to transit bolus with moderate  residuals remaining post swallow requiring 3-6 swallow to transit  each bolus (pt is not aware of multiple swallows though she does  them reflexively). An attempted chin tuck lead to silent  penetration to vocal cords. The best strategy was increasing  bolus size/weight which increased opening of UES and bolus  transit. Pt is recommended to consume a regular diet and thin  liquids with moderate to large sips/bites. She should alternate  between solids and liquids. The pt also reports she has not been  working with an SLP since d/c from hospital. She would benefit  from SLP therapy for pharyngeal strengthening exercises to  improve function. Eduated pt and husband to be aware that swallow  function also likely to worsen with significant  illness/debilitation. Aspiration risk at baseline is mild to  moderate.     Treatment Recommendation  Defer treatment plan to SLP at (Comment) (SNF)    Diet Recommendation Regular;Thin liquid   Liquid Administration via: Cup;Straw Medication Administration: Whole meds with liquid Supervision: Patient able to self feed Compensations: Slow rate;Multiple dry swallows after each  bite/sip;Follow solids with liquid Postural Changes and/or Swallow Maneuvers: Seated upright 90  degrees    Other  Recommendations     Follow Up Recommendations  Skilled Nursing facility    Frequency and Duration        Pertinent Vitals/Pain NA    SLP Swallow Goals     General HPI: Pt is a 79 year old female arriving for an  outpatient MBS for a possible diet upgrade from nectar thick  liquids. Pt is from [facility]. Pt known to this SLP from  previous admission in  1/14 for Hip hemiarthroplasty following a  fall. At that time pt was severely deconditioned. Recommended to  consume Dys 2/nectar due to a baseline structural  dysphagia that  she could no longer compensate for. PMH: large old right CVA,  acute right frontal/temporal infarct 2/13, dysphagia with 3 MBS'  between 05/24/11-06/08/11. MBS 06/08/11 revealed flash penetration  with thin liquids with recommendation of DYS 2 thin liquids,  double swallows and intermittent throat clear.  Type of Study: Modified Barium Swallowing Study Previous Swallow Assessment: 09/25/12 - Dys2, nectar Diet Prior to this Study: Dysphagia 2 (chopped);Nectar-thick  liquids Temperature Spikes Noted: N/A Respiratory Status: Room air History of Recent Intubation: No Behavior/Cognition: Alert;Cooperative Oral Cavity - Dentition: Adequate natural dentition Oral Motor / Sensory Function: Impaired motor Oral impairment: Left lingual;Left facial;Left labial Patient Positioning: Upright in chair Baseline Vocal Quality: Clear Volitional Cough: Strong Volitional Swallow: Able to elicit Anatomy: Other (Comment) Pharyngeal Secretions:  (Appearance of dramatic curvature of  spine at level of UES)    Reason for Referral     Oral Phase Oral Preparation/Oral Phase Oral Phase: WFL   Pharyngeal Phase Pharyngeal Phase Pharyngeal Phase: Impaired Pharyngeal - Thin Pharyngeal - Thin Teaspoon: Reduced pharyngeal  peristalsis;Reduced epiglottic inversion;Reduced anterior  laryngeal mobility;Reduced laryngeal elevation;Reduced tongue  base retraction;Pharyngeal residue - valleculae;Pharyngeal  residue - pyriform sinuses Pharyngeal - Thin Cup: Reduced pharyngeal peristalsis;Reduced  epiglottic inversion;Reduced anterior laryngeal mobility;Reduced  laryngeal elevation;Reduced tongue base retraction;Pharyngeal  residue - valleculae;Pharyngeal residue - pyriform  sinuses;Penetration/Aspiration during swallow (penetration with a  chin tuck) Penetration/Aspiration details (thin  cup): Material enters  airway, remains ABOVE vocal cords and not ejected out;Material  does not enter airway Pharyngeal - Thin Straw: Reduced pharyngeal peristalsis;Reduced  epiglottic inversion;Reduced anterior laryngeal mobility;Reduced  laryngeal elevation;Reduced tongue base retraction;Pharyngeal  residue - valleculae;Pharyngeal residue - pyriform sinuses Pharyngeal - Solids Pharyngeal - Puree: Reduced pharyngeal peristalsis;Reduced  epiglottic inversion;Reduced anterior laryngeal mobility;Reduced  laryngeal elevation;Reduced tongue base retraction;Pharyngeal  residue - valleculae;Pharyngeal residue - pyriform sinuses Pharyngeal - Regular: Reduced pharyngeal peristalsis;Reduced  epiglottic inversion;Reduced anterior laryngeal mobility;Reduced  laryngeal elevation;Reduced tongue base retraction;Pharyngeal  residue - valleculae;Pharyngeal residue - pyriform sinuses Pharyngeal - Pill: Reduced pharyngeal peristalsis;Reduced  epiglottic inversion;Reduced anterior laryngeal mobility;Reduced  laryngeal elevation;Reduced tongue base retraction;Pharyngeal  residue - valleculae;Pharyngeal residue - pyriform sinuses  Cervical Esophageal Phase    GO    Cervical Esophageal Phase Cervical Esophageal Phase: Impaired Cervical Esophageal Phase - Comment Cervical Esophageal Comment: limited opening of UES with  appearance that curvature of spine adding additional pressure to  UES.     Functional Assessment Tool Used: clinical judgement Functional Limitations: Swallowing Swallow Current Status (Z6109): At least 20 percent but less than  40 percent impaired, limited or restricted Swallow Goal Status [id number]): At least 20 percent but less than 40  percent impaired, limited or restricted Swallow Discharge Status [phone]): At least 20 percent but less  than 40 percent impaired, limited or restricted    DeBlois, Riley Nearing 12/31/2012, 1:00 PM      Microbiology: No results found for this or any previous visit (from the past 240  hour(s)).   Labs: Basic Metabolic Panel:  Recent Labs Lab 01/02/13 1612 01/03/13 0500  NA 136 140  K 3.7 3.2*  CL 99 106  CO2 29 29  GLUCOSE 95 90  BUN 14 9  CREATININE 0.73 0.60  CALCIUM 10.1 9.1   Liver Function Tests: No results found for this basename: AST, ALT, ALKPHOS, BILITOT, PROT, ALBUMIN,  in the last 168 hours No results found for this basename: LIPASE, AMYLASE,  in the last 168 hours No results found for this basename: AMMONIA,  in  the last 168 hours CBC:  Recent Labs Lab 01/02/13 1612 01/03/13 0500  WBC 6.6 5.7  NEUTROABS 4.4  --   HGB 13.5 11.6*  HCT 39.0 33.9*  MCV 90.1 88.7  PLT 229 225   Cardiac Enzymes: No results found for this basename: CKTOTAL, CKMB, CKMBINDEX, TROPONINI,  in the last 168 hours BNP: BNP (last 3 results) No results found for this basename: PROBNP,  in the last 8760 hours CBG: No results found for this basename: GLUCAP,  in the last 168 hours     Signed:  Rhetta Mura  [facility] 01/03/2013, 10:49 AM

## 2013-01-03 NOTE — Progress Notes (Signed)
Stroke Team Progress Note  HISTORY [name] is an 79 y.o. female who fell in January and sustained left hip fracture. She was already on coumadin for her atrial fibrillation. Post op, she was bridged with IV heparin and began to have left sided weakness. Heparin was stopped and her INR was reversed. MR confirmed an acute hemorrhage of the left lateral ventricle. She was reversed and has been on no anticoagulations. She was then discharged to SNF and recently came home. She is doing home rehab. She confirms that she has had left sided weakness. She has a left facial droop which her husband thinks is not new, but her sitter says is new. Patient/husband deny having any further follow up head scans since discharge in January. She started having left calf pain and swelling over the past 2 days. She had u/s as outpt and diagnosed with DVT. She was admitted for anticoagulation. She was admitted for further evaluation and treatment.  SUBJECTIVE No family is at the bedside.  Overall she feels her condition is stable, though she is frustrated with bed rest.  OBJECTIVE Most recent Vital Signs: Filed Vitals:   01/02/13 1845 01/02/13 1947 01/02/13 2141 01/03/13 0520  BP: 112/63 111/68 126/61 121/75  Pulse: 94 95 89 103  Temp:  98.1 F (36.7 C) 98.4 F (36.9 C) 98.4 F (36.9 C)  TempSrc:  Oral Oral Oral  Resp:   18 16  Height:  5\' 8"  (1.727 m)    SpO2: 99% 98% 97% 98%   CBG (last 3)  No results found for this basename: GLUCAP,  in the last 72 hours  IV Fluid Intake:     MEDICATIONS  . atorvastatin  10 mg Oral q1800  . diltiazem  120 mg Oral Daily  . feeding supplement  237 mL Oral BID BM  . levETIRAcetam  500 mg Oral BID  . levothyroxine  50 mcg Oral QAC breakfast  . multivitamin with minerals  1 tablet Oral Q1200  . sodium chloride  3 mL Intravenous Q12H   PRN:  sodium chloride, acetaminophen, acetaminophen, ALPRAZolam, barrier cream, docusate sodium, HYDROcodone-acetaminophen,  ondansetron (ZOFRAN) IV, ondansetron, polyethylene glycol, sodium chloride  Diet:  Cardiac thin liquids Activity:  Bedrest DVT Prophylaxis:  None ordered, on  CLINICALLY SIGNIFICANT STUDIES Basic Metabolic Panel:  Recent Labs Lab 01/02/13 1612 01/03/13 0500  NA 136 140  K 3.7 3.2*  CL 99 106  CO2 29 29  GLUCOSE 95 90  BUN 14 9  CREATININE 0.73 0.60  CALCIUM 10.1 9.1   Liver Function Tests: No results found for this basename: AST, ALT, ALKPHOS, BILITOT, PROT, ALBUMIN,  in the last 168 hours CBC:  Recent Labs Lab 01/02/13 1612 01/03/13 0500  WBC 6.6 5.7  NEUTROABS 4.4  --   HGB 13.5 11.6*  HCT 39.0 33.9*  MCV 90.1 88.7  PLT 229 225   Coagulation:  Recent Labs Lab 01/02/13 1612  LABPROT 12.9  INR 0.98   Cardiac Enzymes: No results found for this basename: CKTOTAL, CKMB, CKMBINDEX, TROPONINI,  in the last 168 hours Urinalysis:  Recent Labs Lab 01/02/13 1657  COLORURINE YELLOW  LABSPEC 1.019  PHURINE 6.0  GLUCOSEU NEGATIVE  HGBUR NEGATIVE  BILIRUBINUR NEGATIVE  KETONESUR NEGATIVE  PROTEINUR NEGATIVE  UROBILINOGEN 0.2  NITRITE NEGATIVE  LEUKOCYTESUR NEGATIVE   Lipid Panel    Component Value Date/Time   CHOL 213* 11/16/2011 0610   TRIG 67 11/16/2011 0610   HDL 89 11/16/2011 0610   CHOLHDL 2.4  11/16/2011 0610   VLDL 13 11/16/2011 0610   LDLCALC 111* 11/16/2011 0610   HgbA1C  Lab Results  Component Value Date   HGBA1C 5.5 11/15/2011    Urine Drug Screen:     Component Value Date/Time   LABOPIA NONE DETECTED 11/16/2011 0605   COCAINSCRNUR NONE DETECTED 11/16/2011 0605   LABBENZ NONE DETECTED 11/16/2011 0605   AMPHETMU NONE DETECTED 11/16/2011 0605   THCU NONE DETECTED 11/16/2011 0605   LABBARB NONE DETECTED 11/16/2011 0605    Alcohol Level: No results found for this basename: ETH,  in the last 168 hours  MRI of the brain  01/03/2013   Chronic hemorrhagic right MCA infarct.  No acute abnormality.   Left LE Doppler Left = DVT involving the left common  femoral vein.  Therapy Recommendations   Physical Exam  General: The patient is alert and cooperative at the time of the examination.  Skin: No significant peripheral edema is noted on the right leg, 1 plus on the left.   Neurologic Exam  Cranial nerves: Facial symmetry is present. Speech is normal, no aphasia or dysarthria is noted. Extraocular movements are full. Visual fields are full.  Motor: The patient has good strength in all 4 extremities, with only minimal weakness of the left arm.  Coordination: The patient has good finger-nose-finger and heel-to-shin bilaterally.  Gait and station: The gait was not tested. No drift is seen.  Reflexes: Deep tendon reflexes are slightly brisk on the left arm and leg.      ASSESSMENT [name] is a 79 y.o. female presenting with ? New left hemiparesis worsening, left leg DVT. No new infarct. IVH from January secondary to trauma; intraventricular hemorrhage now completely resolved.  Right ischemic brain stroke old, 2012. On no antiplatelets/anticoagulatns prior to admission. Patient with resultant old left hemiparesis.  Hospital day # 1  TREATMENT/PLAN  Ok to treat with anticoagulant from neuro standpoint no contraindication.   No stroke workup indicated  No further neuro followup recommended  Stroke team will sign off. Please call for questions.  April Main, MSN, RN, ANVP-BC, ANP-BC, [name] Pager: [phone] 01/03/2013 10:01 AM  I have personally obtained a history, examined the patient, evaluated imaging results, and formulated the assessment and plan of care. I agree with the above. [name]

## 2013-01-03 NOTE — Progress Notes (Signed)
ANTICOAGULATION CONSULT NOTE - Initial Consult  Pharmacy Consult for LMWH and coumadin Indication: DVT  No Known Allergies  Patient Measurements: Height: 5\' 8"  (172.7 cm) IBW/kg (Calculated) : 63.9 Heparin Dosing Weight: 43.3 kg TBW as of 12/10/12  Vital Signs: Temp: 98.4 F (36.9 C) (04/17 0520) Temp src: Oral (04/17 0520) BP: 121/75 mmHg (04/17 0520) Pulse Rate: 103 (04/17 0520)  Labs:  Recent Labs  01/02/13 1612 01/03/13 0500  HGB 13.5 11.6*  HCT 39.0 33.9*  PLT 229 225  APTT 30  --   LABPROT 12.9  --   INR 0.98  --   CREATININE 0.73 0.60    The CrCl is unknown because both a height and weight (above a minimum accepted value) are required for this calculation.   Medical History: Past Medical History  Diagnosis Date  . Dysphagia   . Atrial fibrillation     a. Dx 05/2011 at time of CVA.  Marland Kitchen Hyperlipidemia   . HTN (hypertension)   . Hypothyroidism   . Pneumonia     "alot of pneumonia" (09/21/2012)  . Syncope and collapse     "fell @ [facility]; hit left forehead"; left hip fx/notes 09/20/2012  . Vertigo   . Arthritis     "some; all over" (09/22/2011)  . Anxiety   . Depression   . Seizures      a. 05/2011 right MCA CVA in setting of new onset afib s/p thrombolysis/thrombolytics. b. Recurrent CVA 10/2011 in setting of subtherap.INR.  "only one she ever had was 3 months after her stroke" (09/21/2012  . Seizure 10/2011  . Stroke 05/2011    a. 05/2011 right MCA CVA in setting of new onset afib s/p thrombolysis/thrombolytics. b. Recurrent CVA 10/2011 in setting of subtherap.INR.  . DVT of lower extremity (deep venous thrombosis) 01/02/2013    LLE    Medications:  Prescriptions prior to admission  Medication Sig Dispense Refill  . acetaminophen (TYLENOL) 325 MG tablet Take 650 mg by mouth every 6 (six) hours as needed for pain.      Marland Kitchen ALPRAZolam (XANAX) 0.5 MG tablet Take 0.5 mg by mouth at bedtime as needed for sleep or anxiety.      Marland Kitchen atorvastatin (LIPITOR) 10 MG tablet  Take 10 mg by mouth daily at 6 PM.      . barrier cream (NON-SPECIFIED) CREA Apply 1 application topically 2 (two) times daily as needed (to bed sore).      . ciprofloxacin (CIPRO) 500 MG tablet Take 500 mg by mouth 2 (two) times daily.      Marland Kitchen diltiazem (CARDIZEM CD) 120 MG 24 hr capsule Take 1 capsule (120 mg total) by mouth daily.  30 capsule  1  . docusate sodium (COLACE) 100 MG capsule Take 100 mg by mouth 2 (two) times daily as needed for constipation.      . feeding supplement (ENSURE COMPLETE) LIQD Take 237 mLs by mouth 2 (two) times daily between meals.      Marland Kitchen HYDROcodone-acetaminophen (NORCO/VICODIN) 5-325 MG per tablet Take 1 tablet by mouth every 6 (six) hours as needed for pain.      Marland Kitchen levETIRAcetam (KEPPRA) 500 MG tablet Take 500 mg by mouth 2 (two) times daily.      Marland Kitchen levothyroxine (SYNTHROID, LEVOTHROID) 50 MCG tablet Take 50 mcg by mouth every morning.       . Multiple Vitamin (MULITIVITAMIN WITH MINERALS) TABS Take 1 tablet by mouth daily at 12 noon.      Marland Kitchen  polyethylene glycol (MIRALAX / GLYCOLAX) packet Take 17 g by mouth daily as needed (constipation).         Assessment: 79 yo female with hx of atrial fib and CVA in September of 2012 w/ recurrent CVA 10/2011 in setting of subtx INR.   She fell in January and had L hip fx.  Post-op she had an acute hemorrhage of left ventricle and coumadin was reversed and she has been on no anticoagulation.  She has a new LLE DVT in setting of previous IVH  And SDH in Jan 2014.   When she was admitted with hip fx in January her admission INR was therapeutic on home dose of 7.5 mg daily.   Her creat cl is OK for once daily LMWH.  Her H/H is 11.6/33.9 down from 13.5/39 on admission.  Her PLTC  Of 225 is wnl.  Baseline INR wnl.   Per MD notes, MRI shows resolution of IVH and no new bleeding present. To start LMWH/coumadin bridging. Will likely need 3-6 months of coumadin per MD note.  Today is day # 1/5 of minimum required overlap of coumadin and  LMWH for VTE.  Her wt has decreased to 43.3 kg. Wt recorded as 164 lbs elsewhere?   Goal of Therapy:  INR 2-3 Anti-Xa level 0.6-1.2 units/ml 4hrs after LMWH dose given Monitor platelets by anticoagulation protocol: Yes   Plan:  LMWH and coumadin orders dc'd and xarelto 15 mg po bid x 42 doses ordered. Dose OK for renal fxn. After 3 weeks of 15 mg BID dose should be changed to 20 mg daily with evening meal Herby Abraham, Pharm.D. [phone] 01/03/2013 10:48 AM

## 2013-01-03 NOTE — Progress Notes (Signed)
Utilization review completed. Maimuna Leaman, RN, BSN. 

## 2013-01-03 NOTE — Clinical Social Work Psychosocial (Signed)
     Clinical Social Work Department BRIEF PSYCHOSOCIAL ASSESSMENT 01/03/2013  Patient:  [name], [name]     Account Number:  [id number]     Admit date:  01/02/2013  Clinical Social Worker:  Hulan Fray  Date/Time:  01/03/2013 11:51 AM  Referred by:  RN  Date Referred:  01/02/2013 Referred for  Other - See comment   Other Referral:   Admit from facility   Interview type:  Patient Other interview type:    PSYCHOSOCIAL DATA Living Status:  FACILITY Admitted from facility:  [facility] Level of care:  Independent Living Primary support name:  Rueben Lemler Primary support relationship to patient:  SPOUSE Degree of support available:   supportive    CURRENT CONCERNS Current Concerns  None Noted   Other Concerns:    SOCIAL WORK ASSESSMENT / PLAN Clincial Social Worker received referral for patient being admitted from [facility] facility. CSW introduced self and explained reason for visit and patient's husband was at bedside. Patient reported that she is from [facility] section. CSW spoke with Alfredia Client at facility who confirmed that patient is residing on the independent side and is active with [place] home health services. Husband reported that he has his private vehicle and will transport her back to facility.    CSW will sign off, as social work intervention is no longer needed.   Assessment/plan status:  No Further Intervention Required Other assessment/ plan:   Information/referral to community resources:   Patient is from facility    PATIENTS/FAMILYS RESPONSE TO PLAN OF CARE: Patient reported that she is from [facility] and plans to return back via private vehicle.

## 2013-01-10 ENCOUNTER — Ambulatory Visit (INDEPENDENT_AMBULATORY_CARE_PROVIDER_SITE_OTHER): Payer: Medicare Other | Admitting: Cardiology

## 2013-01-10 ENCOUNTER — Encounter: Payer: Self-pay | Admitting: Cardiology

## 2013-01-10 VITALS — BP 114/60 | HR 82 | Ht 68.0 in | Wt 91.4 lb

## 2013-01-10 DIAGNOSIS — I4891 Unspecified atrial fibrillation: Secondary | ICD-10-CM

## 2013-01-10 DIAGNOSIS — I10 Essential (primary) hypertension: Secondary | ICD-10-CM

## 2013-01-10 NOTE — Progress Notes (Signed)
HPI The patient presents for followup after having a series of medical problems. The most recent was a DVT. She had been off anticoagulation following a hemorrhagic CVA. She's in a nursing home trying to recover from hip fracture. She actually looks a little bit better than I saw her before and she has less facial droop. She's doing a little bit better with physical therapy. Her mood seems to be brighter. She is noticing occasional palpitations but she's had no further syncopal episodes or falls such as she had at the time of her hip fracture. She denies any chest pressure, neck or arm discomfort. She's not having any PND or orthopnea.  No Known Allergies  Current Outpatient Prescriptions  Medication Sig Dispense Refill  . acetaminophen (TYLENOL) 325 MG tablet Take 650 mg by mouth every 6 (six) hours as needed for pain.      Marland Kitchen ALPRAZolam (XANAX) 0.5 MG tablet Take 0.5 mg by mouth at bedtime as needed for sleep or anxiety.      Marland Kitchen atorvastatin (LIPITOR) 10 MG tablet Take 10 mg by mouth daily at 6 PM.      . barrier cream (NON-SPECIFIED) CREA Apply 1 application topically 2 (two) times daily as needed (to bed sore).      . ciprofloxacin (CIPRO) 500 MG tablet Take 500 mg by mouth 2 (two) times daily.      Marland Kitchen diltiazem (CARDIZEM CD) 120 MG 24 hr capsule Take 1 capsule (120 mg total) by mouth daily.  30 capsule  1  . docusate sodium (COLACE) 100 MG capsule Take 100 mg by mouth 2 (two) times daily as needed for constipation.      . feeding supplement (ENSURE COMPLETE) LIQD Take 237 mLs by mouth 2 (two) times daily between meals.      Marland Kitchen HYDROcodone-acetaminophen (NORCO/VICODIN) 5-325 MG per tablet Take 1 tablet by mouth every 6 (six) hours as needed for pain.      Marland Kitchen levETIRAcetam (KEPPRA) 500 MG tablet Take 500 mg by mouth 2 (two) times daily.      Marland Kitchen levothyroxine (SYNTHROID, LEVOTHROID) 50 MCG tablet Take 50 mcg by mouth every morning.       . Multiple Vitamin (MULITIVITAMIN WITH MINERALS) TABS Take 1  tablet by mouth daily at 12 noon.      . polyethylene glycol (MIRALAX / GLYCOLAX) packet Take 17 g by mouth daily as needed (constipation).       . Rivaroxaban (XARELTO) 15 MG TABS tablet Take 1 tablet (15 mg total) by mouth 2 (two) times daily.  42 tablet  0   No current facility-administered medications for this visit.    Past Medical History  Diagnosis Date  . Dysphagia   . Atrial fibrillation     a. Dx 05/2011 at time of CVA.  Marland Kitchen Hyperlipidemia   . HTN (hypertension)   . Hypothyroidism   . Pneumonia     "alot of pneumonia" (09/21/2012)  . Syncope and collapse     "fell @ [facility]; hit left forehead"; left hip fx/notes 09/20/2012  . Vertigo   . Arthritis     "some; all over" (09/22/2011)  . Anxiety   . Depression   . Seizures      a. 05/2011 right MCA CVA in setting of new onset afib s/p thrombolysis/thrombolytics. b. Recurrent CVA 10/2011 in setting of subtherap.INR.  "only one she ever had was 3 months after her stroke" (09/21/2012  . Seizure 10/2011  . Stroke 05/2011    a.  05/2011 right MCA CVA in setting of new onset afib s/p thrombolysis/thrombolytics. b. Recurrent CVA 10/2011 in setting of subtherap.INR.  . DVT of lower extremity (deep venous thrombosis) 01/02/2013    LLE    ROS:   As stated in the HPI and negative for all other systems.  PHYSICAL EXAM BP 114/60  Pulse 82  Ht 5\' 8"  (1.727 m)  Wt 91 lb 6.4 oz (41.459 kg)  BMI 13.9 kg/m2 PHYSICAL EXAM GEN:  No distress, very frail HEENT: Continued resolving extensive ecchymosis but improved NECK:  No jugular venous distention at 90 degrees, waveform within normal limits, carotid upstroke brisk and symmetric, no bruits, no thyromegaly LYMPHATICS:  No cervical adenopathy LUNGS:  Clear to auscultation bilaterally BACK:  No CVA tenderness CHEST:  Unremarkable HEART:  S1 and S2 within normal limits, no S3, no S4, no clicks, no rubs, no murmurs ABD:  Positive bowel sounds normal in frequency in pitch, no bruits, no rebound, no  guarding, unable to assess midline mass or bruit with the patient seated. EXT:  2 plus pulses throughout,  left calf nonpitting edema, no cyanosis no clubbing SKIN:  No rashes no nodules NEURO: Facial droop with left weakness    EKG:  Atrial fibrillation, rate 82, axis within normal limits, poor and her R wave progression, no acute ST-T wave changes.  Premature ectopic complex  01/10/2013  ASSESSMENT AND PLAN  Atrial fibrillation:  For now given her hemorrhagic stroke I am going to continue to avoid systemic anticoagulation. She had reasonable rate control in the hospital. Her pressures seem to be tolerating the medications as listed. I have reviewed her medications and I will make no change.  I did review all available hospital records.  DVT: I agree that she needs anticoagulation and Xarelto is a good choice.  She is on a dose adjusted for age and fraility.    FREQUENT URINATION:  I spoke with Dr. Felipa Eth.  She was given Greta Doom  She is to call after the weekend if this is not working and she will be referred for urology visit.

## 2013-01-10 NOTE — Patient Instructions (Addendum)
The current medical regimen is effective;  continue present plan and medications.  Follow up in 3 months with Dr Hochrein. 

## 2013-02-05 ENCOUNTER — Emergency Department (HOSPITAL_COMMUNITY): Payer: Medicare Other

## 2013-02-05 ENCOUNTER — Emergency Department (HOSPITAL_COMMUNITY)
Admission: EM | Admit: 2013-02-05 | Discharge: 2013-02-05 | Disposition: A | Discharge status: Home / Self Care | Payer: Medicare Other | Attending: Emergency Medicine | Admitting: Emergency Medicine

## 2013-02-05 ENCOUNTER — Encounter (HOSPITAL_COMMUNITY): Payer: Self-pay | Admitting: *Deleted

## 2013-02-05 DIAGNOSIS — R059 Cough, unspecified: Secondary | ICD-10-CM | POA: Insufficient documentation

## 2013-02-05 DIAGNOSIS — F3289 Other specified depressive episodes: Secondary | ICD-10-CM | POA: Insufficient documentation

## 2013-02-05 DIAGNOSIS — I69921 Dysphasia following unspecified cerebrovascular disease: Secondary | ICD-10-CM | POA: Insufficient documentation

## 2013-02-05 DIAGNOSIS — I10 Essential (primary) hypertension: Secondary | ICD-10-CM | POA: Insufficient documentation

## 2013-02-05 DIAGNOSIS — I4891 Unspecified atrial fibrillation: Secondary | ICD-10-CM | POA: Insufficient documentation

## 2013-02-05 DIAGNOSIS — Z87891 Personal history of nicotine dependence: Secondary | ICD-10-CM | POA: Insufficient documentation

## 2013-02-05 DIAGNOSIS — R05 Cough: Secondary | ICD-10-CM | POA: Insufficient documentation

## 2013-02-05 DIAGNOSIS — Z79899 Other long term (current) drug therapy: Secondary | ICD-10-CM | POA: Insufficient documentation

## 2013-02-05 DIAGNOSIS — M129 Arthropathy, unspecified: Secondary | ICD-10-CM | POA: Insufficient documentation

## 2013-02-05 DIAGNOSIS — R531 Weakness: Secondary | ICD-10-CM

## 2013-02-05 DIAGNOSIS — E039 Hypothyroidism, unspecified: Secondary | ICD-10-CM | POA: Insufficient documentation

## 2013-02-05 DIAGNOSIS — Z8701 Personal history of pneumonia (recurrent): Secondary | ICD-10-CM | POA: Insufficient documentation

## 2013-02-05 DIAGNOSIS — F329 Major depressive disorder, single episode, unspecified: Secondary | ICD-10-CM | POA: Insufficient documentation

## 2013-02-05 DIAGNOSIS — E785 Hyperlipidemia, unspecified: Secondary | ICD-10-CM | POA: Insufficient documentation

## 2013-02-05 DIAGNOSIS — M6281 Muscle weakness (generalized): Secondary | ICD-10-CM | POA: Insufficient documentation

## 2013-02-05 DIAGNOSIS — R569 Unspecified convulsions: Secondary | ICD-10-CM | POA: Insufficient documentation

## 2013-02-05 DIAGNOSIS — Z86718 Personal history of other venous thrombosis and embolism: Secondary | ICD-10-CM | POA: Insufficient documentation

## 2013-02-05 LAB — COMPREHENSIVE METABOLIC PANEL
ALT: 16 U/L (ref 0–35)
Alkaline Phosphatase: 59 U/L (ref 39–117)
CO2: 25 mEq/L (ref 19–32)
GFR calc Af Amer: >90 mL/min (ref >90)
Glucose, Bld: 97 mg/dL (ref 70–99)
Potassium: 3.8 mEq/L (ref 3.5–5.1)
Sodium: 139 mEq/L (ref 135–145)
Total Protein: 6.7 g/dL (ref 6.0–8.3)

## 2013-02-05 LAB — CBC
Hemoglobin: 13.1 g/dL (ref 12.0–15.0)
MCHC: 33.7 g/dL (ref 30.0–36.0)
RBC: 4.17 MIL/uL (ref 3.87–5.11)
WBC: 5.2 10*3/uL (ref 4.0–10.5)

## 2013-02-05 LAB — URINALYSIS, ROUTINE W REFLEX MICROSCOPIC
Bilirubin Urine: NEGATIVE
Hgb urine dipstick: NEGATIVE
Ketones, ur: NEGATIVE mg/dL
Specific Gravity, Urine: 1.013 (ref 1.005–1.030)
Urobilinogen, UA: 0.2 mg/dL (ref 0.0–1.0)
pH: 7.5 (ref 5.0–8.0)

## 2013-02-05 NOTE — ED Notes (Signed)
[facility] contacted for transport 

## 2013-02-05 NOTE — ED Notes (Signed)
Pt from an independent/assisted living facility.  States that she was hard to wake up this morning.  Pt has hx of CVA, (L) sided facial droop. CBG 104.  Pt appears lethargic and weak.  Vitals stable.  NSR 102, 118 systolic

## 2013-02-05 NOTE — ED Provider Notes (Signed)
History     CSN: [id number]  Arrival date & time 02/05/13  1055   First MD Initiated Contact with Patient 02/05/13 1102      Chief Complaint  Patient presents with  . Weakness    (Consider location/radiation/quality/duration/timing/severity/associated sxs/prior treatment) Patient is a 79 y.o. female presenting with weakness. The history is provided by the patient.  Weakness Pertinent negatives include no chest pain, no abdominal pain, no headaches and no shortness of breath.  pt w hx afib, cva, deconditioning, presents from assisted living today where staff felt patient appeared generally weak, not as active as baseline. Pt states she feels fine, pt very difficult historian. Pt does acknowledge recent non productive cough. Spouse notes recent poor po intake. No vomiting or diarrhea. No abd pain. No dysuria or gu c/o. No fever or chills. No cp or sob. No headaches. Denies trauma or fall. Is on xarelto, denies any abn bruising or bleeding. Denies recent medication change.     Past Medical History  Diagnosis Date  . Dysphagia   . Atrial fibrillation     a. Dx 05/2011 at time of CVA.  Marland Kitchen Hyperlipidemia   . HTN (hypertension)   . Hypothyroidism   . Pneumonia     "alot of pneumonia" (09/21/2012)  . Syncope and collapse     "fell @ [facility]; hit left forehead"; left hip fx/notes 09/20/2012  . Vertigo   . Arthritis     "some; all over" (09/22/2011)  . Anxiety   . Depression   . Seizures      a. 05/2011 right MCA CVA in setting of new onset afib s/p thrombolysis/thrombolytics. b. Recurrent CVA 10/2011 in setting of subtherap.INR.  "only one she ever had was 3 months after her stroke" (09/21/2012  . Seizure 10/2011  . Stroke 05/2011    a. 05/2011 right MCA CVA in setting of new onset afib s/p thrombolysis/thrombolytics. b. Recurrent CVA 10/2011 in setting of subtherap.INR.  . DVT of lower extremity (deep venous thrombosis) 01/02/2013    LLE    Past Surgical History  Procedure Laterality Date   . Lumbar disc surgery  1981  . Vaginal hysterectomy  1982  . Pleural scarification  1977?; 1986    "2 lung surgeries; my lungs had collapsed" (09/21/2012)  . Tonsillectomy and adenoidectomy  1950?  Marland Kitchen Appendectomy  1982  . Thrombolysis  05/2011    [name] 09/20/2012  . Hip arthroplasty  09/21/2012    Procedure: ARTHROPLASTY BIPOLAR HIP;  Surgeon: Kerrin Champagne, MD;  [facility];  Service: Orthopedics;  Laterality: Left;  . Joint replacement      Family History  Problem Relation Age of Onset  . CAD Neg Hx     History  Substance Use Topics  . Smoking status: Former Smoker -- 0.50 packs/day for 30 years    Types: Cigarettes    Quit date: 11/16/1979  . Smokeless tobacco: Never Used  . Alcohol Use: 0.0 oz/week     Comment: 01/02/2013 "1-2 glasss of wine/wk when I drink; haven't had any for a long time"    OB History   Grav Para Term Preterm Abortions TAB SAB Ect Mult Living                  Review of Systems  Constitutional: Negative for fever and chills.  HENT: Negative for neck pain.   Eyes: Negative for pain and visual disturbance.  Respiratory: Positive for cough. Negative for shortness of breath.   Cardiovascular: Negative  for chest pain.  Gastrointestinal: Negative for vomiting, abdominal pain and diarrhea.  Genitourinary: Negative for dysuria and flank pain.  Musculoskeletal: Negative for back pain.  Skin: Negative for rash.  Neurological: Positive for weakness. Negative for numbness and headaches.  Hematological: Does not bruise/bleed easily.  Psychiatric/Behavioral: Negative for confusion.    Allergies  Review of patient's allergies indicates no known allergies.  Home Medications   Current Outpatient Rx  Name  Route  Sig  Dispense  Refill  . acetaminophen (TYLENOL) 325 MG tablet   Oral   Take 650 mg by mouth every 6 (six) hours as needed for pain.         Marland Kitchen ALPRAZolam (XANAX) 0.5 MG tablet   Oral   Take 0.5 mg by mouth at bedtime as needed for sleep or  anxiety.         Marland Kitchen atorvastatin (LIPITOR) 10 MG tablet   Oral   Take 10 mg by mouth daily at 6 PM.         . barrier cream (NON-SPECIFIED) CREA   Topical   Apply 1 application topically 2 (two) times daily as needed (to bed sore).         . ciprofloxacin (CIPRO) 500 MG tablet   Oral   Take 500 mg by mouth 2 (two) times daily.         Marland Kitchen diltiazem (CARDIZEM CD) 120 MG 24 hr capsule   Oral   Take 1 capsule (120 mg total) by mouth daily.   30 capsule   1   . docusate sodium (COLACE) 100 MG capsule   Oral   Take 100 mg by mouth 2 (two) times daily as needed for constipation.         . feeding supplement (ENSURE COMPLETE) LIQD   Oral   Take 237 mLs by mouth 2 (two) times daily between meals.         Marland Kitchen HYDROcodone-acetaminophen (NORCO/VICODIN) 5-325 MG per tablet   Oral   Take 1 tablet by mouth every 6 (six) hours as needed for pain.         Marland Kitchen levETIRAcetam (KEPPRA) 500 MG tablet   Oral   Take 500 mg by mouth 2 (two) times daily.         Marland Kitchen levothyroxine (SYNTHROID, LEVOTHROID) 50 MCG tablet   Oral   Take 50 mcg by mouth every morning.          . Multiple Vitamin (MULITIVITAMIN WITH MINERALS) TABS   Oral   Take 1 tablet by mouth daily at 12 noon.         . polyethylene glycol (MIRALAX / GLYCOLAX) packet   Oral   Take 17 g by mouth daily as needed (constipation).          . Rivaroxaban (XARELTO) 15 MG TABS tablet   Oral   Take 1 tablet (15 mg total) by mouth 2 (two) times daily.   42 tablet   0     There were no vitals taken for this visit.  Physical Exam  Nursing note and vitals reviewed. Constitutional: She is oriented to person, place, and time. No distress.  Very thin/frail appearing.  HENT:  Head: Atraumatic.  Mouth/Throat: Oropharynx is clear and moist.  Eyes: Conjunctivae are normal. Pupils are equal, round, and reactive to light. No scleral icterus.  Neck: Normal range of motion. Neck supple. No tracheal deviation present. No  thyromegaly present.  No bruit  Cardiovascular: Normal rate, normal heart sounds  and intact distal pulses.   Pulmonary/Chest: Effort normal and breath sounds normal. No respiratory distress.  Abdominal: Soft. Normal appearance and bowel sounds are normal. She exhibits no distension and no mass. There is no tenderness. There is no rebound and no guarding.  Genitourinary:  No cva tenderness  Musculoskeletal: She exhibits no edema.  Neurological: She is alert and oriented to person, place, and time.  Mild left facial droop and left weakness, 4+/5, baseline per pt.   Skin: Skin is warm and dry. No rash noted. She is not diaphoretic.  Psychiatric:  Flat affect ?mildly depressed mood    ED Course  Procedures (including critical care time)   Results for orders placed during the hospital encounter of 02/05/13  URINALYSIS, ROUTINE W REFLEX MICROSCOPIC      Result Value Range   Color, Urine YELLOW  YELLOW   APPearance CLOUDY (*) CLEAR   Specific Gravity, Urine 1.013  1.005 - 1.030   pH 7.5  5.0 - 8.0   Glucose, UA NEGATIVE  NEGATIVE mg/dL   Hgb urine dipstick NEGATIVE  NEGATIVE   Bilirubin Urine NEGATIVE  NEGATIVE   Ketones, ur NEGATIVE  NEGATIVE mg/dL   Protein, ur NEGATIVE  NEGATIVE mg/dL   Urobilinogen, UA 0.2  0.0 - 1.0 mg/dL   Nitrite NEGATIVE  NEGATIVE   Leukocytes, UA NEGATIVE  NEGATIVE  CBC      Result Value Range   WBC 5.2  4.0 - 10.5 K/uL   RBC 4.17  3.87 - 5.11 MIL/uL   Hemoglobin 13.1  12.0 - 15.0 g/dL   HCT 84.6  96.2 - 95.2 %   MCV 93.3  78.0 - 100.0 fL   MCH 31.4  26.0 - 34.0 pg   MCHC 33.7  30.0 - 36.0 g/dL   RDW 84.1  32.4 - 40.1 %   Platelets 200  150 - 400 K/uL  COMPREHENSIVE METABOLIC PANEL      Result Value Range   Sodium 139  135 - 145 mEq/L   Potassium 3.8  3.5 - 5.1 mEq/L   Chloride 102  96 - 112 mEq/L   CO2 25  19 - 32 mEq/L   Glucose, Bld 97  70 - 99 mg/dL   BUN 17  6 - 23 mg/dL   Creatinine, Ser 0.27  0.50 - 1.10 mg/dL   Calcium 9.7  8.4 - 25.3  mg/dL   Total Protein 6.7  6.0 - 8.3 g/dL   Albumin 3.1 (*) 3.5 - 5.2 g/dL   AST 23  0 - 37 U/L   ALT 16  0 - 35 U/L   Alkaline Phosphatase 59  39 - 117 U/L   Total Bilirubin 0.3  0.3 - 1.2 mg/dL   GFR calc non Af Amer 80 (*) >90 mL/min   GFR calc Af Amer >90  >90 mL/min  TROPONIN I      Result Value Range   Troponin I <0.30  <0.30 ng/mL   Dg Chest 2 View  02/05/2013   *RADIOLOGY REPORT*  Clinical Data: Cough.  CHEST - 2 VIEW  Comparison: Chest x-ray 09/20/2012.  Findings: Lung volumes are normal.  No consolidative airspace disease.  No pleural effusions.  No pneumothorax.  No pulmonary nodule or mass noted.  Pulmonary vasculature and the cardiomediastinal silhouette are within normal limits. Atherosclerosis in the thoracic aorta.  Surgical clips are present in the right apex likely from prior wedge resection.  Right apical pleuroparenchymal thickening is unchanged compared to prior examinations  and is presumably chronic pleural scarring.  IMPRESSION: 1. No radiographic evidence of acute cardiopulmonary disease. 2.  Atherosclerosis.   Original Report Authenticated By: Trudie Reed, M.D.   Ct Head Wo Contrast  02/05/2013   *RADIOLOGY REPORT*  Clinical Data: Altered mental status.  CT HEAD WITHOUT CONTRAST  Technique:  Contiguous axial images were obtained from the base of the skull through the vertex without contrast.  Comparison: Head CT 09/25/2012.  Brain MRI 01/02/2013.  Findings: Large area of low attenuation throughout the right MCA distribution, compatible with encephalomalacia related to prior right MCA territory infarction.  Mild cerebral and cerebellar atrophy.  Patchy and confluent areas of decreased attenuation throughout the deep and periventricular white matter of the cerebral hemispheres bilaterally, compatible with chronic microvascular ischemic disease.  No definite signs to suggest acute infarction, acute intracerebral hemorrhage, mass, mass effect or hydrocephalous.  Visualized  paranasal sinuses and mastoids are well pneumatized.  No acute displaced skull fractures.  IMPRESSION: 1.  No acute intracranial abnormalities. 2.  The appearance of brain is essentially unchanged, including cerebral atrophy, chronic white matter ischemic disease, and old right MCA territory infarction.   Original Report Authenticated By: Trudie Reed, M.D.      MDM  Iv ns. Labs.   Reviewed nursing notes and prior charts for additional history.    Date: 02/05/2013  Rate: 92  Rhythm: atrial fibrillation  QRS Axis: right  Intervals: afib  ST/T Wave abnormalities: nonspecific ST/T changes  Conduction Disutrbances:afib  Narrative Interpretation:   Old EKG Reviewed: changes noted  Recheck pt alert, content, answers questions appropriately.  Pt continues to deny any c/o. Pt and spouse indicate feel ready for discharge to home.  Home care staff also here w pt, can assist.           Suzi Roots, MD 02/05/13 1400

## 2013-02-05 NOTE — ED Notes (Signed)
[facility] arrived to pick patient up. No belongings left in room. Pt husband and caregiver left by car.

## 2013-02-05 NOTE — ED Notes (Signed)
Unable to draw troponin off IV site. Phlebotomy made aware.

## 2013-02-12 ENCOUNTER — Encounter: Payer: Self-pay | Admitting: Cardiology

## 2013-04-12 ENCOUNTER — Ambulatory Visit: Payer: Medicare Other | Admitting: Cardiology

## 2013-05-10 ENCOUNTER — Encounter: Payer: Self-pay | Admitting: Cardiology

## 2013-05-10 ENCOUNTER — Ambulatory Visit (INDEPENDENT_AMBULATORY_CARE_PROVIDER_SITE_OTHER): Payer: Medicare Other | Admitting: Cardiology

## 2013-05-10 VITALS — BP 117/73 | HR 104 | Ht 68.0 in | Wt 96.8 lb

## 2013-05-10 DIAGNOSIS — R55 Syncope and collapse: Secondary | ICD-10-CM

## 2013-05-10 NOTE — Progress Notes (Signed)
HPI The patient presents for followup of atrial fib and a DVT. Since her last saw her she has done relatively well and has tolerated her anticoagulation. She and her husband are living in a nursing home. She does ambulate with a walker every day. She has had no acute complaints altough I see that she was in the emergency room several weeks ago with weakness.  She's not noticing any palpitations and has had no presyncope or syncope. She's not having new shortness of breath, PND or orthopnea. There has been no chest pain. She does have weakness. Her biggest complaint still continues to be frequent urination.  No Known Allergies  Current Outpatient Prescriptions  Medication Sig Dispense Refill  . ALPRAZolam (XANAX) 0.5 MG tablet Take 0.5 mg by mouth at bedtime as needed for sleep or anxiety.      Marland Kitchen atorvastatin (LIPITOR) 10 MG tablet Take 10 mg by mouth daily at 6 PM.      . Calcium Carbonate-Vitamin D (CALTRATE 600+D) 600-400 MG-UNIT per tablet Take 1 tablet by mouth daily.      Marland Kitchen diltiazem (CARDIZEM CD) 120 MG 24 hr capsule Take 1 capsule (120 mg total) by mouth daily.  30 capsule  1  . docusate sodium (COLACE) 100 MG capsule Take 100 mg by mouth daily.      Marland Kitchen escitalopram (LEXAPRO) 5 MG tablet Take 5 mg by mouth daily.      Marland Kitchen HYDROcodone-acetaminophen (NORCO/VICODIN) 5-325 MG per tablet Take 1 tablet by mouth every 6 (six) hours as needed for pain.      Marland Kitchen levETIRAcetam (KEPPRA) 500 MG tablet Take 500 mg by mouth 2 (two) times daily.      Marland Kitchen levothyroxine (SYNTHROID, LEVOTHROID) 50 MCG tablet Take 50 mcg by mouth every morning.       . mirabegron ER (MYRBETRIQ) 25 MG TB24 tablet Take 25 mg by mouth daily.      . Rivaroxaban (XARELTO) 15 MG TABS tablet Take 1 tablet (15 mg total) by mouth 2 (two) times daily.  42 tablet  0   No current facility-administered medications for this visit.    Past Medical History  Diagnosis Date  . Dysphagia   . Atrial fibrillation     a. Dx 05/2011 at time of  CVA.  Marland Kitchen Hyperlipidemia   . HTN (hypertension)   . Hypothyroidism   . Pneumonia     "alot of pneumonia" (09/21/2012)  . Syncope and collapse     "fell @ [facility]; hit left forehead"; left hip fx/notes 09/20/2012  . Vertigo   . Arthritis     "some; all over" (09/22/2011)  . Anxiety   . Depression   . Seizures      a. 05/2011 right MCA CVA in setting of new onset afib s/p thrombolysis/thrombolytics. b. Recurrent CVA 10/2011 in setting of subtherap.INR.  "only one she ever had was 3 months after her stroke" (09/21/2012  . Seizure 10/2011  . Stroke 05/2011    a. 05/2011 right MCA CVA in setting of new onset afib s/p thrombolysis/thrombolytics. b. Recurrent CVA 10/2011 in setting of subtherap.INR.  . DVT of lower extremity (deep venous thrombosis) 01/02/2013    LLE    ROS:   As stated in the HPI and negative for all other systems.  PHYSICAL EXAM BP 117/73  Pulse 104  Ht 5\' 8"  (1.727 m)  Wt 96 lb 12.8 oz (43.908 kg)  BMI 14.72 kg/m2 PHYSICAL EXAM GEN:  No distress, very frail NECK:  No jugular venous distention at 90 degrees, waveform within normal limits, carotid upstroke brisk and symmetric, no bruits, no thyromegaly LYMPHATICS:  No cervical adenopathy LUNGS:  Clear to auscultation bilaterally BACK:  No CVA tenderness CHEST:  Unremarkable HEART:  S1 and S2 within normal limits, no S3, , no clicks, no rubs, no murmurs, irregular ABD:  Positive bowel sounds normal in frequency in pitch, no bruits, no rebound, no guarding, unable to assess midline mass or bruit with the patient seated. EXT:  2 plus pulses throughout, no cyanosis no clubbing SKIN:  No rashes no nodules, multiple bruises and NEURO: Facial droop improved with left weakness    EKG:  Atrial fibrillation, rate 100, axis within normal limits, poor and her R wave progression, no acute ST-T wave changes.  05/10/2013  ASSESSMENT AND PLAN  Atrial fibrillation:  The patient  tolerates this rhythm and rate control and anticoagulation.  We will continue with the meds as listed.  DVT: She continues anticoagulation as above.  FREQUENT URINATION:  This is still her biggest complain that she will followup with [name], MD

## 2013-05-10 NOTE — Patient Instructions (Addendum)
The current medical regimen is effective;  continue present plan and medications.  Follow up in 6 months with Dr Hochrein.  You will receive a letter in the mail 2 months before you are due.  Please call us when you receive this letter to schedule your follow up appointment.  

## 2013-05-31 ENCOUNTER — Emergency Department (HOSPITAL_COMMUNITY)
Admission: EM | Admit: 2013-05-31 | Discharge: 2013-05-31 | Disposition: A | Discharge status: Home / Self Care | Payer: Medicare Other | Attending: Emergency Medicine | Admitting: Emergency Medicine

## 2013-05-31 ENCOUNTER — Encounter (HOSPITAL_COMMUNITY): Payer: Self-pay | Admitting: Emergency Medicine

## 2013-05-31 ENCOUNTER — Emergency Department (HOSPITAL_COMMUNITY): Payer: Medicare Other

## 2013-05-31 DIAGNOSIS — I6350 Cerebral infarction due to unspecified occlusion or stenosis of unspecified cerebral artery: Secondary | ICD-10-CM

## 2013-05-31 DIAGNOSIS — Y9289 Other specified places as the place of occurrence of the external cause: Secondary | ICD-10-CM | POA: Insufficient documentation

## 2013-05-31 DIAGNOSIS — Z8701 Personal history of pneumonia (recurrent): Secondary | ICD-10-CM | POA: Insufficient documentation

## 2013-05-31 DIAGNOSIS — Z86718 Personal history of other venous thrombosis and embolism: Secondary | ICD-10-CM | POA: Insufficient documentation

## 2013-05-31 DIAGNOSIS — Z87891 Personal history of nicotine dependence: Secondary | ICD-10-CM | POA: Insufficient documentation

## 2013-05-31 DIAGNOSIS — I10 Essential (primary) hypertension: Secondary | ICD-10-CM | POA: Insufficient documentation

## 2013-05-31 DIAGNOSIS — W19XXXA Unspecified fall, initial encounter: Secondary | ICD-10-CM

## 2013-05-31 DIAGNOSIS — E785 Hyperlipidemia, unspecified: Secondary | ICD-10-CM | POA: Insufficient documentation

## 2013-05-31 DIAGNOSIS — F411 Generalized anxiety disorder: Secondary | ICD-10-CM | POA: Insufficient documentation

## 2013-05-31 DIAGNOSIS — S0003XA Contusion of scalp, initial encounter: Secondary | ICD-10-CM | POA: Insufficient documentation

## 2013-05-31 DIAGNOSIS — M129 Arthropathy, unspecified: Secondary | ICD-10-CM | POA: Insufficient documentation

## 2013-05-31 DIAGNOSIS — I4891 Unspecified atrial fibrillation: Secondary | ICD-10-CM | POA: Insufficient documentation

## 2013-05-31 DIAGNOSIS — R29810 Facial weakness: Secondary | ICD-10-CM | POA: Insufficient documentation

## 2013-05-31 DIAGNOSIS — F3289 Other specified depressive episodes: Secondary | ICD-10-CM | POA: Insufficient documentation

## 2013-05-31 DIAGNOSIS — Z8673 Personal history of transient ischemic attack (TIA), and cerebral infarction without residual deficits: Secondary | ICD-10-CM | POA: Insufficient documentation

## 2013-05-31 DIAGNOSIS — Z79899 Other long term (current) drug therapy: Secondary | ICD-10-CM | POA: Insufficient documentation

## 2013-05-31 DIAGNOSIS — E039 Hypothyroidism, unspecified: Secondary | ICD-10-CM | POA: Insufficient documentation

## 2013-05-31 DIAGNOSIS — Y9389 Activity, other specified: Secondary | ICD-10-CM | POA: Insufficient documentation

## 2013-05-31 DIAGNOSIS — F329 Major depressive disorder, single episode, unspecified: Secondary | ICD-10-CM | POA: Insufficient documentation

## 2013-05-31 DIAGNOSIS — G40909 Epilepsy, unspecified, not intractable, without status epilepticus: Secondary | ICD-10-CM | POA: Insufficient documentation

## 2013-05-31 DIAGNOSIS — W1809XA Striking against other object with subsequent fall, initial encounter: Secondary | ICD-10-CM | POA: Insufficient documentation

## 2013-05-31 LAB — POCT I-STAT, CHEM 8
BUN: 12 mg/dL (ref 6–23)
Calcium, Ion: 1.27 mmol/L (ref 1.13–1.30)
Creatinine, Ser: 1.1 mg/dL (ref 0.50–1.10)
Sodium: 141 mEq/L (ref 135–145)
TCO2: 27 mmol/L (ref 0–100)

## 2013-05-31 LAB — CBC
Hemoglobin: 13.8 g/dL (ref 12.0–15.0)
MCHC: 34.5 g/dL (ref 30.0–36.0)
RDW: 13.9 % (ref 11.5–15.5)

## 2013-05-31 LAB — POCT I-STAT TROPONIN I: Troponin i, poc: 0 ng/mL (ref 0.00–0.08)

## 2013-05-31 LAB — GLUCOSE, CAPILLARY: Glucose-Capillary: 86 mg/dL (ref 70–99)

## 2013-05-31 LAB — DIFFERENTIAL
Basophils Absolute: 0 10*3/uL (ref 0.0–0.1)
Basophils Relative: 0 % (ref 0–1)
Neutro Abs: 5.1 10*3/uL (ref 1.7–7.7)
Neutrophils Relative %: 67 % (ref 43–77)

## 2013-05-31 LAB — CG4 I-STAT (LACTIC ACID): Lactic Acid, Venous: 1.25 mmol/L (ref 0.5–2.2)

## 2013-05-31 LAB — COMPREHENSIVE METABOLIC PANEL
AST: 28 U/L (ref 0–37)
Albumin: 3.6 g/dL (ref 3.5–5.2)
Alkaline Phosphatase: 65 U/L (ref 39–117)
Chloride: 101 mEq/L (ref 96–112)
Potassium: 3.6 mEq/L (ref 3.5–5.1)
Total Bilirubin: 0.5 mg/dL (ref 0.3–1.2)

## 2013-05-31 LAB — PROTIME-INR: INR: 1.84 — ABNORMAL HIGH (ref 0.00–1.49)

## 2013-05-31 MED ORDER — SODIUM CHLORIDE 0.9 % IV BOLUS (SEPSIS)
500.0000 mL | Freq: Once | INTRAVENOUS | Status: AC
  Administered 2013-05-31: 500 mL via INTRAVENOUS

## 2013-05-31 NOTE — ED Provider Notes (Signed)
CSN: [id number]     Arrival date & time 05/31/13  1628 History   First MD Initiated Contact with Patient 05/31/13 1644     Chief Complaint  Patient presents with  . Code Stroke   (Consider location/radiation/quality/duration/timing/severity/associated sxs/prior Treatment) HPI Comments: Level 5 caveat due to altered mentation.  Pt with h/o prior CVA in 2012, received TPA then.  EMS was called due to the fall.  Pt with weakness on the left side, facial droop on left side.  Head injury with scalp laceration on posterior left side.  No N/V.  Pt is reportedly on xarelto, used to presumably be on coumadin in the past for atrial fibrillation.    Patient is a 79 y.o. female presenting with fall. The history is provided by medical records, the EMS personnel and the patient. The history is limited by the condition of the patient.  Fall This is a new problem. The current episode started less than 1 hour ago. The treatment provided no relief.    Past Medical History  Diagnosis Date  . Dysphagia   . Atrial fibrillation     a. Dx 05/2011 at time of CVA.  Marland Kitchen Hyperlipidemia   . HTN (hypertension)   . Hypothyroidism   . Pneumonia     "alot of pneumonia" (09/21/2012)  . Syncope and collapse     "fell @ [facility]; hit left forehead"; left hip fx/notes 09/20/2012  . Vertigo   . Arthritis     "some; all over" (09/22/2011)  . Anxiety   . Depression   . Seizures      a. 05/2011 right MCA CVA in setting of new onset afib s/p thrombolysis/thrombolytics. b. Recurrent CVA 10/2011 in setting of subtherap.INR.  "only one she ever had was 3 months after her stroke" (09/21/2012  . Seizure 10/2011  . Stroke 05/2011    a. 05/2011 right MCA CVA in setting of new onset afib s/p thrombolysis/thrombolytics. b. Recurrent CVA 10/2011 in setting of subtherap.INR.  . DVT of lower extremity (deep venous thrombosis) 01/02/2013    LLE   Past Surgical History  Procedure Laterality Date  . Lumbar disc surgery  1981  . Vaginal  hysterectomy  1982  . Pleural scarification  1977?; 1986    "2 lung surgeries; my lungs had collapsed" (09/21/2012)  . Tonsillectomy and adenoidectomy  1950?  Marland Kitchen Appendectomy  1982  . Thrombolysis  05/2011    [name] 09/20/2012  . Hip arthroplasty  09/21/2012    Procedure: ARTHROPLASTY BIPOLAR HIP;  Surgeon: Kerrin Champagne, MD;  Location: [facility] OR;  Service: Orthopedics;  Laterality: Left;  . Joint replacement     Family History  Problem Relation Age of Onset  . CAD Neg Hx    History  Substance Use Topics  . Smoking status: Former Smoker -- 0.50 packs/day for 30 years    Types: Cigarettes    Quit date: 11/16/1979  . Smokeless tobacco: Never Used  . Alcohol Use: 0.0 oz/week     Comment: 01/02/2013 "1-2 glasss of wine/wk when I drink; haven't had any for a long time"   OB History   Grav Para Term Preterm Abortions TAB SAB Ect Mult Living                 Review of Systems  Unable to perform ROS: Acuity of condition    Allergies  Review of patient's allergies indicates no known allergies.  Home Medications   Current Outpatient Rx  Name  Route  Sig  Dispense  Refill  . ALPRAZolam (XANAX) 0.5 MG tablet   Oral   Take 0.5 mg by mouth 2 (two) times daily.          Marland Kitchen atorvastatin (LIPITOR) 10 MG tablet   Oral   Take 10 mg by mouth daily.          . Calcium Carbonate-Vitamin D (CALTRATE 600+D) 600-400 MG-UNIT per tablet   Oral   Take 1 tablet by mouth at bedtime.          . digoxin (LANOXIN) 0.125 MG tablet   Oral   Take 0.125 mg by mouth daily.         Marland Kitchen diltiazem (CARDIZEM SR) 60 MG 12 hr capsule   Oral   Take 60 mg by mouth daily.         Marland Kitchen docusate sodium (COLACE) 100 MG capsule   Oral   Take 100 mg by mouth at bedtime.          Marland Kitchen escitalopram (LEXAPRO) 5 MG tablet   Oral   Take 5 mg by mouth daily.         Marland Kitchen levETIRAcetam (KEPPRA) 500 MG tablet   Oral   Take 500 mg by mouth 2 (two) times daily.         Marland Kitchen levothyroxine (SYNTHROID, LEVOTHROID) 50 MCG  tablet   Oral   Take 50 mcg by mouth daily.          . mirabegron ER (MYRBETRIQ) 25 MG TB24 tablet   Oral   Take 25 mg by mouth daily.         . Multiple Vitamin (MULTIVITAMIN WITH MINERALS) TABS tablet   Oral   Take 1 tablet by mouth daily.         Marland Kitchen OVER THE COUNTER MEDICATION   Topical   Apply 1 application topically daily. Cream for sores on tailbone         . polyethylene glycol (MIRALAX / GLYCOLAX) packet   Oral   Take 17 g by mouth daily.         . Rivaroxaban (XARELTO) 15 MG TABS tablet   Oral   Take 1 tablet (15 mg total) by mouth 2 (two) times daily.   42 tablet   0    BP 136/61  Pulse 86  Temp(Src) 98.7 F (37.1 C) (Oral)  Resp 16  SpO2 100% Physical Exam  Nursing note and vitals reviewed. Constitutional: She appears well-developed and well-nourished. No distress.  HENT:  Head: Normocephalic.    Eyes: EOM are normal.  Cardiovascular: Normal rate and intact distal pulses.  An irregularly irregular rhythm present.  Pulmonary/Chest: Effort normal. No respiratory distress.  Abdominal: Soft. She exhibits no distension. There is no tenderness.  Neurological: She is alert. She displays no tremor. She exhibits normal muscle tone. Coordination abnormal. GCS eye subscore is 4. GCS verbal subscore is 5. GCS motor subscore is 6.  Skin: Skin is warm. She is not diaphoretic.    ED Course  Procedures (including critical care time) Labs Review Labs Reviewed  PROTIME-INR - Abnormal; Notable for the following:    Prothrombin Time 20.7 (*)    INR 1.84 (*)    All other components within normal limits  COMPREHENSIVE METABOLIC PANEL - Abnormal; Notable for the following:    Glucose, Bld 102 (*)    GFR calc non Af Amer 65 (*)    GFR calc Af Amer 75 (*)    All other components within  normal limits  POCT I-STAT, CHEM 8 - Abnormal; Notable for the following:    Glucose, Bld 104 (*)    All other components within normal limits  ETHANOL  APTT  CBC    DIFFERENTIAL  TROPONIN I  GLUCOSE, CAPILLARY  URINE RAPID DRUG SCREEN (HOSP PERFORMED)  URINALYSIS, ROUTINE W REFLEX MICROSCOPIC  CG4 I-STAT (LACTIC ACID)  POCT I-STAT TROPONIN I   Imaging Review Ct Head Wo Contrast  05/31/2013   **ADDENDUM** CREATED: 05/31/2013 16:57:44  Results discussed with Dr. Leroy Kennedy 05/31/2013 4:50 p.m.  **END ADDENDUM** SIGNED BY: Almedia Balls. Constance Goltz, M.D.  05/31/2013   *RADIOLOGY REPORT*  Clinical Data: Left-sided weakness.  CT HEAD WITHOUT CONTRAST  Technique:  Contiguous axial images were obtained from the base of the skull through the vertex without contrast.  Comparison: 02/05/2013.  Findings: Series 2 image 20 raises possibility of small acute left frontal lobe infarct.  It is possible this represents partial volume averaging with a sulcus although different than on the prior exam therefore infarct is of concern.  No intracranial hemorrhage.  Remote large right hemispheric infarct with encephalomalacia right temporal lobe, opercular region and portion of the right parietal lobe.  Subsequent mild dilation of the right lateral ventricle.  No hydrocephalus.  Within the right pons (series 9 image 32), hypodensity is noted. This may represent artifact although limited for detecting small acute infarct.  No intracranial mass lesion detected on this unenhanced exam.  Mastoid air cells, middle ear cavities and visualized sinuses are clear.  Orbital structures unremarkable.  IMPRESSION: Question small acute non hemorrhagic infarct left frontal lobe.  Remote large right hemispheric infarct.  Probable streak artifact right pons limiting evaluation for detection of small acute infarct at this level.  Global atrophy without hydrocephalus.  Changes in to Dr. Leroy Kennedy as this is a code stroke.  Original Report Authenticated By: Lacy Duverney, M.D.    ECG at time 16:45 shows atrial fibrillation at rate 93, low QRS voltages, borderline RAD.  Abn ECG.  No sig change from ECG on  05/13/13.  Saturation is 100% and I interpret to be adequate    6:09 PM Pt remains alert, protecting airway.  MRI ordered.  Wound cleaned, hematoma and contusion present, no further active bleeding, no large laceration of scalp that need closure.   8:00PM MRI is pending.  CT of cervical spine is negative for acute fracture or sig injury.  If MRI brain is negative for CVA, likely can be sent back to facility.  If + for acute CVA, will need hospitalist admission.  Neuro hospitalsit notes reviewed.    MDM  Impression: Fall Scalp hematoma Weakness     Pt called as code CVA by EMS.  Pt had head CT adn seen by neurology team at arrival .  Given ehad injury from fall and on either coumadin or xarelto, not a TPA candidate.  Symptoms are not definitive for CVA; pt may have had baseline facial droop seen by EMS.    CT read by neurology suggests possible CVA on left frontal.  Dr. Cyril Mourning recommends MRI of brain to delineate for certain if CVA or not.  Will discuss with [facility].         Gavin Pound. Tanveer Dobberstein, MD 06/01/13 1049

## 2013-05-31 NOTE — ED Provider Notes (Signed)
Pt received from Dr. Oletta Lamas.  Pt has h/o stroke and is on Xarelto.  Had a fall w/ head impact today and presented w/ scalp hematoma and EMS report of L-sided weakness and facial droop.  MRI of brain is non-acute.  Results discussed w/ patient and her husband.  She has no concerns/complaints currently.  Pt is well-appearing and VSS.  Advised f/u with her PCP next week and return to ER for worsening sx.  [facility] to transport home.   Arie Sabina Kailoni Vahle, PA-C 05/31/13 2100

## 2013-05-31 NOTE — ED Notes (Signed)
Assessed back of pt head with Dr. Oletta Lamas- large hematoma noted.  No evidence of laceration, blood coming from skin tear from fall.

## 2013-05-31 NOTE — ED Notes (Signed)
Pt to ED via [facility] for evaluation of a fall- Code Stroke called en route.  EMS arrival to pt home at 1545 for evaluation of pt fall in bathroom, laceration to back of head, bleeding controlled at present.  Paramedics noticed left sided facial droop and left sided weakness at 1610, last seen normal 1610.  Upon arrival pt answering questions inappropriately, speech sluggish, pt alert at present, neuro team at bedside.

## 2013-05-31 NOTE — Consult Note (Signed)
Referring Physician: Ghim    Chief Complaint: Code stroke  HPI:                                                                                                                                         [name] is an 79 y.o. female who lives in assisted living, history of right MCA stroke in 2012 in which she did receive tPA. Patient was in the bathroom with her husband today and fell backward, striking her head.  EMS was called due to her fall.  After initial assessment they noted a left facial droop and left arm drift.  In asking patient if this is new, patient states the facial droop and left arm and leg weakness is old and has been present since 2012. Patient denies any loss of consciousness but parmedics believe she was confused on arrival.  There was not report of seizure or seizure activity. Currently she is awake and oriented.  CT head shows no bleed.  AS stated, patient is on Xeralto for Afib.  PAtient is not a tPA candidate due to being on Xeralto and head trauma.   Date last known well: Date: 05/31/2013 Time last known well: Time: 15:30 tPA Given: No: on Xeralto with head trauma NIHSS:1  Past Medical History  Diagnosis Date  . Dysphagia   . Atrial fibrillation     a. Dx 05/2011 at time of CVA.  Marland Kitchen Hyperlipidemia   . HTN (hypertension)   . Hypothyroidism   . Pneumonia     "alot of pneumonia" (09/21/2012)  . Syncope and collapse     "fell @ [place]; hit left forehead"; left hip fx/notes 09/20/2012  . Vertigo   . Arthritis     "some; all over" (09/22/2011)  . Anxiety   . Depression   . Seizures      a. 05/2011 right MCA CVA in setting of new onset afib s/p thrombolysis/thrombolytics. b. Recurrent CVA 10/2011 in setting of subtherap.INR.  "only one she ever had was 3 months after her stroke" (09/21/2012  . Seizure 10/2011  . Stroke 05/2011    a. 05/2011 right MCA CVA in setting of new onset afib s/p thrombolysis/thrombolytics. b. Recurrent CVA 10/2011 in setting of subtherap.INR.  .  DVT of lower extremity (deep venous thrombosis) 01/02/2013    LLE    Past Surgical History  Procedure Laterality Date  . Lumbar disc surgery  1981  . Vaginal hysterectomy  1982  . Pleural scarification  1977?; 1986    "2 lung surgeries; my lungs had collapsed" (09/21/2012)  . Tonsillectomy and adenoidectomy  1950?  Marland Kitchen Appendectomy  1982  . Thrombolysis  05/2011    [name] 09/20/2012  . Hip arthroplasty  09/21/2012    Procedure: ARTHROPLASTY BIPOLAR HIP;  Surgeon: Kerrin Champagne, MD;  Location: [facility] OR;  Service: Orthopedics;  Laterality: Left;  . Joint replacement  Family History  Problem Relation Age of Onset  . CAD Neg Hx    Social History:  reports that she quit smoking about 33 years ago. Her smoking use included Cigarettes. She has a 15 pack-year smoking history. She has never used smokeless tobacco. She reports that  drinks alcohol. She reports that she does not use illicit drugs.  Allergies: No Known Allergies  Medications:                                                                                                                           No current facility-administered medications for this encounter.   Current Outpatient Prescriptions  Medication Sig Dispense Refill  . ALPRAZolam (XANAX) 0.5 MG tablet Take 0.5 mg by mouth at bedtime as needed for sleep or anxiety.      Marland Kitchen atorvastatin (LIPITOR) 10 MG tablet Take 10 mg by mouth daily at 6 PM.      . Calcium Carbonate-Vitamin D (CALTRATE 600+D) 600-400 MG-UNIT per tablet Take 1 tablet by mouth daily.      Marland Kitchen diltiazem (CARDIZEM CD) 120 MG 24 hr capsule Take 1 capsule (120 mg total) by mouth daily.  30 capsule  1  . docusate sodium (COLACE) 100 MG capsule Take 100 mg by mouth daily.      Marland Kitchen escitalopram (LEXAPRO) 5 MG tablet Take 5 mg by mouth daily.      Marland Kitchen HYDROcodone-acetaminophen (NORCO/VICODIN) 5-325 MG per tablet Take 1 tablet by mouth every 6 (six) hours as needed for pain.      Marland Kitchen levETIRAcetam (KEPPRA) 500 MG tablet Take  500 mg by mouth 2 (two) times daily.      Marland Kitchen levothyroxine (SYNTHROID, LEVOTHROID) 50 MCG tablet Take 50 mcg by mouth every morning.       . mirabegron ER (MYRBETRIQ) 25 MG TB24 tablet Take 25 mg by mouth daily.      . Rivaroxaban (XARELTO) 15 MG TABS tablet Take 1 tablet (15 mg total) by mouth 2 (two) times daily.  42 tablet  0     ROS:                                                                                                                                       History obtained from the patient  General ROS: negative for - chills, fatigue, fever, night sweats, weight gain or weight  loss Psychological ROS: negative for - behavioral disorder, hallucinations, memory difficulties, mood swings or suicidal ideation Ophthalmic ROS: negative for - blurry vision, double vision, eye pain or loss of vision ENT ROS: negative for - epistaxis, nasal discharge, oral lesions, sore throat, tinnitus or vertigo Allergy and Immunology ROS: negative for - hives or itchy/watery eyes Hematological and Lymphatic ROS: negative for - bleeding problems, bruising or swollen lymph nodes Endocrine ROS: negative for - galactorrhea, hair pattern changes, polydipsia/polyuria or temperature intolerance Respiratory ROS: negative for - cough, hemoptysis, shortness of breath or wheezing Cardiovascular ROS: negative for - chest pain, dyspnea on exertion, edema or irregular heartbeat Gastrointestinal ROS: negative for - abdominal pain, diarrhea, hematemesis, nausea/vomiting or stool incontinence Genito-Urinary ROS: negative for - dysuria, hematuria, incontinence or urinary frequency/urgency Musculoskeletal ROS: negative for - joint swelling or muscular weakness Neurological ROS: as noted in HPI Dermatological ROS: negative for rash and skin lesion changes  Neurologic Examination:                                                                                                      Blood pressure 144/77, pulse 95, resp. rate  16, SpO2 97.00%.   Mental Status: Alert, oriented, thought content appropriate.  Speech slightly dysarthric without evidence of aphasia.  Able to follow 3 step commands without difficulty. Cranial Nerves: II: Discs flat bilaterally; Visual fields grossly normal, pupils equal, round, reactive to light and accommodation III,IV, VI: ptosis not present, extra-ocular motions intact bilaterally V,VII: smile asymmetric on the left, facial light touch sensation decreased on the left (all old) VIII: hearing normal bilaterally IX,X: gag reflex present XI: bilateral shoulder shrug XII: midline tongue extension Motor: Right : Upper extremity   5/5    Left:     Upper extremity   49/5 (old)  Lower extremity   5/5     Lower extremity   28/5 (old) Tone and bulk:normal tone throughout; no atrophy noted Sensory: Pinprick and light touch intact throughout, bilaterally Deep Tendon Reflexes:  Right: Upper Extremity   Left: Upper extremity   biceps (C-5 to C-6) 2/4   biceps (C-5 to C-6) 2/4 tricep (C7) 2/4    triceps (C7) 2/4 Brachioradialis (C6) 2/4  Brachioradialis (C6) 2/4  Lower Extremity Lower Extremity  quadriceps (L-2 to L-4) 1/4   quadriceps (L-2 to L-4) 1/4 Achilles (S1) 1/4   Achilles (S1) 1/4  Plantars: Right: downgoing   Left: downgoing Cerebellar: normal finger-to-nose,  normal heel-to-shin test Gait: not tested.  CV: pulses palpable throughout    Results for orders placed during the hospital encounter of 05/31/13 (from the past 48 hour(s))  CBC     Status: None   Collection Time    05/31/13  4:35 PM      Result Value Range   WBC 7.6  4.0 - 10.5 K/uL   RBC 4.34  3.87 - 5.11 MIL/uL   Hemoglobin 13.8  12.0 - 15.0 g/dL   HCT 95.6  21.3 - 08.6 %   MCV 92.2  78.0 - 100.0 fL   MCH 31.8  26.0 -  34.0 pg   MCHC 34.5  30.0 - 36.0 g/dL   RDW 11.9  14.7 - 82.9 %   Platelets 161  150 - 400 K/uL  DIFFERENTIAL     Status: None   Collection Time    05/31/13  4:35 PM      Result Value Range    Neutrophils Relative % 67  43 - 77 %   Neutro Abs 5.1  1.7 - 7.7 K/uL   Lymphocytes Relative 24  12 - 46 %   Lymphs Abs 1.8  0.7 - 4.0 K/uL   Monocytes Relative 8  3 - 12 %   Monocytes Absolute 0.6  0.1 - 1.0 K/uL   Eosinophils Relative 1  0 - 5 %   Eosinophils Absolute 0.1  0.0 - 0.7 K/uL   Basophils Relative 0  0 - 1 %   Basophils Absolute 0.0  0.0 - 0.1 K/uL  POCT I-STAT TROPONIN I     Status: None   Collection Time    05/31/13  4:38 PM      Result Value Range   Troponin i, poc 0.00  0.00 - 0.08 ng/mL   Comment 3            Comment: Due to the release kinetics of cTnI,     a negative result within the first hours     of the onset of symptoms does not rule out     myocardial infarction with certainty.     If myocardial infarction is still suspected,     repeat the test at appropriate intervals.  POCT I-STAT, CHEM 8     Status: Abnormal   Collection Time    05/31/13  4:39 PM      Result Value Range   Sodium 141  135 - 145 mEq/L   Potassium 3.6  3.5 - 5.1 mEq/L   Chloride 101  96 - 112 mEq/L   BUN 12  6 - 23 mg/dL   Creatinine, Ser 5.62  0.50 - 1.10 mg/dL   Glucose, Bld 130 (*) 70 - 99 mg/dL   Calcium, Ion 8.65  7.84 - 1.30 mmol/L   TCO2 27  0 - 100 mmol/L   Hemoglobin 14.3  12.0 - 15.0 g/dL   HCT 69.6  29.5 - 28.4 %  GLUCOSE, CAPILLARY     Status: None   Collection Time    05/31/13  4:42 PM      Result Value Range   Glucose-Capillary 86  70 - 99 mg/dL  CG4 I-STAT (LACTIC ACID)     Status: None   Collection Time    05/31/13  4:42 PM      Result Value Range   Lactic Acid, Venous 1.25  0.5 - 2.2 mmol/L   Ct Head Wo Contrast  05/31/2013   **ADDENDUM** CREATED: 05/31/2013 16:57:44  Results discussed with Dr. Leroy Kennedy 05/31/2013 4:50 p.m.  **END ADDENDUM** SIGNED BY: Almedia Balls. Constance Goltz, M.D.  05/31/2013   *RADIOLOGY REPORT*  Clinical Data: Left-sided weakness.  CT HEAD WITHOUT CONTRAST  Technique:  Contiguous axial images were obtained from the base of the skull through the  vertex without contrast.  Comparison: 02/05/2013.  Findings: Series 2 image 20 raises possibility of small acute left frontal lobe infarct.  It is possible this represents partial volume averaging with a sulcus although different than on the prior exam therefore infarct is of concern.  No intracranial hemorrhage.  Remote large right hemispheric infarct with encephalomalacia right temporal lobe,  opercular region and portion of the right parietal lobe.  Subsequent mild dilation of the right lateral ventricle.  No hydrocephalus.  Within the right pons (series 9 image 32), hypodensity is noted. This may represent artifact although limited for detecting small acute infarct.  No intracranial mass lesion detected on this unenhanced exam.  Mastoid air cells, middle ear cavities and visualized sinuses are clear.  Orbital structures unremarkable.  IMPRESSION: Question small acute non hemorrhagic infarct left frontal lobe.  Remote large right hemispheric infarct.  Probable streak artifact right pons limiting evaluation for detection of small acute infarct at this level.  Global atrophy without hydrocephalus.  Changes in to Dr. Leroy Kennedy as this is a code stroke.  Original Report Authenticated By: Lacy Duverney, M.D.    Assessment and plan discussed with with attending physician and they are in agreement.    Felicie Morn PA-C [facility] [phone]  05/31/2013, 5:03 PM   Assessment: 79 y.o. female  Brought to [facility] hospital as code stroke s/p fall in bathroom. Code stroke was called due to EMS noting left facial droop and left arm drift which patient states is old since stroke in 2012. Patient is currently back to her baseline showing no new focal symptoms.  tPA was not given due to both head trauma and being on Xeralto. Although patient denies any new symptoms she cannot give a good reason for falling.  Given her old CVA  And risk factors cannot R/O TIA  Recommend: 1) MRI brain--If negative no further work up  warranted.  2) If MRI positive continue with stroke work with carotid doppler, 2 D echo, A1c, FLP 3) Continue Xeralto  Stroke Risk Factors - atrial fibrillation, hyperlipidemia and hypertension Patient seen and examined together with physician assistant and I concur with the assessment and plan.  Wyatt Portela, MD

## 2013-05-31 NOTE — Code Documentation (Signed)
79yo female from independent living at [facility] who lives with her husband fell at 80.  [facility] activated and assessed facial droop during transport.  Code Stroke called at 1618 and patient transported to [facility]. Patient denies dizziness before fall, she states she reached for the faucet and fell.  Patient sustained posterior head hematoma from fall.  Code stroke documentation documented in doc flowsheets.  NIHSS 2 on arrival for missed age and left facial droop.  Facial droop is preexisting per husband.  Patient later corrected her age. No new focal deficits.  CT head completed.  Patient is in afib on monitor and reports that she takes Xarelto.  No acute stroke treatment at this time.  Bedside handoff completed with ED RN.

## 2013-05-31 NOTE — ED Notes (Addendum)
Per husband he was assisting pt to restroom with walker and pt fell in bathroom and hit head on sink. sts pt's balance has been off since yesterday. sts "a day and a half ago she got completely weak" sts has stayed weak since. Pt. Hand grips equal. Radial pulses 2+. Skin warm and dry.   Per dr. Leroy Kennedy to be TIA protocol neuro checks.

## 2013-05-31 NOTE — ED Notes (Signed)
Pt from EMS transported to CT by [employer]. Pt returned to room with stroke team, Armed forces technical officer. Pt able to speak full sentences, alert and oriented, breathing unlabored. Bandage to left forearm.

## 2013-06-07 ENCOUNTER — Emergency Department (HOSPITAL_COMMUNITY): Payer: Medicare Other

## 2013-06-07 ENCOUNTER — Encounter (HOSPITAL_COMMUNITY): Payer: Self-pay | Admitting: Emergency Medicine

## 2013-06-07 ENCOUNTER — Inpatient Hospital Stay (HOSPITAL_COMMUNITY)
Admission: EM | Admit: 2013-06-07 | Discharge: 2013-06-09 | DRG: 194 | Disposition: A | Discharge status: Home / Self Care | Payer: Medicare Other | Attending: Internal Medicine | Admitting: Internal Medicine

## 2013-06-07 DIAGNOSIS — I6350 Cerebral infarction due to unspecified occlusion or stenosis of unspecified cerebral artery: Secondary | ICD-10-CM

## 2013-06-07 DIAGNOSIS — I69991 Dysphagia following unspecified cerebrovascular disease: Secondary | ICD-10-CM

## 2013-06-07 DIAGNOSIS — J4489 Other specified chronic obstructive pulmonary disease: Secondary | ICD-10-CM | POA: Diagnosis present

## 2013-06-07 DIAGNOSIS — J449 Chronic obstructive pulmonary disease, unspecified: Secondary | ICD-10-CM | POA: Diagnosis present

## 2013-06-07 DIAGNOSIS — R1310 Dysphagia, unspecified: Secondary | ICD-10-CM | POA: Diagnosis present

## 2013-06-07 DIAGNOSIS — I639 Cerebral infarction, unspecified: Secondary | ICD-10-CM

## 2013-06-07 DIAGNOSIS — J189 Pneumonia, unspecified organism: Principal | ICD-10-CM

## 2013-06-07 DIAGNOSIS — I4891 Unspecified atrial fibrillation: Secondary | ICD-10-CM | POA: Diagnosis present

## 2013-06-07 DIAGNOSIS — R627 Adult failure to thrive: Secondary | ICD-10-CM | POA: Diagnosis present

## 2013-06-07 DIAGNOSIS — I10 Essential (primary) hypertension: Secondary | ICD-10-CM | POA: Diagnosis present

## 2013-06-07 DIAGNOSIS — E46 Unspecified protein-calorie malnutrition: Secondary | ICD-10-CM | POA: Diagnosis present

## 2013-06-07 DIAGNOSIS — I69998 Other sequelae following unspecified cerebrovascular disease: Secondary | ICD-10-CM

## 2013-06-07 DIAGNOSIS — Z7901 Long term (current) use of anticoagulants: Secondary | ICD-10-CM

## 2013-06-07 DIAGNOSIS — R5381 Other malaise: Secondary | ICD-10-CM | POA: Diagnosis present

## 2013-06-07 DIAGNOSIS — E039 Hypothyroidism, unspecified: Secondary | ICD-10-CM | POA: Diagnosis present

## 2013-06-07 DIAGNOSIS — R531 Weakness: Secondary | ICD-10-CM

## 2013-06-07 DIAGNOSIS — Z681 Body mass index (BMI) 19 or less, adult: Secondary | ICD-10-CM

## 2013-06-07 DIAGNOSIS — F329 Major depressive disorder, single episode, unspecified: Secondary | ICD-10-CM | POA: Diagnosis present

## 2013-06-07 DIAGNOSIS — Z8673 Personal history of transient ischemic attack (TIA), and cerebral infarction without residual deficits: Secondary | ICD-10-CM

## 2013-06-07 DIAGNOSIS — F3289 Other specified depressive episodes: Secondary | ICD-10-CM | POA: Diagnosis present

## 2013-06-07 DIAGNOSIS — W19XXXA Unspecified fall, initial encounter: Secondary | ICD-10-CM

## 2013-06-07 DIAGNOSIS — Z86718 Personal history of other venous thrombosis and embolism: Secondary | ICD-10-CM

## 2013-06-07 DIAGNOSIS — F411 Generalized anxiety disorder: Secondary | ICD-10-CM | POA: Diagnosis present

## 2013-06-07 LAB — CBC WITH DIFFERENTIAL/PLATELET
Basophils Relative: 0 % (ref 0–1)
Eosinophils Relative: 0 % (ref 0–5)
HCT: 35.7 % — ABNORMAL LOW (ref 36.0–46.0)
Hemoglobin: 12.6 g/dL (ref 12.0–15.0)
Lymphocytes Relative: 5 % — ABNORMAL LOW (ref 12–46)
MCHC: 35.3 g/dL (ref 30.0–36.0)
MCV: 91.3 fL (ref 78.0–100.0)
Monocytes Absolute: 0.8 10*3/uL (ref 0.1–1.0)
Monocytes Relative: 7 % (ref 3–12)
Neutro Abs: 11.5 10*3/uL — ABNORMAL HIGH (ref 1.7–7.7)
RBC: 3.91 MIL/uL (ref 3.87–5.11)
RDW: 13.5 % (ref 11.5–15.5)

## 2013-06-07 LAB — COMPREHENSIVE METABOLIC PANEL
Albumin: 3.4 g/dL — ABNORMAL LOW (ref 3.5–5.2)
BUN: 18 mg/dL (ref 6–23)
CO2: 27 mEq/L (ref 19–32)
Calcium: 9 mg/dL (ref 8.4–10.5)
Chloride: 102 mEq/L (ref 96–112)
Creatinine, Ser: 0.75 mg/dL (ref 0.50–1.10)
GFR calc Af Amer: >90 mL/min (ref >90)
GFR calc non Af Amer: 78 mL/min — ABNORMAL LOW (ref >90)
Total Bilirubin: 0.7 mg/dL (ref 0.3–1.2)

## 2013-06-07 LAB — CG4 I-STAT (LACTIC ACID): Lactic Acid, Venous: 1.57 mmol/L (ref 0.5–2.2)

## 2013-06-07 LAB — TROPONIN I: Troponin I: <0.3 ng/mL (ref <0.30)

## 2013-06-07 MED ORDER — DEXTROSE 5 % IV SOLN
500.0000 mg | Freq: Every day | INTRAVENOUS | Status: DC
  Filled 2013-06-07: qty 500

## 2013-06-07 MED ORDER — DEXTROSE 5 % IV SOLN
500.0000 mg | Freq: Once | INTRAVENOUS | Status: AC
  Administered 2013-06-07: 500 mg via INTRAVENOUS
  Filled 2013-06-07: qty 500

## 2013-06-07 MED ORDER — DEXTROSE-NACL 5-0.9 % IV SOLN
INTRAVENOUS | Status: DC
  Administered 2013-06-08 (×2): via INTRAVENOUS

## 2013-06-07 MED ORDER — ACETAMINOPHEN 500 MG PO TABS
1000.0000 mg | ORAL_TABLET | Freq: Once | ORAL | Status: DC
  Filled 2013-06-07: qty 2

## 2013-06-07 MED ORDER — POLYETHYLENE GLYCOL 3350 17 G PO PACK
17.0000 g | PACK | Freq: Every day | ORAL | Status: DC
  Administered 2013-06-08 – 2013-06-09 (×2): 17 g via ORAL
  Filled 2013-06-07 (×2): qty 1

## 2013-06-07 MED ORDER — LEVOTHYROXINE SODIUM 50 MCG PO TABS
50.0000 ug | ORAL_TABLET | Freq: Every day | ORAL | Status: DC
  Administered 2013-06-08 – 2013-06-09 (×2): 50 ug via ORAL
  Filled 2013-06-07 (×4): qty 1

## 2013-06-07 MED ORDER — ONDANSETRON HCL 4 MG/2ML IJ SOLN: 4.0000 mg | Freq: Four times a day (QID) | INTRAMUSCULAR | Status: DC | PRN

## 2013-06-07 MED ORDER — DEXTROSE 5 % IV SOLN
1.0000 g | Freq: Once | INTRAVENOUS | Status: AC
  Administered 2013-06-07: 1 g via INTRAVENOUS
  Filled 2013-06-07: qty 10

## 2013-06-07 MED ORDER — DILTIAZEM HCL 25 MG/5ML IV SOLN
10.0000 mg | Freq: Once | INTRAVENOUS | Status: AC
  Administered 2013-06-07: 10 mg via INTRAVENOUS
  Filled 2013-06-07: qty 5

## 2013-06-07 MED ORDER — LEVETIRACETAM 500 MG PO TABS
500.0000 mg | ORAL_TABLET | Freq: Two times a day (BID) | ORAL | Status: DC
  Administered 2013-06-08 – 2013-06-09 (×4): 500 mg via ORAL
  Filled 2013-06-07 (×5): qty 1

## 2013-06-07 MED ORDER — ONDANSETRON HCL 4 MG PO TABS: 4.0000 mg | ORAL_TABLET | Freq: Four times a day (QID) | ORAL | Status: DC | PRN

## 2013-06-07 MED ORDER — ESCITALOPRAM OXALATE 5 MG PO TABS
5.0000 mg | ORAL_TABLET | Freq: Every day | ORAL | Status: DC
  Administered 2013-06-08 – 2013-06-09 (×2): 5 mg via ORAL
  Filled 2013-06-07 (×2): qty 1

## 2013-06-07 MED ORDER — DEXTROSE 5 % IV SOLN
1.0000 g | Freq: Every day | INTRAVENOUS | Status: DC
  Filled 2013-06-07: qty 10

## 2013-06-07 MED ORDER — DOCUSATE SODIUM 100 MG PO CAPS
100.0000 mg | ORAL_CAPSULE | Freq: Every day | ORAL | Status: DC
  Administered 2013-06-08 (×2): 100 mg via ORAL
  Filled 2013-06-07 (×3): qty 1

## 2013-06-07 MED ORDER — CALCIUM CARBONATE-VITAMIN D 500-200 MG-UNIT PO TABS
1.0000 | ORAL_TABLET | Freq: Every day | ORAL | Status: DC
  Administered 2013-06-08 (×2): 1 via ORAL
  Filled 2013-06-07 (×3): qty 1

## 2013-06-07 MED ORDER — ATORVASTATIN CALCIUM 10 MG PO TABS
10.0000 mg | ORAL_TABLET | Freq: Every day | ORAL | Status: DC
  Administered 2013-06-08 – 2013-06-09 (×2): 10 mg via ORAL
  Filled 2013-06-07 (×2): qty 1

## 2013-06-07 MED ORDER — SODIUM CHLORIDE 0.9 % IV BOLUS (SEPSIS)
500.0000 mL | Freq: Once | INTRAVENOUS | Status: AC
  Administered 2013-06-07: 500 mL via INTRAVENOUS

## 2013-06-07 MED ORDER — MIRABEGRON ER 25 MG PO TB24
25.0000 mg | ORAL_TABLET | Freq: Every day | ORAL | Status: DC
  Administered 2013-06-08 – 2013-06-09 (×2): 25 mg via ORAL
  Filled 2013-06-07 (×2): qty 1

## 2013-06-07 MED ORDER — DIGOXIN 125 MCG PO TABS
0.1250 mg | ORAL_TABLET | Freq: Every day | ORAL | Status: DC
  Administered 2013-06-08 – 2013-06-09 (×2): 0.125 mg via ORAL
  Filled 2013-06-07 (×2): qty 1

## 2013-06-07 MED ORDER — SODIUM CHLORIDE 0.9 % IJ SOLN
3.0000 mL | Freq: Two times a day (BID) | INTRAMUSCULAR | Status: DC
Start: 2013-06-07 — End: 2013-06-09

## 2013-06-07 MED ORDER — ALPRAZOLAM 0.5 MG PO TABS
0.5000 mg | ORAL_TABLET | Freq: Two times a day (BID) | ORAL | Status: DC
  Administered 2013-06-08 – 2013-06-09 (×4): 0.5 mg via ORAL
  Filled 2013-06-07 (×4): qty 1

## 2013-06-07 MED ORDER — RIVAROXABAN 15 MG PO TABS
15.0000 mg | ORAL_TABLET | Freq: Every day | ORAL | Status: DC
  Administered 2013-06-08: 15 mg via ORAL
  Filled 2013-06-07 (×2): qty 1

## 2013-06-07 NOTE — ED Notes (Signed)
Patient transported to X-ray 

## 2013-06-07 NOTE — ED Notes (Signed)
Patient transported to CT 

## 2013-06-07 NOTE — ED Notes (Signed)
Lab at bedside

## 2013-06-07 NOTE — ED Notes (Signed)
BIB [facility]. [place]. EMS call for "stiff". Per EMS, lethargy. Shallow breathing. Recent discharge for fall

## 2013-06-07 NOTE — ED Notes (Signed)
PT unable to swallow PO medication

## 2013-06-07 NOTE — H&P (Signed)
[facility] History and Physical  [name] RUE:[id number] DOB: Feb 27, 1931    PCP:   [name], MD   Chief Complaint: weakness.  HPI: [name] is an 79 y.o. female with hx of afib, prior CVA with dysphagia, hyperlipidemia, HTN, hypothyroidism, hx of Sz on ACD, resident of assisted living, brought to the ER as she has been feeling weak, unable to eat, and having lightheadedness.  She was found in the ER to be in afib with RVR.  She was given IV diltizem which immediately control her rate.  Further evaluation included a CXR which showed patchy infiltrate in the right base and evidence of COPD.  She has a leukocytosis with a WBC of 13K, and normal serology to included normal LFTs, electrolytes, and normal renal fx tests.  She was started on IV Rocephin and iV zithromax, and hospitalist was asked to admit her for CAP, afib with RVR, lightheadedness and generalized weakness.   Rewiew of Systems:  Constitutional: Negative for  fever and chills. No significant weight loss or weight gain Eyes: Negative for eye pain, redness and discharge, diplopia, visual changes, or flashes of light. ENMT: Negative for ear pain, hoarseness, nasal congestion, sinus pressure and sore throat. No headaches; tinnitus, drooling, or problem swallowing. Cardiovascular: Negative for chest pain,  diaphoresis, dyspnea and peripheral edema. ; No orthopnea, PND Respiratory: Negative for  hemoptysis, wheezing and stridor. No pleuritic chestpain. Gastrointestinal: Negative for nausea, vomiting, diarrhea, constipation, abdominal pain, melena, blood in stool, hematemesis, jaundice and rectal bleeding.    Genitourinary: Negative for frequency, dysuria, incontinence,flank pain and hematuria; Musculoskeletal: Negative for back pain and neck pain. Negative for swelling and trauma.;  Skin: . Negative for pruritus, rash, abrasions, bruising and skin lesion.; ulcerations Neuro: Negative for headache,  lightheadedness and neck stiffness. Negative for weakness, altered level of consciousness , altered mental status, extremity weakness, burning feet, involuntary movement, seizure and syncope.  Psych: negative for anxiety, depression, insomnia, tearfulness, panic attacks, hallucinations, paranoia, suicidal or homicidal ideation    Past Medical History  Diagnosis Date  . Dysphagia   . Atrial fibrillation     a. Dx 05/2011 at time of CVA.  Marland Kitchen Hyperlipidemia   . HTN (hypertension)   . Hypothyroidism   . Pneumonia     "alot of pneumonia" (09/21/2012)  . Syncope and collapse     "fell @ [place]; hit left forehead"; left hip fx/notes 09/20/2012  . Vertigo   . Arthritis     "some; all over" (09/22/2011)  . Anxiety   . Depression   . Seizures      a. 05/2011 right MCA CVA in setting of new onset afib s/p thrombolysis/thrombolytics. b. Recurrent CVA 10/2011 in setting of subtherap.INR.  "only one she ever had was 3 months after her stroke" (09/21/2012  . Seizure 10/2011  . Stroke 05/2011    a. 05/2011 right MCA CVA in setting of new onset afib s/p thrombolysis/thrombolytics. b. Recurrent CVA 10/2011 in setting of subtherap.INR.  . DVT of lower extremity (deep venous thrombosis) 01/02/2013    LLE    Past Surgical History  Procedure Laterality Date  . Lumbar disc surgery  1981  . Vaginal hysterectomy  1982  . Pleural scarification  1977?; 1986    "2 lung surgeries; my lungs had collapsed" (09/21/2012)  . Tonsillectomy and adenoidectomy  1950?  Marland Kitchen Appendectomy  1982  . Thrombolysis  05/2011    [name] 09/20/2012  . Hip arthroplasty  09/21/2012    Procedure:  ARTHROPLASTY BIPOLAR HIP;  Surgeon: Kerrin Champagne, MD;  Location: [facility] OR;  Service: Orthopedics;  Laterality: Left;  . Joint replacement      Medications:  HOME MEDS: Prior to Admission medications   Medication Sig Start Date End Date Taking? Authorizing Provider  ALPRAZolam Prudy Feeler) 0.5 MG tablet Take 0.5 mg by mouth 2 (two) times daily.    Yes  Historical Provider, MD  atorvastatin (LIPITOR) 10 MG tablet Take 10 mg by mouth daily.  11/17/11  Yes Nishant Dhungel, MD  Calcium Carbonate-Vitamin D (CALTRATE 600+D) 600-400 MG-UNIT per tablet Take 1 tablet by mouth at bedtime.    Yes Historical Provider, MD  digoxin (LANOXIN) 0.125 MG tablet Take 0.125 mg by mouth daily.   Yes Historical Provider, MD  docusate sodium (COLACE) 100 MG capsule Take 100 mg by mouth at bedtime.    Yes Historical Provider, MD  escitalopram (LEXAPRO) 5 MG tablet Take 5 mg by mouth daily.   Yes Historical Provider, MD  levETIRAcetam (KEPPRA) 500 MG tablet Take 500 mg by mouth 2 (two) times daily. 11/17/11 11/16/13 Yes Nishant Dhungel, MD  levothyroxine (SYNTHROID, LEVOTHROID) 50 MCG tablet Take 50 mcg by mouth daily.    Yes Historical Provider, MD  mirabegron ER (MYRBETRIQ) 25 MG TB24 tablet Take 25 mg by mouth daily.   Yes Historical Provider, MD  Multiple Vitamin (MULTIVITAMIN WITH MINERALS) TABS tablet Take 1 tablet by mouth daily.   Yes Historical Provider, MD  OVER THE COUNTER MEDICATION Apply 1 application topically daily. Cream for sores on tailbone   Yes Historical Provider, MD  polyethylene glycol (MIRALAX / GLYCOLAX) packet Take 17 g by mouth daily.   Yes Historical Provider, MD  Rivaroxaban (XARELTO) 15 MG TABS tablet Take 1 tablet (15 mg total) by mouth 2 (two) times daily. 01/03/13   Rhetta Mura, MD     Allergies:  No Known Allergies  Social History:   reports that she quit smoking about 33 years ago. Her smoking use included Cigarettes. She has a 15 pack-year smoking history. She has never used smokeless tobacco. She reports that  drinks alcohol. She reports that she does not use illicit drugs.  Family History: Family History  Problem Relation Age of Onset  . CAD Neg Hx      Physical Exam: Filed Vitals:   06/07/13 1921 06/07/13 1946 06/07/13 2100  BP: 137/77  112/73  Pulse: 120  101  Temp: 98.8 F (37.1 C) 101.7 F (38.7 C)    TempSrc: Oral Rectal   Resp: 20  18  SpO2: 92%  96%   Blood pressure 112/73, pulse 101, temperature 101.7 F (38.7 C), temperature source Rectal, resp. rate 18, SpO2 96.00%.  GEN:  Pleasant  patient lying in the stretcher in no acute distress; cooperative with exam. PSYCH:  alert and oriented x4; does not appear anxious or depressed; affect is appropriate. HEENT: Mucous membranes pink and anicteric; PERRLA; EOM intact; no cervical lymphadenopathy nor thyromegaly or carotid bruit; no JVD; There were no stridor. Neck is very supple. Breasts:: Not examined CHEST WALL: No tenderness CHEST: Normal respiration, no significant wheezing but diffuse rhonhi are heard. HEART: irregular rate and rhythm.  There are no murmur, rub, or gallops.   BACK: No kyphosis or scoliosis; no CVA tenderness ABDOMEN: soft and non-tender; no masses, no organomegaly, normal abdominal bowel sounds; no pannus; no intertriginous candida. There is no rebound and no distention. Rectal Exam: Not done EXTREMITIES: No bone or joint deformity; age-appropriate arthropathy of the  hands and knees; no edema; no ulcerations.  There is no calf tenderness. Genitalia: not examined PULSES: 2+ and symmetric SKIN: Normal hydration no rash or ulceration CNS: Cranial nerves 2-12 grossly intact no focal lateralizing neurologic deficit.  Speech is fluent; uvula elevated with phonation, facial symmetry and tongue midline. DTR are normal bilaterally, cerebella exam is intact, barbinski is negative and strengths are equaled bilaterally.  No sensory loss.   Labs on Admission:  Basic Metabolic Panel:  Recent Labs Lab 06/07/13 2030  NA 139  K 3.5  CL 102  CO2 27  GLUCOSE 139*  BUN 18  CREATININE 0.75  CALCIUM 9.0   Liver Function Tests:  Recent Labs Lab 06/07/13 2030  AST 22  ALT 19  ALKPHOS 64  BILITOT 0.7  PROT 6.7  ALBUMIN 3.4*   No results found for this basename: LIPASE, AMYLASE,  in the last 168 hours No results  found for this basename: AMMONIA,  in the last 168 hours CBC:  Recent Labs Lab 06/07/13 2030  WBC 13.0*  NEUTROABS 11.5*  HGB 12.6  HCT 35.7*  MCV 91.3  PLT 169   Cardiac Enzymes: No results found for this basename: CKTOTAL, CKMB, CKMBINDEX, TROPONINI,  in the last 168 hours  CBG: No results found for this basename: GLUCAP,  in the last 168 hours   Radiological Exams on Admission: Dg Chest 2 View  06/07/2013   CLINICAL DATA:  Fatigue and hypertension  EXAM: CHEST  2 VIEW  COMPARISON:  Feb 05, 2013  FINDINGS: There is patchy infiltrate in the right base. There is underlying emphysema. Heart size is normal. Pulmonary vascularity reflects underlying emphysema.  There is marked pectus excavatum. No pneumothorax. No adenopathy. There is atherosclerotic change in the aorta.  IMPRESSION: Patchy infiltrate right base. Underlying emphysema. Pectus excavatum.   Electronically Signed   By: Bretta Bang   On: 06/07/2013 20:16   Ct Head Wo Contrast  06/07/2013   CLINICAL DATA:  Lethargy, shallow breathing, recent discharge for fall.  EXAM: CT HEAD WITHOUT CONTRAST  TECHNIQUE: Contiguous axial images were obtained from the base of the skull through the vertex without intravenous contrast.  COMPARISON:  MRI brain dated 05/31/2013.  FINDINGS: No evidence of parenchymal hemorrhage or extra-axial fluid collection. No mass lesion, mass effect, or midline shift.  No CT evidence of acute infarction.  Encephalomalacic changes related to remote right MCA distribution infarct.  Subcortical white matter and periventricular small vessel ischemic changes. Intracranial atherosclerosis.  Global cortical atrophy with prominence of the bifrontal extra-axial CSF spaces. No ventriculomegaly.  The visualized paranasal sinuses are essentially clear. The mastoid air cells are unopacified.  No evidence of calvarial fracture.  IMPRESSION: No evidence of acute intracranial abnormality.  Encephalomalacic changes related to  prior right MCA distribution infarct.  Atrophy with small vessel ischemic changes and intracranial atherosclerosis.   Electronically Signed   By: Charline Bills M.D.   On: 06/07/2013 20:27    EKG: Independently reviewed. afib with RVR.  Assessment/Plan Present on Admission:  . Atrial fibrillation . Hypothyroidism . HTN (hypertension) . Malnutrition CAP  PLAN: Will admit her for failure to thrive, CAP, afib with RVR, and generalized weakness.  She has several reasons to feel weak, including having PNA, tachycardia, and malnutrition.  Will continue IV Rocephin and Zithromax given in the ER.  For her Afib, will continue her Xarelto, and control her rate.  Check TSH as she has hypothyroid.  She is stable, full code, and will be  admitted to [facility] service.  Her home meds have been continued.  Thank you for asking me to participate in the care of this nice patient.  Other plans as per orders.  Code Status: FULL Unk Lightning, MD. [facility] Pager [phone] 7pm to 7am.  06/07/2013, 10:00 PM

## 2013-06-07 NOTE — ED Provider Notes (Signed)
CSN: [id number]     Arrival date & time 06/07/13  1910 History   First MD Initiated Contact with Patient 06/07/13 1916     Chief Complaint  Patient presents with  . Fatigue   (Consider location/radiation/quality/duration/timing/severity/associated sxs/prior Treatment) The history is provided by the patient.   79 year old female with past medical history of atrial fibrillation (on diltiazem, digoxin and Xarelto), hypertension, hypothyroidism, CVA (left-sided weakness)  here with complaint of generalized weakness.  The husband reports that he was trying to feed her tonight, but was unsuccessful. He then tried to help her stand up and to get her to bed, but was unable to get her out of the chair. He then called 911. He denies noticing anything out of the ordinary besides her generalized weakness at that time including no LOC, no slurred speech and no complaints at the time. The patient reports feeling weak overall. She denies being weak on one side. She denies numbness, headache, chest pain, shortness of breath, abdominal pain, vomiting, diarrhea.  She did miss several of her medications, but the husband is unsure which ones she did not take.   Past Medical History  Diagnosis Date  . Dysphagia   . Atrial fibrillation     a. Dx 05/2011 at time of CVA.  Marland Kitchen Hyperlipidemia   . HTN (hypertension)   . Hypothyroidism   . Pneumonia     "alot of pneumonia" (09/21/2012)  . Syncope and collapse     "fell @ [place]; hit left forehead"; left hip fx/notes 09/20/2012  . Vertigo   . Arthritis     "some; all over" (09/22/2011)  . Anxiety   . Depression   . Seizures      a. 05/2011 right MCA CVA in setting of new onset afib s/p thrombolysis/thrombolytics. b. Recurrent CVA 10/2011 in setting of subtherap.INR.  "only one she ever had was 3 months after her stroke" (09/21/2012  . Seizure 10/2011  . Stroke 05/2011    a. 05/2011 right MCA CVA in setting of new onset afib s/p thrombolysis/thrombolytics. b. Recurrent CVA  10/2011 in setting of subtherap.INR.  . DVT of lower extremity (deep venous thrombosis) 01/02/2013    LLE   Past Surgical History  Procedure Laterality Date  . Lumbar disc surgery  1981  . Vaginal hysterectomy  1982  . Pleural scarification  1977?; 1986    "2 lung surgeries; my lungs had collapsed" (09/21/2012)  . Tonsillectomy and adenoidectomy  1950?  Marland Kitchen Appendectomy  1982  . Thrombolysis  05/2011    [name] 09/20/2012  . Hip arthroplasty  09/21/2012    Procedure: ARTHROPLASTY BIPOLAR HIP;  Surgeon: Kerrin Champagne, MD;  Location: [facility] OR;  Service: Orthopedics;  Laterality: Left;  . Joint replacement     Family History  Problem Relation Age of Onset  . CAD Neg Hx    History  Substance Use Topics  . Smoking status: Former Smoker -- 0.50 packs/day for 30 years    Types: Cigarettes    Quit date: 11/16/1979  . Smokeless tobacco: Never Used  . Alcohol Use: 0.0 oz/week     Comment: 01/02/2013 "1-2 glasss of wine/wk when I drink; haven't had any for a long time"   OB History   Grav Para Term Preterm Abortions TAB SAB Ect Mult Living                 Review of Systems  Constitutional: Negative for fever and chills.  HENT: Negative for sore throat, trouble swallowing,  neck pain and neck stiffness.   Respiratory: Positive for cough. Negative for chest tightness and shortness of breath.   Cardiovascular: Negative for chest pain and leg swelling.  Gastrointestinal: Negative for nausea, vomiting, abdominal pain and diarrhea.  Genitourinary: Negative for frequency and difficulty urinating.  Musculoskeletal: Negative for joint swelling.  Skin: Negative for rash.  Neurological: Negative for dizziness, speech difficulty, weakness, numbness and headaches.  All other systems reviewed and are negative.    Allergies  Review of patient's allergies indicates no known allergies.  Home Medications   Current Outpatient Rx  Name  Route  Sig  Dispense  Refill  . ALPRAZolam (XANAX) 0.5 MG tablet    Oral   Take 0.5 mg by mouth 2 (two) times daily.          Marland Kitchen atorvastatin (LIPITOR) 10 MG tablet   Oral   Take 10 mg by mouth daily.          . Calcium Carbonate-Vitamin D (CALTRATE 600+D) 600-400 MG-UNIT per tablet   Oral   Take 1 tablet by mouth at bedtime.          . digoxin (LANOXIN) 0.125 MG tablet   Oral   Take 0.125 mg by mouth daily.         Marland Kitchen docusate sodium (COLACE) 100 MG capsule   Oral   Take 100 mg by mouth at bedtime.          Marland Kitchen escitalopram (LEXAPRO) 5 MG tablet   Oral   Take 5 mg by mouth daily.         Marland Kitchen levETIRAcetam (KEPPRA) 500 MG tablet   Oral   Take 500 mg by mouth 2 (two) times daily.         Marland Kitchen levothyroxine (SYNTHROID, LEVOTHROID) 50 MCG tablet   Oral   Take 50 mcg by mouth daily.          . mirabegron ER (MYRBETRIQ) 25 MG TB24 tablet   Oral   Take 25 mg by mouth daily.         . Multiple Vitamin (MULTIVITAMIN WITH MINERALS) TABS tablet   Oral   Take 1 tablet by mouth daily.         Marland Kitchen OVER THE COUNTER MEDICATION   Topical   Apply 1 application topically daily. Cream for sores on tailbone         . polyethylene glycol (MIRALAX / GLYCOLAX) packet   Oral   Take 17 g by mouth daily.         . Rivaroxaban (XARELTO) 15 MG TABS tablet   Oral   Take 1 tablet (15 mg total) by mouth 2 (two) times daily.   42 tablet   0    BP 137/77  Pulse 120  Temp(Src) 101.7 F (38.7 C) (Rectal)  Resp 20  SpO2 92% Physical Exam  Vitals reviewed. Constitutional: She is oriented to person, place, and time. She appears well-developed.  Appears weak and frail Cachectic  HENT:  Right Ear: External ear normal.  Left Ear: External ear normal.  Mouth/Throat: No oropharyngeal exudate.  Eyes: Conjunctivae and EOM are normal. Pupils are equal, round, and reactive to light.  Neck: Normal range of motion. Neck supple.  Cardiovascular: Normal heart sounds and intact distal pulses.  An irregularly irregular rhythm present. Tachycardia  present.  Exam reveals no gallop and no friction rub.   No murmur heard. Pulmonary/Chest: Effort normal and breath sounds normal.  Dry crackles in the bases  Abdominal: Soft. Bowel sounds are normal. She exhibits no distension. There is no tenderness.  Musculoskeletal: Normal range of motion. She exhibits no edema.  Neurological: She is alert and oriented to person, place, and time. No cranial nerve deficit.  4/5 gross strength of left upper extremity and left lower extremity 5/5 gross strength of right upper extremity and right lower extremity Gross sensation to light touch intact Left-sided facial droop  Skin: Skin is warm and dry. No rash noted.  Psychiatric: She has a normal mood and affect.    ED Course  Procedures (including critical care time) Labs Review Labs Reviewed  CBC WITH DIFFERENTIAL - Abnormal; Notable for the following:    WBC 13.0 (*)    HCT 35.7 (*)    Neutrophils Relative % 89 (*)    Neutro Abs 11.5 (*)    Lymphocytes Relative 5 (*)    Lymphs Abs 0.6 (*)    All other components within normal limits  COMPREHENSIVE METABOLIC PANEL - Abnormal; Notable for the following:    Glucose, Bld 139 (*)    Albumin 3.4 (*)    GFR calc non Af Amer 78 (*)    All other components within normal limits  CULTURE, BLOOD (ROUTINE X 2)  CULTURE, BLOOD (ROUTINE X 2)  TROPONIN I  URINALYSIS, ROUTINE W REFLEX MICROSCOPIC  CG4 I-STAT (LACTIC ACID)   Imaging Review Dg Chest 2 View  06/07/2013   CLINICAL DATA:  Fatigue and hypertension  EXAM: CHEST  2 VIEW  COMPARISON:  Feb 05, 2013  FINDINGS: There is patchy infiltrate in the right base. There is underlying emphysema. Heart size is normal. Pulmonary vascularity reflects underlying emphysema.  There is marked pectus excavatum. No pneumothorax. No adenopathy. There is atherosclerotic change in the aorta.  IMPRESSION: Patchy infiltrate right base. Underlying emphysema. Pectus excavatum.   Electronically Signed   By: Bretta Bang    On: 06/07/2013 20:16   Ct Head Wo Contrast  06/07/2013   CLINICAL DATA:  Lethargy, shallow breathing, recent discharge for fall.  EXAM: CT HEAD WITHOUT CONTRAST  TECHNIQUE: Contiguous axial images were obtained from the base of the skull through the vertex without intravenous contrast.  COMPARISON:  MRI brain dated 05/31/2013.  FINDINGS: No evidence of parenchymal hemorrhage or extra-axial fluid collection. No mass lesion, mass effect, or midline shift.  No CT evidence of acute infarction.  Encephalomalacic changes related to remote right MCA distribution infarct.  Subcortical white matter and periventricular small vessel ischemic changes. Intracranial atherosclerosis.  Global cortical atrophy with prominence of the bifrontal extra-axial CSF spaces. No ventriculomegaly.  The visualized paranasal sinuses are essentially clear. The mastoid air cells are unopacified.  No evidence of calvarial fracture.  IMPRESSION: No evidence of acute intracranial abnormality.  Encephalomalacic changes related to prior right MCA distribution infarct.  Atrophy with small vessel ischemic changes and intracranial atherosclerosis.   Electronically Signed   By: Charline Bills M.D.   On: 06/07/2013 20:27    Date: 06/07/2013  Rate: 120  Rhythm: atrial fibrillation  QRS Axis: right  Intervals: QT prolonged  ST/T Wave abnormalities: nonspecific repol abnormality  Conduction Disutrbances:none  Narrative Interpretation: a fib with RVR, nonspecific repol abnormality, RAD  Old EKG Reviewed: No significant change from EKG 05/31/13    MDM   79 year old female with past medical history of atrial fibrillation (on diltiazem, digoxin and Xarelto), hypertension, hypothyroidism, CVA (left-sided weakness)  here with complaint of generalized weakness.  Febrile rectal temperature, irr irr tachycardia consistent with  A. fib with RVR. Blood pressure adequate.  Exam is as documented above.  Very weak in general. I had to support her  weight completely in order to auscultate her lungs. She normally ambulates with a walker.  Will search for a source of infection. EKG, troponin. Lactate. 10 mg diltiazem for A. fib with RVR. Will monitor closely.  10:20 PM Patient to be admitted to IM.  PNA on CXR - will tx for CAP, cultures.  Patient and family updated.  Clinical Impression: 1. Community acquired pneumonia   2. Generalized weakness   3. Atrial fibrillation   4. CVA (cerebral infarction)     Disposition: Admit  Condition: Fair   I have discussed the results, Dx and Tx plan. They understand and agree with plan for admission.  Exam unchanged at admission.   Pt seen in conjunction with Dr. Bernette Mayers.  Reine Just. Beverely Pace, MD Emergency Medicine PGY-III [phone]   Oleh Genin, MD 06/08/13 [phone]

## 2013-06-07 NOTE — ED Notes (Signed)
Attempted to do an in and out. No urine collected. Pt asked if she produces urine and she said "very little". Her brief was changed this morning and is still dry.

## 2013-06-07 NOTE — ED Notes (Signed)
Per PT husband: "she had slumped over in the chair and i couldn't get her up because she was stiff"

## 2013-06-08 ENCOUNTER — Encounter (HOSPITAL_COMMUNITY): Payer: Self-pay

## 2013-06-08 LAB — URINALYSIS, ROUTINE W REFLEX MICROSCOPIC
Bilirubin Urine: NEGATIVE
Glucose, UA: NEGATIVE mg/dL
Hgb urine dipstick: NEGATIVE
Ketones, ur: NEGATIVE mg/dL
Specific Gravity, Urine: 1.017 (ref 1.005–1.030)
pH: 6 (ref 5.0–8.0)

## 2013-06-08 LAB — URINE MICROSCOPIC-ADD ON

## 2013-06-08 MED ORDER — LEVOFLOXACIN 500 MG PO TABS: 500.0000 mg | ORAL_TABLET | Freq: Every day | ORAL | Status: DC

## 2013-06-08 MED ORDER — GUAIFENESIN-DM 100-10 MG/5ML PO SYRP: 5.0000 mL | ORAL_SOLUTION | ORAL | Status: DC | PRN

## 2013-06-08 MED ORDER — ENSURE COMPLETE PO LIQD
237.0000 mL | Freq: Two times a day (BID) | ORAL | Status: DC
  Administered 2013-06-08: 237 mL via ORAL

## 2013-06-08 MED ORDER — LEVOFLOXACIN 750 MG PO TABS
750.0000 mg | ORAL_TABLET | ORAL | Status: DC
  Administered 2013-06-08: 750 mg via ORAL
  Filled 2013-06-08: qty 1

## 2013-06-08 MED ORDER — HYDROCOD POLST-CHLORPHEN POLST 10-8 MG/5ML PO LQCR
5.0000 mL | Freq: Two times a day (BID) | ORAL | Status: DC | PRN
  Administered 2013-06-08 – 2013-06-09 (×2): 5 mL via ORAL
  Filled 2013-06-08 (×2): qty 5

## 2013-06-08 NOTE — Evaluation (Signed)
Physical Therapy Evaluation Patient Details Name: [name] MRN: [id number] DOB: 10/04/1930 Today's Date: 06/08/2013 Time: 4098-1191 PT Time Calculation (min): 31 min  PT Assessment / Plan / Recommendation History of Present Illness  [name] is an 79 y.o. female with hx of afib, prior CVA with dysphagia, hyperlipidemia, HTN, hypothyroidism, hx of Sz on ACD, resident of assisted living, brought to the ER as she has been feeling weak, unable to eat, and having lightheadedness. She was found in the ER to be in afib with RVR. She was given IV diltizem which immediately control her rate. Further evaluation included a CXR which showed patchy infiltrate in the right base and evidence of COPD. She has a leukocytosis with a WBC of 13K, and normal serology to included normal LFTs, electrolytes, and normal renal fx tests. She was started on IV Rocephin and iV zithromax, and hospitalist was asked to admit her for CAP, afib with RVR, lightheadedness and generalized weakness.    Clinical Impression  Presents to PT with below impairments impacting independence and PLOF. Will benefit physical therapy in the acute setting to maximize safety and independence. Very weak requiring at least modA for all transfers. Tends to fall to the right. Patient and husband agree they would be unable to manage at home at this time (they live in ILF).     PT Assessment  Patient needs continued PT services    Follow Up Recommendations  SNF    Does the patient have the potential to tolerate intense rehabilitation      Barriers to Discharge        Equipment Recommendations  None recommended by PT    Recommendations for Other Services     Frequency Min 3X/week    Precautions / Restrictions   orders for strict bed rest, spoke with Dr. Rhona Leavens who is OK with PT eval and will d/c strict bed rest  Pertinent Vitals/Pain Denies pain, PIV began bleeding and RN notified      Mobility  Bed Mobility Bed Mobility:  Supine to Sit;Sitting - Scoot to Edge of Bed Supine to Sit: 4: Min assist;HOB elevated;With rails Sitting - Scoot to Edge of Bed: 4: Min assist Details for Bed Mobility Assistance: sequencing and faciliatory cues for planning of efficiency and for follow through of movement, slow to process and increased effort noted; use of pad to scoot hips Transfers Transfers: Systems analyst;Sit to Stand;Stand to Sit Sit to Stand: 1: +2 Total assist Sit to Stand: Patient Percentage: 60% Stand to Sit: 1: +2 Total assist Stand to Sit: Patient Percentage: 60% Squat Pivot Transfers: 2: Max assist Details for Transfer Assistance: initial squat transfer bed->3in1 with max facilitation for stability and to sequence steps, pt with heavy posterior lean and difficulty initiating steps; sit<>stand x3 with cues for hand placement and anterior translation of trunk standing better with use of arm rests however needs at least modA stability assist to stabilize as she reaches for RW; mod facilitation to bed and control descent to chair Ambulation/Gait Ambulation/Gait Assistance: 3: Mod assist (with second person for IV and gaurding) Ambulation Distance (Feet): 20 Feet Assistive device: Rolling walker Ambulation/Gait Assistance Details: facilitation for stability and cues to maintain midline especially with turns, moderate lateral lean to the right, large exagerated steps throwing her weight posteriorly Gait Pattern: Step-through pattern;Trunk flexed Gait velocity: decreased    Exercises     PT Diagnosis: Difficulty walking;Abnormality of gait;Generalized weakness  PT Problem List: Decreased strength;Decreased activity tolerance;Decreased balance;Decreased mobility;Decreased  coordination;Decreased knowledge of use of DME PT Treatment Interventions: DME instruction;Gait training;Functional mobility training;Therapeutic activities;Therapeutic exercise;Balance training;Neuromuscular re-education;Patient/family  education     PT Goals(Current goals can be found in the care plan section) Acute Rehab PT Goals Patient Stated Goal: stronger PT Goal Formulation: With patient Time For Goal Achievement: 06/15/13 Potential to Achieve Goals: Good  Visit Information  Last PT Received On: 06/08/13 Assistance Needed: +2 History of Present Illness: [name] is an 79 y.o. female with hx of afib, prior CVA with dysphagia, hyperlipidemia, HTN, hypothyroidism, hx of Sz on ACD, resident of assisted living, brought to the ER as she has been feeling weak, unable to eat, and having lightheadedness. She was found in the ER to be in afib with RVR. She was given IV diltizem which immediately control her rate. Further evaluation included a CXR which showed patchy infiltrate in the right base and evidence of COPD. She has a leukocytosis with a WBC of 13K, and normal serology to included normal LFTs, electrolytes, and normal renal fx tests. She was started on IV Rocephin and iV zithromax, and hospitalist was asked to admit her for CAP, afib with RVR, lightheadedness and generalized weakness.         Prior Functioning       Cognition  Cognition Arousal/Alertness: Awake/alert Behavior During Therapy: WFL for tasks assessed/performed Overall Cognitive Status: Within Functional Limits for tasks assessed Area of Impairment: Problem solving Following Commands: Follows one step commands consistently Problem Solving: Slow processing;Difficulty sequencing    Extremity/Trunk Assessment Upper Extremity Assessment Upper Extremity Assessment: Generalized weakness Lower Extremity Assessment Lower Extremity Assessment: Generalized weakness Cervical / Trunk Assessment Cervical / Trunk Assessment: Kyphotic Cervical / Trunk Exceptions: flexed trunk with right lateral lean   Balance Balance Balance Assessed: Yes Static Standing Balance Static Standing - Balance Support: Bilateral upper extremity supported Static Standing  - Level of Assistance: 4: Min assist;3: Mod assist  End of Session PT - End of Session Equipment Utilized During Treatment: Gait belt Activity Tolerance: Patient tolerated treatment well Patient left: in chair;with call bell/phone within reach Nurse Communication: Mobility status  GP     Ludger Nutting 06/08/2013, 3:24 PM

## 2013-06-08 NOTE — Progress Notes (Signed)
[facility] PROGRESS NOTE  [name] NWG:[id number] DOB: 07/24/1931 DOA: 06/07/2013 PCP: [name], MD Brief HPI: HPI:  [name] is an 79 y.o. female with hx of afib, prior CVA with dysphagia, hyperlipidemia, HTN, hypothyroidism, hx of Sz on ACD, resident of assisted living, brought to the ER as she has been feeling weak, unable to eat, and having lightheadedness. She was found in the ER to be in afib with RVR. She was given IV diltizem which immediately control her rate. Further evaluation included a CXR which showed patchy infiltrate in the right base and evidence of COPD. She has a leukocytosis with a WBC of 13K, and normal serology to included normal LFTs, electrolytes, and normal renal fx tests. She was started on IV Rocephin and iV zithromax, and hospitalist was asked to admit her for CAP, afib with RVR, lightheadedness and generalized weakness.   Assessment/Plan: 1. Atrial fib: rate controlled with one dose of cardizem. Resume xarelto.   2. CAP: admitte to telemetry. Continue with rocephin and zithromax.   3. Generalized weakness: possibly from the pneumonia.   4. LEUKOCYTOSIS: possibly form the CAP.   5. DVT prophylaxis.   Code Status: full code Family Communication: none at bedside Disposition Plan: pending PT eval.    Consultants:  none  Procedures:  none  Antibiotics:  Rocephin   zithromax  HPI/Subjective: NO NEW COMPLAINTS.   Objective: Filed Vitals:   06/08/13 0616  BP: 101/41  Pulse: 79  Temp:   Resp: 16    Intake/Output Summary (Last 24 hours) at 06/08/13 1250 Last data filed at 06/08/13 0645  Gross per 24 hour  Intake 548.83 ml  Output      0 ml  Net 548.83 ml   Filed Weights   06/07/13 2340 06/08/13 0456  Weight: 45.088 kg (99 lb 6.4 oz) 45.088 kg (99 lb 6.4 oz)    Exam:   General:  Alert afebrile comfortable  Cardiovascular: s1s2  Respiratory: ctab  Abdomen: soft NT ND BS+   Data Reviewed: Basic  Metabolic Panel:  Recent Labs Lab 06/07/13 2030  NA 139  K 3.5  CL 102  CO2 27  GLUCOSE 139*  BUN 18  CREATININE 0.75  CALCIUM 9.0   Liver Function Tests:  Recent Labs Lab 06/07/13 2030  AST 22  ALT 19  ALKPHOS 64  BILITOT 0.7  PROT 6.7  ALBUMIN 3.4*   No results found for this basename: LIPASE, AMYLASE,  in the last 168 hours No results found for this basename: AMMONIA,  in the last 168 hours CBC:  Recent Labs Lab 06/07/13 2030  WBC 13.0*  NEUTROABS 11.5*  HGB 12.6  HCT 35.7*  MCV 91.3  PLT 169   Cardiac Enzymes:  Recent Labs Lab 06/07/13 2143  TROPONINI <0.30   BNP (last 3 results) No results found for this basename: PROBNP,  in the last 8760 hours CBG: No results found for this basename: GLUCAP,  in the last 168 hours  No results found for this or any previous visit (from the past 240 hour(s)).   Studies: Dg Chest 2 View  06/07/2013   CLINICAL DATA:  Fatigue and hypertension  EXAM: CHEST  2 VIEW  COMPARISON:  Feb 05, 2013  FINDINGS: There is patchy infiltrate in the right base. There is underlying emphysema. Heart size is normal. Pulmonary vascularity reflects underlying emphysema.  There is marked pectus excavatum. No pneumothorax. No adenopathy. There is atherosclerotic change in the aorta.  IMPRESSION: Patchy infiltrate  right base. Underlying emphysema. Pectus excavatum.   Electronically Signed   By: Bretta Bang   On: 06/07/2013 20:16   Ct Head Wo Contrast  06/07/2013   CLINICAL DATA:  Lethargy, shallow breathing, recent discharge for fall.  EXAM: CT HEAD WITHOUT CONTRAST  TECHNIQUE: Contiguous axial images were obtained from the base of the skull through the vertex without intravenous contrast.  COMPARISON:  MRI brain dated 05/31/2013.  FINDINGS: No evidence of parenchymal hemorrhage or extra-axial fluid collection. No mass lesion, mass effect, or midline shift.  No CT evidence of acute infarction.  Encephalomalacic changes related to remote  right MCA distribution infarct.  Subcortical white matter and periventricular small vessel ischemic changes. Intracranial atherosclerosis.  Global cortical atrophy with prominence of the bifrontal extra-axial CSF spaces. No ventriculomegaly.  The visualized paranasal sinuses are essentially clear. The mastoid air cells are unopacified.  No evidence of calvarial fracture.  IMPRESSION: No evidence of acute intracranial abnormality.  Encephalomalacic changes related to prior right MCA distribution infarct.  Atrophy with small vessel ischemic changes and intracranial atherosclerosis.   Electronically Signed   By: Charline Bills M.D.   On: 06/07/2013 20:27    Scheduled Meds: . acetaminophen  1,000 mg Oral Once  . ALPRAZolam  0.5 mg Oral BID  . atorvastatin  10 mg Oral Daily  . azithromycin  500 mg Intravenous QHS  . calcium-vitamin D  1 tablet Oral QHS  . cefTRIAXone (ROCEPHIN)  IV  1 g Intravenous QHS  . digoxin  0.125 mg Oral Daily  . docusate sodium  100 mg Oral QHS  . escitalopram  5 mg Oral Daily  . levETIRAcetam  500 mg Oral BID  . levothyroxine  50 mcg Oral QAC breakfast  . mirabegron ER  25 mg Oral Daily  . polyethylene glycol  17 g Oral Daily  . Rivaroxaban  15 mg Oral QAC supper  . sodium chloride  3 mL Intravenous Q12H   Continuous Infusions: . dextrose 5 % and 0.9% NaCl 50 mL/hr at 06/08/13 0038    Principal Problem:   CAP (community acquired pneumonia) Active Problems:   Atrial fibrillation   Malnutrition   History of CVA (cerebrovascular accident)   Fall   Hypothyroidism   HTN (hypertension)   History of DVT (deep vein thrombosis)    Time spent:     [facility]  [facility] Pager [phone]. If 7PM-7AM, please contact night-coverage at [email or web address], password [facility] 06/08/2013, 12:50 PM  LOS: 1 day

## 2013-06-08 NOTE — ED Provider Notes (Signed)
I saw and evaluated the patient, reviewed the resident's note and I agree with the findings and plan. Agree with EKG interpretation if present.   Pt with increasing weakness, multi-factorial, unable to stand or participate in ADLs. Sleeping comfortably on my eval. Vitals and labs reviewed. Admit for further eval.   Charles B. Bernette Mayers, MD 06/08/13 3664

## 2013-06-08 NOTE — Progress Notes (Signed)
INITIAL NUTRITION ASSESSMENT  DOCUMENTATION CODES Per approved criteria  -Underweight   INTERVENTION:  Ensure Complete PO BID, each supplement provides 350 kcal and 13 grams of protein.  NUTRITION DIAGNOSIS: Underweight related to suspected inadequate oral intake as evidenced by BMI=15.1.   Goal: Intake to meet >90% of estimated nutrition needs.  Monitor:  PO intake, labs, weight trend.  Reason for Assessment: MST=2  79 y.o. female  Admitting Dx: CAP (community acquired pneumonia)  ASSESSMENT: Patient brought to the ER from ALF as she has been feeling weak, unable to eat, and having lightheadedness. She was found in the ER to be in afib with RVR. Patient is underweight with BMI=15.1. Unable to speak with patient at this time as she is working with PT. Suspect patient with severe PCM. PO intake not documented.   Height: Ht Readings from Last 1 Encounters:  06/07/13 5\' 8"  (1.727 m)    Weight: Wt Readings from Last 1 Encounters:  06/08/13 99 lb 6.4 oz (45.088 kg)    Ideal Body Weight: 63.6 kg  % Ideal Body Weight: 71%  Wt Readings from Last 10 Encounters:  06/08/13 99 lb 6.4 oz (45.088 kg)  05/10/13 96 lb 12.8 oz (43.908 kg)  01/10/13 91 lb 6.4 oz (41.459 kg)  01/03/13 95 lb 6.4 oz (43.273 kg)  12/10/12 95 lb 6.4 oz (43.273 kg)  10/12/12 102 lb (46.267 kg)  09/27/12 123 lb 7.3 oz (56 kg)  09/27/12 123 lb 7.3 oz (56 kg)  04/26/12 106 lb 6.4 oz (48.263 kg)  11/16/11 110 lb (49.896 kg)    Usual Body Weight: 123 lb (8 Months ago)  % Usual Body Weight: 80%  BMI:  Body mass index is 15.12 kg/(m^2).  Estimated Nutritional Needs: Kcal: 1500-1700 Protein: 70-80 gm Fluid: 1.5-1.7 L  Skin: stage 1 sacral pressure ulcer; skin tear on left elbow  Diet Order: Dysphagia 3 with thin liquids  EDUCATION NEEDS: -Education not appropriate at this time   Intake/Output Summary (Last 24 hours) at 06/08/13 1440 Last data filed at 06/08/13 0645  Gross per 24 hour   Intake 548.83 ml  Output      0 ml  Net 548.83 ml    Last BM: 9/20   Labs:   Recent Labs Lab 06/07/13 2030  NA 139  K 3.5  CL 102  CO2 27  BUN 18  CREATININE 0.75  CALCIUM 9.0  GLUCOSE 139*    CBG (last 3)  No results found for this basename: GLUCAP,  in the last 72 hours  Scheduled Meds: . acetaminophen  1,000 mg Oral Once  . ALPRAZolam  0.5 mg Oral BID  . atorvastatin  10 mg Oral Daily  . calcium-vitamin D  1 tablet Oral QHS  . digoxin  0.125 mg Oral Daily  . docusate sodium  100 mg Oral QHS  . escitalopram  5 mg Oral Daily  . levETIRAcetam  500 mg Oral BID  . levofloxacin  750 mg Oral Q48H  . levothyroxine  50 mcg Oral QAC breakfast  . mirabegron ER  25 mg Oral Daily  . polyethylene glycol  17 g Oral Daily  . Rivaroxaban  15 mg Oral QAC supper  . sodium chloride  3 mL Intravenous Q12H    Continuous Infusions: . dextrose 5 % and 0.9% NaCl 50 mL/hr at 06/08/13 0038    Past Medical History  Diagnosis Date  . Dysphagia   . Atrial fibrillation     a. Dx 05/2011 at time of  CVA.  . Hyperlipidemia   . HTN (hypertension)   . Hypothyroidism   . Pneumonia     "alot of pneumonia" (09/21/2012)  . Syncope and collapse     "fell @ [place]; hit left forehead"; left hip fx/notes 09/20/2012  . Vertigo   . Arthritis     "some; all over" (09/22/2011)  . Anxiety   . Depression   . Seizures      a. 05/2011 right MCA CVA in setting of new onset afib s/p thrombolysis/thrombolytics. b. Recurrent CVA 10/2011 in setting of subtherap.INR.  "only one she ever had was 3 months after her stroke" (09/21/2012  . Seizure 10/2011  . Stroke 05/2011    a. 05/2011 right MCA CVA in setting of new onset afib s/p thrombolysis/thrombolytics. b. Recurrent CVA 10/2011 in setting of subtherap.INR.  . DVT of lower extremity (deep venous thrombosis) 01/02/2013    LLE    Past Surgical History  Procedure Laterality Date  . Lumbar disc surgery  1981  . Vaginal hysterectomy  1982  . Pleural  scarification  1977?; 1986    "2 lung surgeries; my lungs had collapsed" (09/21/2012)  . Tonsillectomy and adenoidectomy  1950?  Marland Kitchen Appendectomy  1982  . Thrombolysis  05/2011    [name] 09/20/2012  . Hip arthroplasty  09/21/2012    Procedure: ARTHROPLASTY BIPOLAR HIP;  Surgeon: Kerrin Champagne, MD;  Location: [facility] OR;  Service: Orthopedics;  Laterality: Left;  . Joint replacement      Joaquin Courts, RD, LDN, CNSC Pager [phone] After Hours Pager [phone]

## 2013-06-09 LAB — CBC WITH DIFFERENTIAL/PLATELET
Basophils Absolute: 0 10*3/uL (ref 0.0–0.1)
Basophils Relative: 0 % (ref 0–1)
Eosinophils Absolute: 0.1 10*3/uL (ref 0.0–0.7)
HCT: 31 % — ABNORMAL LOW (ref 36.0–46.0)
Hemoglobin: 10.8 g/dL — ABNORMAL LOW (ref 12.0–15.0)
MCHC: 34.8 g/dL (ref 30.0–36.0)
Monocytes Absolute: 0.9 10*3/uL (ref 0.1–1.0)
Monocytes Relative: 12 % (ref 3–12)
Neutro Abs: 5.8 10*3/uL (ref 1.7–7.7)
Neutrophils Relative %: 73 % (ref 43–77)
WBC: 7.9 10*3/uL (ref 4.0–10.5)

## 2013-06-09 LAB — BASIC METABOLIC PANEL
BUN: 12 mg/dL (ref 6–23)
Chloride: 103 mEq/L (ref 96–112)
GFR calc Af Amer: >90 mL/min (ref >90)
Potassium: 3 mEq/L — ABNORMAL LOW (ref 3.5–5.1)

## 2013-06-09 MED ORDER — POTASSIUM CHLORIDE CRYS ER 20 MEQ PO TBCR
40.0000 meq | EXTENDED_RELEASE_TABLET | Freq: Once | ORAL | Status: AC
  Administered 2013-06-09: 40 meq via ORAL
  Filled 2013-06-09: qty 2

## 2013-06-09 MED ORDER — POTASSIUM CHLORIDE 20 MEQ PO PACK: 40.0000 meq | PACK | Freq: Once | ORAL | Status: DC

## 2013-06-09 MED ORDER — LEVOFLOXACIN 750 MG PO TABS: 750.0000 mg | ORAL_TABLET | ORAL | Status: DC

## 2013-06-09 NOTE — Progress Notes (Signed)
   CARE MANAGEMENT NOTE 06/09/2013  Patient:  DERIYAH, KUNATH   Account Number:  [id number]  Date Initiated:  06/09/2013  Documentation initiated by:  [facility]  Subjective/Objective Assessment:   adm: lightheaded, weak     Action/Plan:   discharge planning   Anticipated DC Date:  06/09/2013   Anticipated DC Plan:  HOME W HOME HEALTH SERVICES      DC Planning Services  CM consult      [facility] Choice  HOME HEALTH   Choice offered to / List presented to:  C-1 Patient        HH arranged  HH-2 PT  HH-3 OT  HH-4 NURSE'S AIDE      HH agency  [facility]   Status of service:  Completed, signed off Medicare Important Message given?   (If response is "NO", the following Medicare IM given date fields will be blank) Date Medicare IM given:   Date Additional Medicare IM given:    Discharge Disposition:  HOME W HOME HEALTH SERVICES  Per UR Regulation:    If discussed at Long Length of Stay Meetings, dates discussed:    Comments:  06/09/13 14:42 CM met with pt in room to offer choice.  [facility] was chosen for HHPT/OT/aide.  Pt lives with husband at [facility].  Pt and pt's spouse refuse SNF as recc.  Address and contact numbers verified for [facility] services.  Referral faxed to [facility].  No other CM needs were communicated.  Freddy Jaksch, BSN, CM [phone].

## 2013-06-09 NOTE — Discharge Summary (Signed)
Physician Discharge Summary  [name] ZOX:[id number] DOB: [birth year] DOA: 06/07/2013  PCP: [name], MD  Admit date: 06/07/2013 Discharge date: 06/09/2013  Time spent: 35 minutes  Recommendations for Outpatient Follow-up:  1. Follow up with PCP in 1-2 weeks  Discharge Diagnoses:  Principal Problem:   CAP (community acquired pneumonia) Active Problems:   Atrial fibrillation   Malnutrition   History of CVA (cerebrovascular accident)   Fall   Hypothyroidism   HTN (hypertension)   History of DVT (deep vein thrombosis)   Discharge Condition: Improved  Diet recommendation: Dysphagia 3, with thin liquids  Filed Weights   06/07/13 2340 06/08/13 0456  Weight: 45.088 kg (99 lb 6.4 oz) 45.088 kg (99 lb 6.4 oz)    History of present illness:  [name] is an 79 y.o. female with hx of afib, prior CVA with dysphagia, hyperlipidemia, HTN, hypothyroidism, hx of Sz on ACD, resident of assisted living, brought to the ER as she has been feeling weak, unable to eat, and having lightheadedness. She was found in the ER to be in afib with RVR. She was given IV diltizem which immediately control her rate. Further evaluation included a CXR which showed patchy infiltrate in the right base and evidence of COPD. She has a leukocytosis with a WBC of 13K, and normal serology to included normal LFTs, electrolytes, and normal renal fx tests. She was started on IV Rocephin and iV zithromax, and hospitalist was asked to admit her for CAP, afib with RVR, lightheadedness and generalized weakness.   Hospital Course:  The patient was admitted to the floor. The patient's rate was controlled after one dose of IV Cardizem and has remained stable throughout the remainder of her hospital stay. The patient was initially continued on rocephin with azithromycin. The patient remained afebrile with no leukocytosis. The patient's antibiotics were later converted to PO levaquin on 9/20. She remained afebrile  over night without leukocytosis. The patient reports feeling much improved. The patient is medically stable for close outpatient follow up.  Discharge Exam: Filed Vitals:   06/08/13 0456 06/08/13 0616 06/08/13 1400 06/09/13 0634  BP: 89/44 101/41 101/54 124/77  Pulse: 74 79 98 93  Temp: 97.8 F (36.6 C)  97.9 F (36.6 C) 97.7 F (36.5 C)  TempSrc: Oral  Oral Oral  Resp: 18 16 18 18   Height:      Weight: 45.088 kg (99 lb 6.4 oz)     SpO2: 99%  98% 96%    General: Awake, in nad Cardiovascular: irregularly irregular, s1, s2 Respiratory: normal resp effort, no wheezing  Discharge Instructions     Medication List         ALPRAZolam 0.5 MG tablet  Commonly known as:  XANAX  Take 0.5 mg by mouth 2 (two) times daily.     atorvastatin 10 MG tablet  Commonly known as:  LIPITOR  Take 10 mg by mouth daily.     CALTRATE 600+D 600-400 MG-UNIT per tablet  Generic drug:  Calcium Carbonate-Vitamin D  Take 1 tablet by mouth at bedtime.     digoxin 0.125 MG tablet  Commonly known as:  LANOXIN  Take 0.125 mg by mouth daily.     docusate sodium 100 MG capsule  Commonly known as:  COLACE  Take 100 mg by mouth at bedtime.     escitalopram 5 MG tablet  Commonly known as:  LEXAPRO  Take 5 mg by mouth daily.     levETIRAcetam 500 MG tablet  Commonly known as:  KEPPRA  Take 500 mg by mouth 2 (two) times daily.     levofloxacin 750 MG tablet  Commonly known as:  LEVAQUIN  Take 1 tablet (750 mg total) by mouth every other day.     levothyroxine 50 MCG tablet  Commonly known as:  SYNTHROID, LEVOTHROID  Take 50 mcg by mouth daily.     mirabegron ER 25 MG Tb24 tablet  Commonly known as:  MYRBETRIQ  Take 25 mg by mouth daily.     multivitamin with minerals Tabs tablet  Take 1 tablet by mouth daily.     OVER THE COUNTER MEDICATION  Apply 1 application topically daily. Cream for sores on tailbone     polyethylene glycol packet  Commonly known as:  MIRALAX / GLYCOLAX  Take 17  g by mouth daily.     Rivaroxaban 15 MG Tabs tablet  Commonly known as:  XARELTO  Take 1 tablet (15 mg total) by mouth 2 (two) times daily.       No Known Allergies Follow-up Information   Follow up with [name], MD. Schedule an appointment as soon as possible for a visit in 1 week.   Specialty:  Internal Medicine   Contact information:   [address] [facility] [place] [address] [phone]        The results of significant diagnostics from this hospitalization (including imaging, microbiology, ancillary and laboratory) are listed below for reference.    Significant Diagnostic Studies: Dg Chest 2 View  06/07/2013   CLINICAL DATA:  Fatigue and hypertension  EXAM: CHEST  2 VIEW  COMPARISON:  Feb 05, 2013  FINDINGS: There is patchy infiltrate in the right base. There is underlying emphysema. Heart size is normal. Pulmonary vascularity reflects underlying emphysema.  There is marked pectus excavatum. No pneumothorax. No adenopathy. There is atherosclerotic change in the aorta.  IMPRESSION: Patchy infiltrate right base. Underlying emphysema. Pectus excavatum.   Electronically Signed   By: Bretta Bang   On: 06/07/2013 20:16   Ct Head Wo Contrast  06/07/2013   CLINICAL DATA:  Lethargy, shallow breathing, recent discharge for fall.  EXAM: CT HEAD WITHOUT CONTRAST  TECHNIQUE: Contiguous axial images were obtained from the base of the skull through the vertex without intravenous contrast.  COMPARISON:  MRI brain dated 05/31/2013.  FINDINGS: No evidence of parenchymal hemorrhage or extra-axial fluid collection. No mass lesion, mass effect, or midline shift.  No CT evidence of acute infarction.  Encephalomalacic changes related to remote right MCA distribution infarct.  Subcortical white matter and periventricular small vessel ischemic changes. Intracranial atherosclerosis.  Global cortical atrophy with prominence of the bifrontal extra-axial CSF  spaces. No ventriculomegaly.  The visualized paranasal sinuses are essentially clear. The mastoid air cells are unopacified.  No evidence of calvarial fracture.  IMPRESSION: No evidence of acute intracranial abnormality.  Encephalomalacic changes related to prior right MCA distribution infarct.  Atrophy with small vessel ischemic changes and intracranial atherosclerosis.   Electronically Signed   By: Charline Bills M.D.   On: 06/07/2013 20:27   Ct Head Wo Contrast  05/31/2013   **ADDENDUM** CREATED: 05/31/2013 16:57:44  Results discussed with Dr. Leroy Kennedy 05/31/2013 4:50 p.m.  **END ADDENDUM** SIGNED BY: Almedia Balls. Constance Goltz, M.D.  05/31/2013   *RADIOLOGY REPORT*  Clinical Data: Left-sided weakness.  CT HEAD WITHOUT CONTRAST  Technique:  Contiguous axial images were obtained from the base of the skull through the vertex without contrast.  Comparison: 02/05/2013.  Findings: Series 2 image 20 raises possibility of small acute left frontal lobe infarct.  It is possible this represents partial volume averaging with a sulcus although different than on the prior exam therefore infarct is of concern.  No intracranial hemorrhage.  Remote large right hemispheric infarct with encephalomalacia right temporal lobe, opercular region and portion of the right parietal lobe.  Subsequent mild dilation of the right lateral ventricle.  No hydrocephalus.  Within the right pons (series 9 image 32), hypodensity is noted. This may represent artifact although limited for detecting small acute infarct.  No intracranial mass lesion detected on this unenhanced exam.  Mastoid air cells, middle ear cavities and visualized sinuses are clear.  Orbital structures unremarkable.  IMPRESSION: Question small acute non hemorrhagic infarct left frontal lobe.  Remote large right hemispheric infarct.  Probable streak artifact right pons limiting evaluation for detection of small acute infarct at this level.  Global atrophy without hydrocephalus.  Changes  in to Dr. Leroy Kennedy as this is a code stroke.  Original Report Authenticated By: Lacy Duverney, M.D.   Ct Cervical Spine Wo Contrast  05/31/2013   CLINICAL DATA:  Fall, laceration  EXAM: CT CERVICAL SPINE WITHOUT CONTRAST  TECHNIQUE: Multidetector CT imaging of the cervical spine was performed without intravenous contrast. Multiplanar CT image reconstructions were also generated.  COMPARISON:  09/20/2012  FINDINGS: Axial images of cervical spine shows no acute fracture or subluxation. Again noted right apical pleural thickening and surgical clips in right apex. Diffuse osteopenia.  Computer processed images shows no acute fracture or subluxation. Mild degenerative changes C1-C2 articulation. Again noted mild anterior spurring lower endplate of C3-C4 and C5 vertebral body. There is mild disc space flattening with mild anterior and mild posterior spurring at C5-C6 and C6-C7 level. No prevertebral soft tissue swelling. Cervical airway is patent.  IMPRESSION: No acute fracture or subluxation. Degenerative changes as described above.   Electronically Signed   By: Natasha Mead   On: 05/31/2013 19:15   Mr Brain Wo Contrast  05/31/2013   CLINICAL DATA:  Code stroke. Fall today. Abnormal CT of the head.  EXAM: MRI HEAD WITHOUT CONTRAST  TECHNIQUE: Multiplanar, multisequence MR imaging was performed. No intravenous contrast was administered.  COMPARISON:  CT head of the same day. MRI brain 01/02/2013.  FINDINGS: The diffusion-weighted images demonstrate no evidence for acute or subacute infarction. A remote right MCA territory infarct is stable. Mild ex vacuo dilation is evident. Mild generalized atrophy and left-sided white matter disease is otherwise within normal limits for age.  Flow is present in the major intracranial arteries. The globes and orbits are intact. The paranasal sinuses and mastoid air cells are clear. Mild leftward delete that leftward nasal septal spurring is stable.  IMPRESSION: 1. No acute intracranial  abnormality. 2. Remote right MCA territory infarct is stable.   Electronically Signed   By: Gennette Pac   On: 05/31/2013 20:20    Microbiology: Recent Results (from the past 240 hour(s))  CULTURE, BLOOD (ROUTINE X 2)     Status: None   Collection Time    06/07/13  9:45 PM      Result Value Range Status   Specimen Description BLOOD ARM LEFT   Final   Special Requests BOTTLES DRAWN AEROBIC AND ANAEROBIC 10CC   Final   Culture  Setup Time     Final   Value: 06/08/2013 03:36     Performed at [facility]   Culture     Final  Value:        BLOOD CULTURE RECEIVED NO GROWTH TO DATE CULTURE WILL BE HELD FOR 5 DAYS BEFORE ISSUING A FINAL NEGATIVE REPORT     Performed at [facility]   Report Status PENDING   Incomplete  CULTURE, BLOOD (ROUTINE X 2)     Status: None   Collection Time    06/07/13  9:50 PM      Result Value Range Status   Specimen Description BLOOD HAND LEFT   Final   Special Requests BOTTLES DRAWN AEROBIC ONLY 10CC   Final   Culture  Setup Time     Final   Value: 06/08/2013 03:36     Performed at [facility]   Culture     Final   Value:        BLOOD CULTURE RECEIVED NO GROWTH TO DATE CULTURE WILL BE HELD FOR 5 DAYS BEFORE ISSUING A FINAL NEGATIVE REPORT     Performed at [facility]   Report Status PENDING   Incomplete     Labs: Basic Metabolic Panel:  Recent Labs Lab 06/07/13 2030 06/09/13 0600  NA 139 135  K 3.5 3.0*  CL 102 103  CO2 27 24  GLUCOSE 139* 95  BUN 18 12  CREATININE 0.75 0.58  CALCIUM 9.0 8.7   Liver Function Tests:  Recent Labs Lab 06/07/13 2030  AST 22  ALT 19  ALKPHOS 64  BILITOT 0.7  PROT 6.7  ALBUMIN 3.4*   No results found for this basename: LIPASE, AMYLASE,  in the last 168 hours No results found for this basename: AMMONIA,  in the last 168 hours CBC:  Recent Labs Lab 06/07/13 2030 06/09/13 0600  WBC 13.0* 7.9  NEUTROABS 11.5* 5.8  HGB 12.6 10.8*  HCT 35.7* 31.0*  MCV 91.3 90.9   PLT 169 164   Cardiac Enzymes:  Recent Labs Lab 06/07/13 2143  TROPONINI <0.30   BNP: BNP (last 3 results) No results found for this basename: PROBNP,  in the last 8760 hours CBG: No results found for this basename: GLUCAP,  in the last 168 hours  Signed:  Micheal Sheen K  [facility] 06/09/2013, 12:26 PM

## 2013-06-10 NOTE — Progress Notes (Signed)
Utilization review completed. Haeleigh Streiff RN CCM Case Mgmt  

## 2013-06-14 ENCOUNTER — Encounter (HOSPITAL_COMMUNITY): Payer: Self-pay | Admitting: Emergency Medicine

## 2013-06-14 ENCOUNTER — Observation Stay (HOSPITAL_COMMUNITY)
Admission: EM | Admit: 2013-06-14 | Discharge: 2013-06-16 | Disposition: A | Discharge status: Skilled Nursing Facility | Payer: Medicare Other | Attending: Internal Medicine | Admitting: Internal Medicine

## 2013-06-14 ENCOUNTER — Emergency Department (HOSPITAL_COMMUNITY): Payer: Medicare Other

## 2013-06-14 DIAGNOSIS — W19XXXD Unspecified fall, subsequent encounter: Secondary | ICD-10-CM

## 2013-06-14 DIAGNOSIS — Z8673 Personal history of transient ischemic attack (TIA), and cerebral infarction without residual deficits: Secondary | ICD-10-CM | POA: Insufficient documentation

## 2013-06-14 DIAGNOSIS — Z23 Encounter for immunization: Secondary | ICD-10-CM | POA: Insufficient documentation

## 2013-06-14 DIAGNOSIS — W19XXXA Unspecified fall, initial encounter: Secondary | ICD-10-CM

## 2013-06-14 DIAGNOSIS — J189 Pneumonia, unspecified organism: Secondary | ICD-10-CM

## 2013-06-14 DIAGNOSIS — I10 Essential (primary) hypertension: Secondary | ICD-10-CM | POA: Insufficient documentation

## 2013-06-14 DIAGNOSIS — R5381 Other malaise: Secondary | ICD-10-CM | POA: Insufficient documentation

## 2013-06-14 DIAGNOSIS — Z022 Encounter for examination for admission to residential institution: Secondary | ICD-10-CM

## 2013-06-14 DIAGNOSIS — E46 Unspecified protein-calorie malnutrition: Secondary | ICD-10-CM

## 2013-06-14 DIAGNOSIS — R531 Weakness: Secondary | ICD-10-CM

## 2013-06-14 DIAGNOSIS — E43 Unspecified severe protein-calorie malnutrition: Secondary | ICD-10-CM | POA: Insufficient documentation

## 2013-06-14 DIAGNOSIS — R627 Adult failure to thrive: Secondary | ICD-10-CM | POA: Diagnosis present

## 2013-06-14 DIAGNOSIS — Z9181 History of falling: Secondary | ICD-10-CM | POA: Insufficient documentation

## 2013-06-14 DIAGNOSIS — Z0289 Encounter for other administrative examinations: Secondary | ICD-10-CM

## 2013-06-14 DIAGNOSIS — Z681 Body mass index (BMI) 19 or less, adult: Secondary | ICD-10-CM | POA: Insufficient documentation

## 2013-06-14 DIAGNOSIS — R9409 Abnormal results of other function studies of central nervous system: Secondary | ICD-10-CM | POA: Insufficient documentation

## 2013-06-14 DIAGNOSIS — Y92009 Unspecified place in unspecified non-institutional (private) residence as the place of occurrence of the external cause: Secondary | ICD-10-CM | POA: Insufficient documentation

## 2013-06-14 DIAGNOSIS — I4891 Unspecified atrial fibrillation: Secondary | ICD-10-CM

## 2013-06-14 DIAGNOSIS — Z7901 Long term (current) use of anticoagulants: Secondary | ICD-10-CM | POA: Insufficient documentation

## 2013-06-14 DIAGNOSIS — D649 Anemia, unspecified: Secondary | ICD-10-CM

## 2013-06-14 DIAGNOSIS — Z79899 Other long term (current) drug therapy: Secondary | ICD-10-CM | POA: Insufficient documentation

## 2013-06-14 LAB — CULTURE, BLOOD (ROUTINE X 2): Culture: NO GROWTH

## 2013-06-14 LAB — POCT I-STAT, CHEM 8
BUN: 12 mg/dL (ref 6–23)
Creatinine, Ser: 0.7 mg/dL (ref 0.50–1.10)
Hemoglobin: 11.2 g/dL — ABNORMAL LOW (ref 12.0–15.0)
Potassium: 3.6 mEq/L (ref 3.5–5.1)
Sodium: 140 mEq/L (ref 135–145)

## 2013-06-14 LAB — CBC WITH DIFFERENTIAL/PLATELET
Basophils Absolute: 0 10*3/uL (ref 0.0–0.1)
Hemoglobin: 11.4 g/dL — ABNORMAL LOW (ref 12.0–15.0)
Lymphocytes Relative: 21 % (ref 12–46)
Lymphs Abs: 1.4 10*3/uL (ref 0.7–4.0)
Monocytes Relative: 11 % (ref 3–12)
Neutro Abs: 4.2 10*3/uL (ref 1.7–7.7)
Neutrophils Relative %: 66 % (ref 43–77)
Platelets: 247 10*3/uL (ref 150–400)
RBC: 3.63 MIL/uL — ABNORMAL LOW (ref 3.87–5.11)
RDW: 13 % (ref 11.5–15.5)
WBC: 6.4 10*3/uL (ref 4.0–10.5)

## 2013-06-14 LAB — URINALYSIS, ROUTINE W REFLEX MICROSCOPIC
Glucose, UA: NEGATIVE mg/dL
Hgb urine dipstick: NEGATIVE
Leukocytes, UA: NEGATIVE
Specific Gravity, Urine: 1.013 (ref 1.005–1.030)

## 2013-06-14 NOTE — H&P (Signed)
PCP:   [name], MD   Chief Complaint:  ftt  HPI: 79 yo female just d/c from here for pna, lives with husband.  Pt has not been doing well, frequently falling at home and husband can no longer care for her at home.  She denies any pain anywhere after today's fall at home.  She is overall weak.  No fevers.  No sob.  No n/v/d.  Taking her levaquin at home.  No active medical issues, except needs rehab.  Review of Systems:  Positive and negative as per HPI otherwise all other systems are negative  Past Medical History: Past Medical History  Diagnosis Date  . Dysphagia   . Atrial fibrillation     a. Dx 05/2011 at time of CVA.  Marland Kitchen Hyperlipidemia   . HTN (hypertension)   . Hypothyroidism   . Pneumonia     "alot of pneumonia" (09/21/2012)  . Syncope and collapse     "fell @ [place]; hit left forehead"; left hip fx/notes 09/20/2012  . Vertigo   . Arthritis     "some; all over" (09/22/2011)  . Anxiety   . Depression   . Seizures      a. 05/2011 right MCA CVA in setting of new onset afib s/p thrombolysis/thrombolytics. b. Recurrent CVA 10/2011 in setting of subtherap.INR.  "only one she ever had was 3 months after her stroke" (09/21/2012  . Seizure 10/2011  . Stroke 05/2011    a. 05/2011 right MCA CVA in setting of new onset afib s/p thrombolysis/thrombolytics. b. Recurrent CVA 10/2011 in setting of subtherap.INR.  . DVT of lower extremity (deep venous thrombosis) 01/02/2013    LLE   Past Surgical History  Procedure Laterality Date  . Lumbar disc surgery  1981  . Vaginal hysterectomy  1982  . Pleural scarification  1977?; 1986    "2 lung surgeries; my lungs had collapsed" (09/21/2012)  . Tonsillectomy and adenoidectomy  1950?  Marland Kitchen Appendectomy  1982  . Thrombolysis  05/2011    [name] 09/20/2012  . Hip arthroplasty  09/21/2012    Procedure: ARTHROPLASTY BIPOLAR HIP;  Surgeon: Kerrin Champagne, MD;  Location: [facility] OR;  Service: Orthopedics;  Laterality: Left;  . Joint replacement       Medications: Prior to Admission medications   Medication Sig Start Date End Date Taking? Authorizing Provider  ALPRAZolam Prudy Feeler) 0.5 MG tablet Take 0.5 mg by mouth 2 (two) times daily.    Yes Historical Provider, MD  atorvastatin (LIPITOR) 10 MG tablet Take 10 mg by mouth daily.  11/17/11  Yes Nishant Dhungel, MD  Calcium Carbonate-Vitamin D (CALTRATE 600+D) 600-400 MG-UNIT per tablet Take 1 tablet by mouth at bedtime.    Yes Historical Provider, MD  digoxin (LANOXIN) 0.125 MG tablet Take 0.125 mg by mouth daily.   Yes Historical Provider, MD  docusate sodium (COLACE) 100 MG capsule Take 100 mg by mouth at bedtime.    Yes Historical Provider, MD  escitalopram (LEXAPRO) 5 MG tablet Take 5 mg by mouth daily.   Yes Historical Provider, MD  levETIRAcetam (KEPPRA) 500 MG tablet Take 500 mg by mouth 2 (two) times daily. 11/17/11 11/16/13 Yes Nishant Dhungel, MD  levofloxacin (LEVAQUIN) 750 MG tablet Take 750 mg by mouth every other day. #4 filled - started course 06/13/13   Yes Historical Provider, MD  levothyroxine (SYNTHROID, LEVOTHROID) 50 MCG tablet Take 50 mcg by mouth daily.    Yes Historical Provider, MD  mirabegron ER (MYRBETRIQ) 25 MG TB24 tablet  Take 25 mg by mouth daily.   Yes Historical Provider, MD  Multiple Vitamin (MULTIVITAMIN WITH MINERALS) TABS tablet Take 1 tablet by mouth daily.   Yes Historical Provider, MD  OVER THE COUNTER MEDICATION Apply 1 application topically daily. Cream for sores on tailbone   Yes Historical Provider, MD  polyethylene glycol (MIRALAX / GLYCOLAX) packet Take 17 g by mouth daily.   Yes Historical Provider, MD  Rivaroxaban (XARELTO) 15 MG TABS tablet Take 1 tablet (15 mg total) by mouth 2 (two) times daily. 01/03/13  Yes Rhetta Mura, MD    Allergies:  No Known Allergies  Social History:  reports that she quit smoking about 33 years ago. Her smoking use included Cigarettes. She has a 15 pack-year smoking history. She has never used smokeless  tobacco. She reports that  drinks alcohol. She reports that she does not use illicit drugs.  Family History: Family History  Problem Relation Age of Onset  . CAD Neg Hx     Physical Exam: Filed Vitals:   06/14/13 2000 06/14/13 2115 06/14/13 2145 06/14/13 2215  BP: 123/57 119/58 116/64 109/63  Pulse: 85 85 81 84  Temp:      TempSrc:      Resp:  13 16 15   SpO2: 99% 99% 98% 98%   General appearance: alert, cooperative, cachectic and no distress Head: Normocephalic, without obvious abnormality, atraumatic Eyes: negative Nose: Nares normal. Septum midline. Mucosa normal. No drainage or sinus tenderness. Neck: no JVD and supple, symmetrical, trachea midline Lungs: clear to auscultation bilaterally Heart: regular rate and rhythm, S1, S2 normal, no murmur, click, rub or gallop Abdomen: soft, non-tender; bowel sounds normal; no masses,  no organomegaly Extremities: extremities normal, atraumatic, no cyanosis or edema Pulses: 2+ and symmetric Skin: Skin color, texture, turgor normal. No rashes or lesions Neurologic: Grossly normal    Labs on Admission:   Recent Labs  06/14/13 2232  NA 140  K 3.6  CL 100  GLUCOSE 97  BUN 12  CREATININE 0.70    Recent Labs  06/14/13 1951 06/14/13 2232  WBC 6.4  --   NEUTROABS 4.2  --   HGB 11.4* 11.2*  HCT 32.8* 33.0*  MCV 90.4  --   PLT 247  --     Radiological Exams on Admission: Dg Chest 2 View  06/14/2013   CLINICAL DATA:  Cough, history hypertension  EXAM: CHEST  2 VIEW  COMPARISON:  06/07/2013  FINDINGS: Mild enlargement of cardiac silhouette.  Atherosclerotic calcification of a mildly tortuous thoracic aorta.  Mediastinal contours and pulmonary vascularity normal.  Emphysematous changes with postsurgical changes at right apex.  No acute infiltrate, pleural effusion or pneumothorax.  Skin fold projects over lower right chest.  Diffuse osseous demineralization with pectus excavatum.  IMPRESSION: COPD changes.  Enlargement of  cardiac silhouette.  No acute abnormalities.   Electronically Signed   By: Ulyses Southward M.D.   On: 06/14/2013 20:55    Assessment/Plan  79 yo female with recent hospitalization for pna who needs rehab placement Principal Problem:   Encounter for examination for admission to nursing home Active Problems:   Fall   FTT (failure to thrive) in adult  obs for placement.  Cont home meds.  Place on med bed.    Aaima Gaddie A 06/14/2013, 11:42 PM

## 2013-06-14 NOTE — ED Provider Notes (Addendum)
CSN: [id number]     Arrival date & time 06/14/13  1905 History   First MD Initiated Contact with Patient 06/14/13 1925     Chief Complaint  Patient presents with  . Fall   (Consider location/radiation/quality/duration/timing/severity/associated sxs/prior Treatment) HPI Comments: Pt recently hospitalized and d/ced 3 days ago after ddx of CAP and husband states he did not realize she needed to be on abx and filled them yesterday and started levaquin yesterday.  However husband states that she's been exceedingly weak and he was trying to get her from her chair to the bed side toilet and she collapsed on him. She did not hit her head or have loss of consciousness but did fall to her left side. She currently does not complaining of any pain. She does take several toes. Husband states that they have the caregiver that comes in certain hours of the day but he is here with her alone in the evenings and he cannot take care of her because of her diffuse weakness. She complains of persistent cough and trouble sleeping but does admit to eating and drinking.  Patient is a 79 y.o. female presenting with fall. The history is provided by the patient and the spouse.  Fall This is a new problem. The current episode started less than 1 hour ago. The problem occurs constantly. The problem has not changed since onset.Pertinent negatives include no chest pain and no shortness of breath. Associated symptoms comments: Generalized weakness. Exacerbated by: activity. Nothing relieves the symptoms. She has tried rest for the symptoms. The treatment provided no relief.    Past Medical History  Diagnosis Date  . Dysphagia   . Atrial fibrillation     a. Dx 05/2011 at time of CVA.  Marland Kitchen Hyperlipidemia   . HTN (hypertension)   . Hypothyroidism   . Pneumonia     "alot of pneumonia" (09/21/2012)  . Syncope and collapse     "fell @ [facility]; hit left forehead"; left hip fx/notes 09/20/2012  . Vertigo   . Arthritis     "some;  all over" (09/22/2011)  . Anxiety   . Depression   . Seizures      a. 05/2011 right MCA CVA in setting of new onset afib s/p thrombolysis/thrombolytics. b. Recurrent CVA 10/2011 in setting of subtherap.INR.  "only one she ever had was 3 months after her stroke" (09/21/2012  . Seizure 10/2011  . Stroke 05/2011    a. 05/2011 right MCA CVA in setting of new onset afib s/p thrombolysis/thrombolytics. b. Recurrent CVA 10/2011 in setting of subtherap.INR.  . DVT of lower extremity (deep venous thrombosis) 01/02/2013    LLE   Past Surgical History  Procedure Laterality Date  . Lumbar disc surgery  1981  . Vaginal hysterectomy  1982  . Pleural scarification  1977?; 1986    "2 lung surgeries; my lungs had collapsed" (09/21/2012)  . Tonsillectomy and adenoidectomy  1950?  Marland Kitchen Appendectomy  1982  . Thrombolysis  05/2011    [name] 09/20/2012  . Hip arthroplasty  09/21/2012    Procedure: ARTHROPLASTY BIPOLAR HIP;  Surgeon: Kerrin Champagne, MD;  Location: [facility] OR;  Service: Orthopedics;  Laterality: Left;  . Joint replacement     Family History  Problem Relation Age of Onset  . CAD Neg Hx    History  Substance Use Topics  . Smoking status: Former Smoker -- 0.50 packs/day for 30 years    Types: Cigarettes    Quit date: 11/16/1979  . Smokeless  tobacco: Never Used  . Alcohol Use: 0.0 oz/week     Comment: 01/02/2013 "1-2 glasss of wine/wk when I drink; haven't had any for a long time"   OB History   Grav Para Term Preterm Abortions TAB SAB Ect Mult Living                 Review of Systems  Constitutional: Positive for activity change, appetite change and fatigue.  Respiratory: Positive for cough. Negative for shortness of breath.   Cardiovascular: Negative for chest pain and leg swelling.  All other systems reviewed and are negative.    Allergies  Review of patient's allergies indicates no known allergies.  Home Medications   Current Outpatient Rx  Name  Route  Sig  Dispense  Refill  . ALPRAZolam  (XANAX) 0.5 MG tablet   Oral   Take 0.5 mg by mouth 2 (two) times daily.          Marland Kitchen atorvastatin (LIPITOR) 10 MG tablet   Oral   Take 10 mg by mouth daily.          . Calcium Carbonate-Vitamin D (CALTRATE 600+D) 600-400 MG-UNIT per tablet   Oral   Take 1 tablet by mouth at bedtime.          . digoxin (LANOXIN) 0.125 MG tablet   Oral   Take 0.125 mg by mouth daily.         Marland Kitchen docusate sodium (COLACE) 100 MG capsule   Oral   Take 100 mg by mouth at bedtime.          Marland Kitchen escitalopram (LEXAPRO) 5 MG tablet   Oral   Take 5 mg by mouth daily.         Marland Kitchen levETIRAcetam (KEPPRA) 500 MG tablet   Oral   Take 500 mg by mouth 2 (two) times daily.         Marland Kitchen levofloxacin (LEVAQUIN) 750 MG tablet   Oral   Take 1 tablet (750 mg total) by mouth every other day.           Dispense 7 day supply with no refill   . levothyroxine (SYNTHROID, LEVOTHROID) 50 MCG tablet   Oral   Take 50 mcg by mouth daily.          . mirabegron ER (MYRBETRIQ) 25 MG TB24 tablet   Oral   Take 25 mg by mouth daily.         . Multiple Vitamin (MULTIVITAMIN WITH MINERALS) TABS tablet   Oral   Take 1 tablet by mouth daily.         Marland Kitchen OVER THE COUNTER MEDICATION   Topical   Apply 1 application topically daily. Cream for sores on tailbone         . polyethylene glycol (MIRALAX / GLYCOLAX) packet   Oral   Take 17 g by mouth daily.         . Rivaroxaban (XARELTO) 15 MG TABS tablet   Oral   Take 1 tablet (15 mg total) by mouth 2 (two) times daily.   42 tablet   0    BP 117/55  Pulse 86  Temp(Src) 97.9 F (36.6 C) (Oral)  Resp 14  SpO2 100% Physical Exam  Nursing note and vitals reviewed. Constitutional: She is oriented to person, place, and time. Vital signs are normal. She appears cachectic. She has a sickly appearance. No distress.  HENT:  Head: Normocephalic and atraumatic.  Mouth/Throat: Oropharynx is clear and moist.  Eyes: Conjunctivae and EOM are normal. Pupils are  equal, round, and reactive to light.  Neck: Normal range of motion. Neck supple.  Cardiovascular: Intact distal pulses.  An irregularly irregular rhythm present.  No murmur heard. Pulmonary/Chest: Effort normal and breath sounds normal. No respiratory distress. She has no wheezes. She has no rales.  Abdominal: Soft. She exhibits no distension. There is no tenderness. There is no rebound and no guarding.  Musculoskeletal: Normal range of motion. She exhibits no edema and no tenderness.  Diffuse ecchymosis over the lower ext  Neurological: She is alert and oriented to person, place, and time.  Skin: Skin is warm and dry. No rash noted. No erythema.  Psychiatric: She has a normal mood and affect. Her behavior is normal.    ED Course  Procedures (including critical care time) Labs Review Labs Reviewed  CBC WITH DIFFERENTIAL - Abnormal; Notable for the following:    RBC 3.63 (*)    Hemoglobin 11.4 (*)    HCT 32.8 (*)    All other components within normal limits  URINALYSIS, ROUTINE W REFLEX MICROSCOPIC - Abnormal; Notable for the following:    APPearance CLOUDY (*)    pH 8.5 (*)    All other components within normal limits  POCT I-STAT, CHEM 8 - Abnormal; Notable for the following:    Hemoglobin 11.2 (*)    HCT 33.0 (*)    All other components within normal limits   Imaging Review Dg Chest 2 View  06/14/2013   CLINICAL DATA:  Cough, history hypertension  EXAM: CHEST  2 VIEW  COMPARISON:  06/07/2013  FINDINGS: Mild enlargement of cardiac silhouette.  Atherosclerotic calcification of a mildly tortuous thoracic aorta.  Mediastinal contours and pulmonary vascularity normal.  Emphysematous changes with postsurgical changes at right apex.  No acute infiltrate, pleural effusion or pneumothorax.  Skin fold projects over lower right chest.  Diffuse osseous demineralization with pectus excavatum.  IMPRESSION: COPD changes.  Enlargement of cardiac silhouette.  No acute abnormalities.    Electronically Signed   By: Ulyses Southward M.D.   On: 06/14/2013 20:55     Date: 06/14/2013  Rate: 86  Rhythm: atrial fibrillation  QRS Axis: right  Intervals: normal  ST/T Wave abnormalities: nonspecific ST changes  Conduction Disutrbances:low voltage  Narrative Interpretation:   Old EKG Reviewed: unchanged   MDM   1. Weakness     The patient presented via EMS after collapsing at home on her husband do to diffuse weakness. Patient was just recently discharged from the hospital 3 days 2 after being treated for pneumonia. Husband states he did not realize patient needed antibiotics at home until yesterday and felt the Levaquin and started her on it last night. Patient has been eating and drinking by husband states she is so weak that he cannot take care of her at home. They do have a caregiver that comes in at certain times of the day but is not there 24 hours.  Patient had no LOC or head injury with the fall. She is on Xarelto for A. Fib. Able to fully range bilateral hips, knees and ankles without any pain.  CBC, i-STAT, UA, chest x-ray pending. However patient is unable to be cared for at home and most likely will need rehabilitation to regain her strength so that she can return home safely.  11:13 PM Labs all wnl.  Discussed with [facility] and will attempt to contact case management for rehab placement.  Gwyneth Sprout, MD 06/14/13 [phone]  Gwyneth Sprout, MD 06/14/13 2325

## 2013-06-14 NOTE — ED Notes (Signed)
Per EMS pt was trying to stand up after using the toilet and she fell down onto the ground from a standing position. Per EMS pt states she did not hit her head but she did land on her left side, pt c/o of left knee pain. Pt had a stroke one year ago and has left sided deficits that resulted from the stroke, pt is currently enrolled in a physical therapy program and has been working on regaining her strength on the left side. Pt was discharged from the hospital four days ago as she was admitted for pneumonia, pt states she has felt weak and not herself for the past three weeks. Pt's BP was 110/70, RR 16, 99% RA, HR 100.

## 2013-06-14 NOTE — ED Notes (Signed)
Pt's husband states he was trying to help get the pt off of the toilet when he fell over and the pt slid down to the ground when he fell, pt's husband states the pt is so weak she was unable to get up off the floor and he was not strong enough to get her up off the ground, per pt's husband, pt has been extremely weak since she was dx with pneumonia. Pt states she was able to take a few steps two days ago but she has been so weak she has not been able to get out of the bed.

## 2013-06-15 ENCOUNTER — Encounter (HOSPITAL_COMMUNITY): Payer: Self-pay | Admitting: *Deleted

## 2013-06-15 DIAGNOSIS — J189 Pneumonia, unspecified organism: Secondary | ICD-10-CM

## 2013-06-15 DIAGNOSIS — E43 Unspecified severe protein-calorie malnutrition: Secondary | ICD-10-CM | POA: Insufficient documentation

## 2013-06-15 DIAGNOSIS — I4891 Unspecified atrial fibrillation: Secondary | ICD-10-CM

## 2013-06-15 DIAGNOSIS — E46 Unspecified protein-calorie malnutrition: Secondary | ICD-10-CM

## 2013-06-15 DIAGNOSIS — W19XXXA Unspecified fall, initial encounter: Secondary | ICD-10-CM

## 2013-06-15 LAB — MRSA PCR SCREENING: MRSA by PCR: NEGATIVE

## 2013-06-15 MED ORDER — DIGOXIN 125 MCG PO TABS
0.1250 mg | ORAL_TABLET | Freq: Every day | ORAL | Status: DC
  Administered 2013-06-15 – 2013-06-16 (×2): 0.125 mg via ORAL
  Filled 2013-06-15 (×2): qty 1

## 2013-06-15 MED ORDER — MIRABEGRON ER 25 MG PO TB24
25.0000 mg | ORAL_TABLET | Freq: Every day | ORAL | Status: DC
Start: 2013-06-15 — End: 2013-06-16
  Administered 2013-06-15 – 2013-06-16 (×2): 25 mg via ORAL
  Filled 2013-06-15 (×3): qty 1

## 2013-06-15 MED ORDER — ATORVASTATIN CALCIUM 10 MG PO TABS
10.0000 mg | ORAL_TABLET | Freq: Every day | ORAL | Status: DC
  Administered 2013-06-15 – 2013-06-16 (×2): 10 mg via ORAL
  Filled 2013-06-15 (×2): qty 1

## 2013-06-15 MED ORDER — ALPRAZOLAM 0.5 MG PO TABS
0.5000 mg | ORAL_TABLET | Freq: Two times a day (BID) | ORAL | Status: DC
  Administered 2013-06-15 – 2013-06-16 (×3): 0.5 mg via ORAL
  Filled 2013-06-15 (×3): qty 1

## 2013-06-15 MED ORDER — LEVOTHYROXINE SODIUM 50 MCG PO TABS
50.0000 ug | ORAL_TABLET | Freq: Every day | ORAL | Status: DC
  Administered 2013-06-15 – 2013-06-16 (×2): 50 ug via ORAL
  Filled 2013-06-15 (×3): qty 1

## 2013-06-15 MED ORDER — LEVOFLOXACIN 750 MG PO TABS
750.0000 mg | ORAL_TABLET | ORAL | Status: DC
  Administered 2013-06-15: 13:00:00 750 mg via ORAL
  Filled 2013-06-15: qty 1

## 2013-06-15 MED ORDER — SODIUM CHLORIDE 0.9 % IJ SOLN: 3.0000 mL | INTRAMUSCULAR | Status: DC | PRN

## 2013-06-15 MED ORDER — ENSURE COMPLETE PO LIQD
237.0000 mL | Freq: Two times a day (BID) | ORAL | Status: DC
  Administered 2013-06-16: 237 mL via ORAL

## 2013-06-15 MED ORDER — LEVETIRACETAM 500 MG PO TABS
500.0000 mg | ORAL_TABLET | Freq: Two times a day (BID) | ORAL | Status: DC
  Administered 2013-06-15 – 2013-06-16 (×3): 500 mg via ORAL
  Filled 2013-06-15 (×5): qty 1

## 2013-06-15 MED ORDER — SODIUM CHLORIDE 0.9 % IJ SOLN
3.0000 mL | Freq: Two times a day (BID) | INTRAMUSCULAR | Status: DC
  Administered 2013-06-15 – 2013-06-16 (×4): 3 mL via INTRAVENOUS

## 2013-06-15 MED ORDER — SODIUM CHLORIDE 0.9 % IV SOLN: 250.0000 mL | INTRAVENOUS | Status: DC | PRN

## 2013-06-15 MED ORDER — ADULT MULTIVITAMIN W/MINERALS CH
1.0000 | ORAL_TABLET | Freq: Every day | ORAL | Status: DC
  Administered 2013-06-15 – 2013-06-16 (×2): 1 via ORAL
  Filled 2013-06-15 (×2): qty 1

## 2013-06-15 MED ORDER — POLYETHYLENE GLYCOL 3350 17 G PO PACK
17.0000 g | PACK | Freq: Every day | ORAL | Status: DC
  Administered 2013-06-15 – 2013-06-16 (×2): 17 g via ORAL
  Filled 2013-06-15 (×2): qty 1

## 2013-06-15 MED ORDER — RIVAROXABAN 15 MG PO TABS
15.0000 mg | ORAL_TABLET | Freq: Two times a day (BID) | ORAL | Status: DC
  Administered 2013-06-15 – 2013-06-16 (×3): 15 mg via ORAL
  Filled 2013-06-15 (×5): qty 1

## 2013-06-15 MED ORDER — ESCITALOPRAM OXALATE 5 MG PO TABS
5.0000 mg | ORAL_TABLET | Freq: Every day | ORAL | Status: DC
  Administered 2013-06-15 – 2013-06-16 (×2): 5 mg via ORAL
  Filled 2013-06-15 (×2): qty 1

## 2013-06-15 MED ORDER — INFLUENZA VAC SPLIT QUAD 0.5 ML IM SUSP
0.5000 mL | INTRAMUSCULAR | Status: AC
  Administered 2013-06-16: 0.5 mL via INTRAMUSCULAR
  Filled 2013-06-15: qty 0.5

## 2013-06-15 NOTE — Progress Notes (Signed)
UR Completed.  Kassity Woodson Jane [phone] 06/15/2013  

## 2013-06-15 NOTE — Progress Notes (Signed)
INITIAL NUTRITION ASSESSMENT  DOCUMENTATION CODES Per approved criteria  -Severe malnutrition in the context of chronic illness   INTERVENTION: 1.  Supplements; Ensure Complete po BID, each supplement provides 350 kcal and 13 grams of protein. 2.  General healthful diet; encourage intake as able  NUTRITION DIAGNOSIS: Inadequate oral intake related to poor appetite, decreased mobility as evidenced by pt/family report.   Monitor:  1.  Food/Beverage; pt meeting >/=90% estimated needs with tolerance. 2.  Wt/wt change; monitor trends  Reason for Assessment: MST  79 y.o. female  Admitting Dx: Encounter for examination for admission to nursing home  ASSESSMENT: Pt admitted with FTT and frequent falls at home.  Husband is no longer able to care for her.  Pt with PMHx of prior CVA, dysphagia, HTN, seizures, and weakness.  Pt recently hospitalized (9/19) and at that time reported inability to eat.  Pt reports on this admission that she has been eating well- 3 meals daily. She also reports drinking Ensure at home BID.  Pt states she has always been thin.  Husband reports she "has lost a lot of weight in the past 6 months."  Nutrition Focused Physical Exam:  Subcutaneous Fat:  Orbital Region: moderate wasting Upper Arm Region: severe wasting Thoracic and Lumbar Region: severe wasting  Muscle:  Temple Region: severe wasting Clavicle Bone Region: severe wasting Clavicle and Acromion Bone Region: severe wasting Scapular Bone Region: severe wasting Dorsal Hand: severe wasting Patellar Region: severe wasting Anterior Thigh Region: severe wasting Posterior Calf Region: severe wasting  Edema: none present.  Pt meets criteria for severe MALNUTRITION in the context of chronic illness as evidenced by nutrition focused physical exam showing severe muscle and fat wasting.   Height: Ht Readings from Last 1 Encounters:  06/15/13 5\' 8"  (1.727 m)    Weight: Wt Readings from Last 1  Encounters:  06/15/13 96 lb 1.9 oz (43.6 kg)    Ideal Body Weight: 140 lbs  % Ideal Body Weight: 68%  Wt Readings from Last 10 Encounters:  06/15/13 96 lb 1.9 oz (43.6 kg)  06/08/13 99 lb 6.4 oz (45.088 kg)  05/10/13 96 lb 12.8 oz (43.908 kg)  01/10/13 91 lb 6.4 oz (41.459 kg)  01/03/13 95 lb 6.4 oz (43.273 kg)  12/10/12 95 lb 6.4 oz (43.273 kg)  10/12/12 102 lb (46.267 kg)  09/27/12 123 lb 7.3 oz (56 kg)  09/27/12 123 lb 7.3 oz (56 kg)  04/26/12 106 lb 6.4 oz (48.263 kg)    Usual Body Weight: 105-110 lbs  % Usual Body Weight: 91%  BMI:  Body mass index is 14.62 kg/(m^2).  Estimated Nutritional Needs: Kcal: 1220-1400 Protein: 50-60g Fluid: >1.2 L/day  Skin: Stage 1 pressure ulcer  Diet Order: Cardiac  EDUCATION NEEDS: -Education needs addressed  No intake or output data in the 24 hours ending 06/15/13 1435  Last BM: 9/26  Labs:   Recent Labs Lab 06/09/13 0600 06/14/13 2232  NA 135 140  K 3.0* 3.6  CL 103 100  CO2 24  --   BUN 12 12  CREATININE 0.58 0.70  CALCIUM 8.7  --   GLUCOSE 95 97    CBG (last 3)  No results found for this basename: GLUCAP,  in the last 72 hours  Scheduled Meds: . ALPRAZolam  0.5 mg Oral BID  . atorvastatin  10 mg Oral Daily  . digoxin  0.125 mg Oral Daily  . escitalopram  5 mg Oral Daily  . [START ON 06/16/2013] influenza vac  split quadrivalent PF  0.5 mL Intramuscular Tomorrow-1000  . levETIRAcetam  500 mg Oral BID  . levofloxacin  750 mg Oral QODAY  . levothyroxine  50 mcg Oral QAC breakfast  . mirabegron ER  25 mg Oral Daily  . multivitamin with minerals  1 tablet Oral Daily  . polyethylene glycol  17 g Oral Daily  . Rivaroxaban  15 mg Oral BID WC  . sodium chloride  3 mL Intravenous Q12H    Continuous Infusions:   Past Medical History  Diagnosis Date  . Dysphagia   . Atrial fibrillation     a. Dx 05/2011 at time of CVA.  Marland Kitchen Hyperlipidemia   . HTN (hypertension)   . Hypothyroidism   . Pneumonia     "alot  of pneumonia" (09/21/2012)  . Syncope and collapse     "fell @ [place]; hit left forehead"; left hip fx/notes 09/20/2012  . Vertigo   . Arthritis     "some; all over" (09/22/2011)  . Anxiety   . Depression   . Seizures      a. 05/2011 right MCA CVA in setting of new onset afib s/p thrombolysis/thrombolytics. b. Recurrent CVA 10/2011 in setting of subtherap.INR.  "only one she ever had was 3 months after her stroke" (09/21/2012  . Seizure 10/2011  . Stroke 05/2011    a. 05/2011 right MCA CVA in setting of new onset afib s/p thrombolysis/thrombolytics. b. Recurrent CVA 10/2011 in setting of subtherap.INR.  . DVT of lower extremity (deep venous thrombosis) 01/02/2013    LLE    Past Surgical History  Procedure Laterality Date  . Lumbar disc surgery  1981  . Vaginal hysterectomy  1982  . Pleural scarification  1977?; 1986    "2 lung surgeries; my lungs had collapsed" (09/21/2012)  . Tonsillectomy and adenoidectomy  1950?  Marland Kitchen Appendectomy  1982  . Thrombolysis  05/2011    [name] 09/20/2012  . Hip arthroplasty  09/21/2012    Procedure: ARTHROPLASTY BIPOLAR HIP;  Surgeon: Kerrin Champagne, MD;  Location: [facility] OR;  Service: Orthopedics;  Laterality: Left;  . Joint replacement      Loyce Dys, MS RD LDN Clinical Inpatient Dietitian Pager: [phone] Weekend/After hours pager: [phone]

## 2013-06-15 NOTE — Progress Notes (Signed)
[facility] PROGRESS NOTE  [name] ONG:[id number] DOB: 1931-04-14 DOA: 06/14/2013 PCP: April Sauer, MD  Assessment/Plan: 1. Deconditioning. Patient recently hospitalized for community-acquired pneumonia. Deconditioned likely resulting from hospitalization and would benefit from rehabilitation. Physical therapy has been consulted for evaluation.she does not appear to be having active cardiopulmonary issues, has been hemodynamically stable, with ventricular rates in the 70s to 80s, satting upper 90s on room air. Chest x-ray did not show acute infiltrate. Provide supportive care. 2. Community acquire pneumonia. Patient having a recent hospitalization dated community acquire pneumonia, with repeat chest x-ray showing no acute changes. She is on oral Levaquin therapy. Patient otherwise is satting upper 90s on room air, appears stable/improved. 3. Atrial fibrillation. Presently rate controlled, continue digoxin and anticoagulation. 4. Status post fall. Likely secondary to deconditioning.  5. Dyslipidemia. Continue atorvastatin 6. Nutrition. Place patient on regular diet 7. DVT prophylaxis. Patient fully anticoagulated  Code Status: Full Family Communication: Plan discussed with patient and her husband present at bedside Disposition Plan: Social work consult for placement   Antibiotics:  Levaquin  HPI/Subjective: Patient is a pleasant 79 year old female with a past medical history of intra-fibrillation, history of CVA, who was recent discharged from our service on 06/09/2013 at which time she was treated for community acquire pneumonia.she was discharged on Levaquin 750 mg every other day. At home patient has been doing poorly,having generalized weakness, recurrent falls, with her husband stating unable to help her.  Objective: Filed Vitals:   06/15/13 0438  BP: 130/68  Pulse: 75  Temp: 98 F (36.7 C)  Resp: 16   No intake or output data in the 24 hours ending 06/15/13  1419 Filed Weights   06/15/13 0110  Weight: 43.6 kg (96 lb 1.9 oz)    Exam:   General:  Patient is in no acute distress, awake alert cooperative  Cardiovascular: Regular rate and rhythm normal S1-S2 no murmurs rubs or gallop  Respiratory: Clear to auscultation bilaterally no wheezing rhonchi or rales  Abdomen: Soft nontender nondistended positive bowel  Musculoskeletal: Present range of motion to all extremities   Data Reviewed: Basic Metabolic Panel:  Recent Labs Lab 06/09/13 0600 06/14/13 2232  NA 135 140  K 3.0* 3.6  CL 103 100  CO2 24  --   GLUCOSE 95 97  BUN 12 12  CREATININE 0.58 0.70  CALCIUM 8.7  --    Liver Function Tests: No results found for this basename: AST, ALT, ALKPHOS, BILITOT, PROT, ALBUMIN,  in the last 168 hours No results found for this basename: LIPASE, AMYLASE,  in the last 168 hours No results found for this basename: AMMONIA,  in the last 168 hours CBC:  Recent Labs Lab 06/09/13 0600 06/14/13 1951 06/14/13 2232  WBC 7.9 6.4  --   NEUTROABS 5.8 4.2  --   HGB 10.8* 11.4* 11.2*  HCT 31.0* 32.8* 33.0*  MCV 90.9 90.4  --   PLT 164 247  --    Cardiac Enzymes: No results found for this basename: CKTOTAL, CKMB, CKMBINDEX, TROPONINI,  in the last 168 hours BNP (last 3 results) No results found for this basename: PROBNP,  in the last 8760 hours CBG: No results found for this basename: GLUCAP,  in the last 168 hours  Recent Results (from the past 240 hour(s))  CULTURE, BLOOD (ROUTINE X 2)     Status: None   Collection Time    06/07/13  9:45 PM      Result Value Range Status  Specimen Description BLOOD ARM LEFT   Final   Special Requests BOTTLES DRAWN AEROBIC AND ANAEROBIC 10CC   Final   Culture  Setup Time     Final   Value: 06/08/2013 03:36     Performed at [facility]   Culture     Final   Value: NO GROWTH 5 DAYS     Performed at [facility]   Report Status 06/14/2013 FINAL   Final  CULTURE, BLOOD (ROUTINE X  2)     Status: None   Collection Time    06/07/13  9:50 PM      Result Value Range Status   Specimen Description BLOOD HAND LEFT   Final   Special Requests BOTTLES DRAWN AEROBIC ONLY 10CC   Final   Culture  Setup Time     Final   Value: 06/08/2013 03:36     Performed at [facility]   Culture     Final   Value: NO GROWTH 5 DAYS     Performed at [facility]   Report Status 06/14/2013 FINAL   Final  MRSA PCR SCREENING     Status: None   Collection Time    06/15/13  5:22 AM      Result Value Range Status   MRSA by PCR NEGATIVE  NEGATIVE Final   Comment:            The GeneXpert MRSA Assay (FDA     approved for NASAL specimens     only), is one component of a     comprehensive MRSA colonization     surveillance program. It is not     intended to diagnose MRSA     infection nor to guide or     monitor treatment for     MRSA infections.     Studies: Dg Chest 2 View  06/14/2013   CLINICAL DATA:  Cough, history hypertension  EXAM: CHEST  2 VIEW  COMPARISON:  06/07/2013  FINDINGS: Mild enlargement of cardiac silhouette.  Atherosclerotic calcification of a mildly tortuous thoracic aorta.  Mediastinal contours and pulmonary vascularity normal.  Emphysematous changes with postsurgical changes at right apex.  No acute infiltrate, pleural effusion or pneumothorax.  Skin fold projects over lower right chest.  Diffuse osseous demineralization with pectus excavatum.  IMPRESSION: COPD changes.  Enlargement of cardiac silhouette.  No acute abnormalities.   Electronically Signed   By: Ulyses Southward M.D.   On: 06/14/2013 20:55    Scheduled Meds: . ALPRAZolam  0.5 mg Oral BID  . atorvastatin  10 mg Oral Daily  . digoxin  0.125 mg Oral Daily  . escitalopram  5 mg Oral Daily  . [START ON 06/16/2013] influenza vac split quadrivalent PF  0.5 mL Intramuscular Tomorrow-1000  . levETIRAcetam  500 mg Oral BID  . levofloxacin  750 mg Oral QODAY  . levothyroxine  50 mcg Oral QAC breakfast   . mirabegron ER  25 mg Oral Daily  . multivitamin with minerals  1 tablet Oral Daily  . polyethylene glycol  17 g Oral Daily  . Rivaroxaban  15 mg Oral BID WC  . sodium chloride  3 mL Intravenous Q12H   Continuous Infusions:   Principal Problem:   Encounter for examination for admission to nursing home Active Problems:   Fall   FTT (failure to thrive) in adult    Time spent: 35 minutes    Jeralyn Bennett  [facility] Pager [phone]. If 7PM-7AM,  please contact night-coverage at [email or web address], password [facility] 06/15/2013, 2:19 PM  LOS: 1 day

## 2013-06-15 NOTE — ED Notes (Signed)
MD at bedside. 

## 2013-06-15 NOTE — Evaluation (Signed)
Physical Therapy Evaluation Patient Details Name: [name] MRN: [id number] DOB: [birth year] Today's Date: 06/15/2013 Time: 3086-5784 PT Time Calculation (min): 36 min  PT Assessment / Plan / Recommendation History of Present Illness  79 yo female just d/c from here for pna, lives with husband.  Pt has not been doing well, frequently falling at home and husband can no longer care for her at home.  She denies any pain anywhere after today's fall at home.  She is overall weak.  Admitted with FTT, falls, weakness.  Clinical Impression  Patient presents with problems listed below.  Will benefit from acute PT to maximize independence prior to discharge.  Recommend SNF for continued therapy at discharge.    PT Assessment  Patient needs continued PT services    Follow Up Recommendations  SNF    Does the patient have the potential to tolerate intense rehabilitation      Barriers to Discharge Decreased caregiver support Husband unable to provide care needed for patient 24 hours/day    Equipment Recommendations  None recommended by PT    Recommendations for Other Services     Frequency Min 3X/week    Precautions / Restrictions Precautions Precautions: Fall Precaution Comments: multiple falls at home. Restrictions Weight Bearing Restrictions: No   Pertinent Vitals/Pain       Mobility  Bed Mobility Bed Mobility: Rolling Right;Right Sidelying to Sit;Sitting - Scoot to Edge of Bed Rolling Right: 4: Min assist;With rail Right Sidelying to Sit: 4: Min assist;With rails;HOB flat Sitting - Scoot to Edge of Bed: 4: Min assist Details for Bed Mobility Assistance: Verbal cues for technique for mobility.  Tactile cues to use LUE to reach for rail to roll to Rt side.  Assist to flex LLE and move LE's off of bed.  Assist to move trunk to sitting position.  Cues for patient to let go of rail with LUE to complete sitting.   Transfers Transfers: Sit to Stand;Stand to Sit Sit to Stand: 4:  Min assist;With upper extremity assist;From bed Stand to Sit: 4: Min assist;With upper extremity assist;With armrests;To chair/3-in-1 Details for Transfer Assistance: Verbal cues for hand placement.  Assist to rise to standing and for balance.  Ambulation/Gait Ambulation/Gait Assistance: 4: Min assist Ambulation Distance (Feet): 64 Feet Assistive device: Rolling walker Ambulation/Gait Assistance Details: Verbal cues for safe use of RW.  Patient with moderate lateral lean to right side - cues for midline stance during gait.  When sitting in chair, verbal cues to reach back for armrests, and assist to control descent.  Patient sat over to right side of chair. Gait Pattern: Step-through pattern;Decreased stride length;Decreased weight shift to left;Lateral trunk lean to right;Trunk flexed Gait velocity: decreased    Exercises     PT Diagnosis: Difficulty walking;Abnormality of gait;Generalized weakness;Hemiplegia non-dominant side  PT Problem List: Decreased strength;Decreased activity tolerance;Decreased balance;Decreased mobility;Decreased coordination;Decreased knowledge of use of DME;Cardiopulmonary status limiting activity PT Treatment Interventions: DME instruction;Gait training;Functional mobility training;Balance training;Patient/family education     PT Goals(Current goals can be found in the care plan section) Acute Rehab PT Goals Patient Stated Goal: To get stronger PT Goal Formulation: With patient/family Time For Goal Achievement: 06/22/13 Potential to Achieve Goals: Good  Visit Information  Last PT Received On: 06/15/13 Assistance Needed: +1 History of Present Illness: 79 yo female just d/c from here for pna, lives with husband.  Pt has not been doing well, frequently falling at home and husband can no longer care for her at home.  She denies any pain anywhere after today's fall at home.  She is overall weak.  Admitted with FTT, falls, weakness.       Prior Functioning   Home Living Family/patient expects to be discharged to:: Skilled nursing facility Living Arrangements: Spouse/significant other Home Equipment: Walker - 2 wheels Prior Function Level of Independence: Independent with assistive device(s);Needs assistance (Had been independent prior to admit with pna.) Gait / Transfers Assistance Needed: Since hospital discharge, patient requires assist to stand from chair and ambulate at home. Communication Communication: No difficulties Dominant Hand: Right    Cognition  Cognition Arousal/Alertness: Awake/alert Behavior During Therapy: Anxious;Flat affect Overall Cognitive Status: Within Functional Limits for tasks assessed    Extremity/Trunk Assessment Upper Extremity Assessment Upper Extremity Assessment: Generalized weakness;LUE deficits/detail LUE Deficits / Details: Strength 3/5.  Question some neglect from CVA - needs cues to use LUE functionally LUE Coordination: decreased fine motor Lower Extremity Assessment Lower Extremity Assessment: Generalized weakness;LLE deficits/detail LLE Deficits / Details: Strength 3+/5 LLE Coordination: decreased gross motor Cervical / Trunk Assessment Cervical / Trunk Assessment: Kyphotic Cervical / Trunk Exceptions: flexed trunk with right lateral lean in sitting and standing   Balance Balance Balance Assessed: Yes Static Sitting Balance Static Sitting - Balance Support: Right upper extremity supported;Feet supported Static Sitting - Level of Assistance: 4: Min assist Static Sitting - Comment/# of Minutes: 5 minutes - Initially patient leaning to right and posteriorly.  Verbal and tactile cues to bring trunk forward over hips, and to midline position. Static Standing Balance Static Standing - Balance Support: Bilateral upper extremity supported Static Standing - Level of Assistance: 4: Min assist Static Standing - Comment/# of Minutes: 2 minutes.  Lateral lean to right  End of Session PT - End of  Session Equipment Utilized During Treatment: Gait belt Activity Tolerance: Patient limited by fatigue Patient left: in chair;with call bell/phone within reach;with family/visitor present Nurse Communication: Mobility status  GP Functional Assessment Tool Used: Clinical judgement Functional Limitation: Mobility: Walking and moving around Mobility: Walking and Moving Around Current Status ([id number]): At least 20 percent but less than 40 percent impaired, limited or restricted Mobility: Walking and Moving Around Goal Status [phone]): At least 1 percent but less than 20 percent impaired, limited or restricted   Vena Austria 06/15/2013, 1:34 PM Durenda Hurt. Renaldo Fiddler, [facility] Acute Rehab Services Pager [phone]

## 2013-06-15 NOTE — Clinical Social Work Psychosocial (Signed)
Clinical Social Work Department BRIEF PSYCHOSOCIAL ASSESSMENT 06/15/2013  Patient:  [name], [name]     Account Number:  [id number]     Admit date:  06/14/2013  Clinical Social Worker:  Etta Grandchild  Date/Time:  06/15/2013 05:42 PM  Referred by:  CSW  Date Referred:  06/15/2013 Referred for  SNF Placement   Other Referral:   Interview type:  Family Other interview type:    PSYCHOSOCIAL DATA Living Status:  HUSBAND Admitted from facility:   Level of care:  Independent Living Primary support name:  Rueben Edgecombe Primary support relationship to patient:  SPOUSE Degree of support available:   Poor- patient lives in independent living with spouse.  He is unable to provide necessary care for patient in current setting.    CURRENT CONCERNS Current Concerns  Post-Acute Placement   Other Concerns:   n/a    SOCIAL WORK ASSESSMENT / PLAN CSW met with patient and spouse at bedside.  CSW met with patient several minutes later with MD.  Mr. Dominski states he would prefer patient be transferred to rehab facility, MD and CSW agree.  Patient currently observation status. CSW arranged transfer to [facility] tomorrow. CSW discussed with spouse, he plans to meet with Wille Celeste (admissions at [facility]) tomorrow at 10 am to complete paperwork.  CSW will continue to follow patient throughout discharge process.   Assessment/plan status:  Psychosocial Support/Ongoing Assessment of Needs Other assessment/ plan:   n/a   Information/referral to community resources:   communication with Joetta Manners    PATIENT'S/FAMILY'S RESPONSE TO PLAN OF CARE: Patient's spouse is eager for patient to begin rehab to gain strength.  Patient verbalized understanding of plan for DC tomorrow.   Kathrin Penner, MSW, LCSW Weekend coverage

## 2013-06-16 DIAGNOSIS — D649 Anemia, unspecified: Secondary | ICD-10-CM

## 2013-06-16 DIAGNOSIS — E43 Unspecified severe protein-calorie malnutrition: Secondary | ICD-10-CM

## 2013-06-16 MED ORDER — ENSURE COMPLETE PO LIQD: 237.0000 mL | Freq: Two times a day (BID) | ORAL | Status: AC

## 2013-06-16 MED ORDER — ALPRAZOLAM 0.5 MG PO TABS: 0.5000 mg | ORAL_TABLET | Freq: Two times a day (BID) | ORAL | Status: AC

## 2013-06-16 NOTE — Progress Notes (Signed)
Utilization Review completed.  

## 2013-06-16 NOTE — Progress Notes (Signed)
Clinical Social Work Department CLINICAL SOCIAL WORK PLACEMENT NOTE 06/16/2013  Patient:  [name], [name]  Account Number:  [id number] Admit date:  06/14/2013  Clinical Social Worker:  Jacelyn Grip  Date/time:  06/16/2013 01:20 PM  Clinical Social Work is seeking post-discharge placement for this patient at the following level of care:   SKILLED NURSING   (*CSW will update this form in Epic as items are completed)   06/15/2013  Patient/family provided with [facility] Department of Clinical Social Work's list of facilities offering this level of care within the geographic area requested by the patient (or if unable, by the patient's family).  06/15/2013  Patient/family informed of their freedom to choose among providers that offer the needed level of care, that participate in Medicare, Medicaid or managed care program needed by the patient, have an available bed and are willing to accept the patient.  06/15/2013  Patient/family informed of [facility]' ownership interest in [facility], as well as of the fact that they are under no obligation to receive care at this facility.  PASARR submitted to EDS on 06/15/2013 PASARR number received from [employer] on 06/15/2013  FL2 transmitted to all facilities in geographic area requested by pt/family on  06/15/2013 FL2 transmitted to all facilities within larger geographic area on   Patient informed that his/her managed care company has contracts with or will negotiate with  certain facilities, including the following:     Patient/family informed of bed offers received:  06/15/2013 Patient chooses bed at [facility] Physician recommends and patient chooses bed at    Patient to be transferred to [facility] on  06/16/2013 Patient to be transferred to facility by ambulance Sharin Mons)  The following physician request were entered in Epic:   Additional Comments:   Jacklynn Lewis, MSW,  [facility]  Clinical Social Work Weekend coverage [phone]

## 2013-06-16 NOTE — Progress Notes (Signed)
Pt for discharge to [facility].  CSW facilitated pt discharge needs including contacting facility, faxing pt discharge information via [facility], discussing with pt and pt husband at bedside, providing RN phone number to cal report, and arranging ambulance transportation for pt to [facility].   No further social work needs identified at this time.  CSW signing off.   Jacklynn Lewis, MSW, [employer]  Clinical Social Work Weekend coverage [phone]

## 2013-06-16 NOTE — Discharge Summary (Signed)
Physician Discharge Summary  [name] YNW:[id number] DOB: 1931/07/09 DOA: 06/14/2013  PCP: [name], MD  Admit date: 06/14/2013 Discharge date: 06/16/2013  Time spent: 40 minutes  Recommendations for Outpatient Follow-up:  1. Patient deconditioning after recent hospitalization, will require rehabilitation.  Discharge Diagnoses:  Principal Problem:   Encounter for examination for admission to nursing home Active Problems:   Fall   FTT (failure to thrive) in adult   Protein-calorie malnutrition, severe   Discharge Condition: Stable/improved  Diet recommendation: Heart healthy diet with protein boost 3 times a day with meals  Filed Weights   06/15/13 0110  Weight: 43.6 kg (96 lb 1.9 oz)    History of present illness:  79 yo female just d/c from here for pna, lives with husband. Pt has not been doing well, frequently falling at home and husband can no longer care for her at home. She denies any pain anywhere after today's fall at home. She is overall weak. No fevers. No sob. No n/v/d. Taking her levaquin at home. No active medical issues, except needs rehab.   Hospital Course:  Patient is a pleasant 79 year old female with a past medical history of atrial for ablation, history of CVA, who was recently discharged from our service on 06/09/2013 at which time she was treated for community acquire pneumonia. She was discharged in stable condition on Levaquin 250 mg PO every other day. At home she'll be doing poorly, having ongoing generalized weakness with recurrent falls, with her husband states she's unable to care for her. She was found to be weak, requiring assistance with ambulation. Patient was examined and evaluated by physical therapy during this hospitalization who recommended continuing physical therapy services and discharge to skilled nursing facility. Patient also with severe protein calorie malnutrition, for which she has been given protein boost 3 times a day.  During this hospitalization she remained stable, with no complications. She remained hemodynamically stable, afebrile, with lab work showing no changes. With regard to her pneumonia, she had repeat chest x-ray done on 06/14/2013 which showed COPD changes and no acute abnormalities. Patient was discharged in stable condition to skilled nursing facility on 06/08/2013.  Consultations:  Physical therapy  Discharge Exam: Filed Vitals:   06/16/13 0546  BP: 126/67  Pulse: 74  Temp: 97.6 F (36.4 C)  Resp: 18    General: Patient is in no acute distress, we helped her out of bed to chair this morning, she required one person assist. Otherwise reports feeling at her baseline, tolerating by mouth intake Cardiovascular: Regular rate and rhythm normal S1-S2 no murmurs rubs or Respiratory: Lungs were clear auscultation this morning, no wheezing rhonchi Abdomen: Soft nontender nondistended positive bowel sound Extremities: I do not no cyanosis clubbing or edema  Discharge Instructions  Discharge Orders   Future Orders Complete By Expires   Call MD for:  difficulty breathing, headache or visual disturbances  As directed    Call MD for:  persistant dizziness or light-headedness  As directed    Call MD for:  severe uncontrolled pain  As directed    Call MD for:  temperature >100.4  As directed    Diet - low sodium heart healthy  As directed    Increase activity slowly  As directed        Medication List    STOP taking these medications       levofloxacin 750 MG tablet  Commonly known as:  LEVAQUIN     OVER THE COUNTER MEDICATION  TAKE these medications       ALPRAZolam 0.5 MG tablet  Commonly known as:  XANAX  Take 0.5 mg by mouth 2 (two) times daily.     atorvastatin 10 MG tablet  Commonly known as:  LIPITOR  Take 10 mg by mouth daily.     CALTRATE 600+D 600-400 MG-UNIT per tablet  Generic drug:  Calcium Carbonate-Vitamin D  Take 1 tablet by mouth at bedtime.     digoxin  0.125 MG tablet  Commonly known as:  LANOXIN  Take 0.125 mg by mouth daily.     docusate sodium 100 MG capsule  Commonly known as:  COLACE  Take 100 mg by mouth at bedtime.     escitalopram 5 MG tablet  Commonly known as:  LEXAPRO  Take 5 mg by mouth daily.     feeding supplement Liqd  Take 237 mLs by mouth 2 (two) times daily between meals.     levETIRAcetam 500 MG tablet  Commonly known as:  KEPPRA  Take 500 mg by mouth 2 (two) times daily.     levothyroxine 50 MCG tablet  Commonly known as:  SYNTHROID, LEVOTHROID  Take 50 mcg by mouth daily.     mirabegron ER 25 MG Tb24 tablet  Commonly known as:  MYRBETRIQ  Take 25 mg by mouth daily.     multivitamin with minerals Tabs tablet  Take 1 tablet by mouth daily.     polyethylene glycol packet  Commonly known as:  MIRALAX / GLYCOLAX  Take 17 g by mouth daily.     Rivaroxaban 15 MG Tabs tablet  Commonly known as:  XARELTO  Take 1 tablet (15 mg total) by mouth 2 (two) times daily.       No Known Allergies     Follow-up Information   Follow up with [name], MD In 1 week.   Specialty:  Internal Medicine   Contact information:   [address] [facility] [place] [address] [phone]        The results of significant diagnostics from this hospitalization (including imaging, microbiology, ancillary and laboratory) are listed below for reference.    Significant Diagnostic Studies: Dg Chest 2 View  06/14/2013   CLINICAL DATA:  Cough, history hypertension  EXAM: CHEST  2 VIEW  COMPARISON:  06/07/2013  FINDINGS: Mild enlargement of cardiac silhouette.  Atherosclerotic calcification of a mildly tortuous thoracic aorta.  Mediastinal contours and pulmonary vascularity normal.  Emphysematous changes with postsurgical changes at right apex.  No acute infiltrate, pleural effusion or pneumothorax.  Skin fold projects over lower right chest.  Diffuse osseous demineralization with pectus  excavatum.  IMPRESSION: COPD changes.  Enlargement of cardiac silhouette.  No acute abnormalities.   Electronically Signed   By: Ulyses Southward M.D.   On: 06/14/2013 20:55   Dg Chest 2 View  06/07/2013   CLINICAL DATA:  Fatigue and hypertension  EXAM: CHEST  2 VIEW  COMPARISON:  Feb 05, 2013  FINDINGS: There is patchy infiltrate in the right base. There is underlying emphysema. Heart size is normal. Pulmonary vascularity reflects underlying emphysema.  There is marked pectus excavatum. No pneumothorax. No adenopathy. There is atherosclerotic change in the aorta.  IMPRESSION: Patchy infiltrate right base. Underlying emphysema. Pectus excavatum.   Electronically Signed   By: Bretta Bang   On: 06/07/2013 20:16   Ct Head Wo Contrast  06/07/2013   CLINICAL DATA:  Lethargy, shallow breathing, recent discharge for fall.  EXAM:  CT HEAD WITHOUT CONTRAST  TECHNIQUE: Contiguous axial images were obtained from the base of the skull through the vertex without intravenous contrast.  COMPARISON:  MRI brain dated 05/31/2013.  FINDINGS: No evidence of parenchymal hemorrhage or extra-axial fluid collection. No mass lesion, mass effect, or midline shift.  No CT evidence of acute infarction.  Encephalomalacic changes related to remote right MCA distribution infarct.  Subcortical white matter and periventricular small vessel ischemic changes. Intracranial atherosclerosis.  Global cortical atrophy with prominence of the bifrontal extra-axial CSF spaces. No ventriculomegaly.  The visualized paranasal sinuses are essentially clear. The mastoid air cells are unopacified.  No evidence of calvarial fracture.  IMPRESSION: No evidence of acute intracranial abnormality.  Encephalomalacic changes related to prior right MCA distribution infarct.  Atrophy with small vessel ischemic changes and intracranial atherosclerosis.   Electronically Signed   By: Charline Bills M.D.   On: 06/07/2013 20:27   Ct Head Wo Contrast  05/31/2013    **ADDENDUM** CREATED: 05/31/2013 16:57:44  Results discussed with Dr. Leroy Kennedy 05/31/2013 4:50 p.m.  **END ADDENDUM** SIGNED BY: Almedia Balls. Constance Goltz, M.D.  05/31/2013   *RADIOLOGY REPORT*  Clinical Data: Left-sided weakness.  CT HEAD WITHOUT CONTRAST  Technique:  Contiguous axial images were obtained from the base of the skull through the vertex without contrast.  Comparison: 02/05/2013.  Findings: Series 2 image 20 raises possibility of small acute left frontal lobe infarct.  It is possible this represents partial volume averaging with a sulcus although different than on the prior exam therefore infarct is of concern.  No intracranial hemorrhage.  Remote large right hemispheric infarct with encephalomalacia right temporal lobe, opercular region and portion of the right parietal lobe.  Subsequent mild dilation of the right lateral ventricle.  No hydrocephalus.  Within the right pons (series 9 image 32), hypodensity is noted. This may represent artifact although limited for detecting small acute infarct.  No intracranial mass lesion detected on this unenhanced exam.  Mastoid air cells, middle ear cavities and visualized sinuses are clear.  Orbital structures unremarkable.  IMPRESSION: Question small acute non hemorrhagic infarct left frontal lobe.  Remote large right hemispheric infarct.  Probable streak artifact right pons limiting evaluation for detection of small acute infarct at this level.  Global atrophy without hydrocephalus.  Changes in to Dr. Leroy Kennedy as this is a code stroke.  Original Report Authenticated By: Lacy Duverney, M.D.   Ct Cervical Spine Wo Contrast  05/31/2013   CLINICAL DATA:  Fall, laceration  EXAM: CT CERVICAL SPINE WITHOUT CONTRAST  TECHNIQUE: Multidetector CT imaging of the cervical spine was performed without intravenous contrast. Multiplanar CT image reconstructions were also generated.  COMPARISON:  09/20/2012  FINDINGS: Axial images of cervical spine shows no acute fracture or subluxation.  Again noted right apical pleural thickening and surgical clips in right apex. Diffuse osteopenia.  Computer processed images shows no acute fracture or subluxation. Mild degenerative changes C1-C2 articulation. Again noted mild anterior spurring lower endplate of C3-C4 and C5 vertebral body. There is mild disc space flattening with mild anterior and mild posterior spurring at C5-C6 and C6-C7 level. No prevertebral soft tissue swelling. Cervical airway is patent.  IMPRESSION: No acute fracture or subluxation. Degenerative changes as described above.   Electronically Signed   By: Natasha Mead   On: 05/31/2013 19:15   Mr Brain Wo Contrast  05/31/2013   CLINICAL DATA:  Code stroke. Fall today. Abnormal CT of the head.  EXAM: MRI HEAD WITHOUT CONTRAST  TECHNIQUE: Multiplanar, multisequence  MR imaging was performed. No intravenous contrast was administered.  COMPARISON:  CT head of the same day. MRI brain 01/02/2013.  FINDINGS: The diffusion-weighted images demonstrate no evidence for acute or subacute infarction. A remote right MCA territory infarct is stable. Mild ex vacuo dilation is evident. Mild generalized atrophy and left-sided white matter disease is otherwise within normal limits for age.  Flow is present in the major intracranial arteries. The globes and orbits are intact. The paranasal sinuses and mastoid air cells are clear. Mild leftward delete that leftward nasal septal spurring is stable.  IMPRESSION: 1. No acute intracranial abnormality. 2. Remote right MCA territory infarct is stable.   Electronically Signed   By: Gennette Pac   On: 05/31/2013 20:20    Microbiology: Recent Results (from the past 240 hour(s))  CULTURE, BLOOD (ROUTINE X 2)     Status: None   Collection Time    06/07/13  9:45 PM      Result Value Range Status   Specimen Description BLOOD ARM LEFT   Final   Special Requests BOTTLES DRAWN AEROBIC AND ANAEROBIC 10CC   Final   Culture  Setup Time     Final   Value: 06/08/2013  03:36     Performed at [facility]   Culture     Final   Value: NO GROWTH 5 DAYS     Performed at [facility]   Report Status 06/14/2013 FINAL   Final  CULTURE, BLOOD (ROUTINE X 2)     Status: None   Collection Time    06/07/13  9:50 PM      Result Value Range Status   Specimen Description BLOOD HAND LEFT   Final   Special Requests BOTTLES DRAWN AEROBIC ONLY 10CC   Final   Culture  Setup Time     Final   Value: 06/08/2013 03:36     Performed at [facility]   Culture     Final   Value: NO GROWTH 5 DAYS     Performed at [facility]   Report Status 06/14/2013 FINAL   Final  MRSA PCR SCREENING     Status: None   Collection Time    06/15/13  5:22 AM      Result Value Range Status   MRSA by PCR NEGATIVE  NEGATIVE Final   Comment:            The GeneXpert MRSA Assay (FDA     approved for NASAL specimens     only), is one component of a     comprehensive MRSA colonization     surveillance program. It is not     intended to diagnose MRSA     infection nor to guide or     monitor treatment for     MRSA infections.     Labs: Basic Metabolic Panel:  Recent Labs Lab 06/14/13 2232  NA 140  K 3.6  CL 100  GLUCOSE 97  BUN 12  CREATININE 0.70   Liver Function Tests: No results found for this basename: AST, ALT, ALKPHOS, BILITOT, PROT, ALBUMIN,  in the last 168 hours No results found for this basename: LIPASE, AMYLASE,  in the last 168 hours No results found for this basename: AMMONIA,  in the last 168 hours CBC:  Recent Labs Lab 06/14/13 1951 06/14/13 2232  WBC 6.4  --   NEUTROABS 4.2  --   HGB 11.4* 11.2*  HCT 32.8* 33.0*  MCV 90.4  --  PLT 247  --    Cardiac Enzymes: No results found for this basename: CKTOTAL, CKMB, CKMBINDEX, TROPONINI,  in the last 168 hours BNP: BNP (last 3 results) No results found for this basename: PROBNP,  in the last 8760 hours CBG: No results found for this basename: GLUCAP,  in the last 168  hours     Signed:  Jeralyn Bennett  [facility] 06/16/2013, 9:46 AM

## 2013-07-05 ENCOUNTER — Telehealth: Payer: Self-pay | Admitting: Cardiology

## 2013-07-05 NOTE — Telephone Encounter (Signed)
New Problem  Pt recently had a heart attack and will be released from the hospital soon.. Pt is requesting to go to a nursing home in [place] w/ the Dr.'s support with this move// please advise.

## 2013-07-08 NOTE — Telephone Encounter (Signed)
Follow Up  2nd call  Pt's husband request's that his wife is transferred to a [place] medical facility and request that the Dr. clears this request// please call back to discuss.

## 2013-07-08 NOTE — Telephone Encounter (Signed)
Left message to call back to discuss.

## 2013-07-24 NOTE — Telephone Encounter (Signed)
Husband never returned several left messages.  Will wait for him to call back

## 2013-10-02 ENCOUNTER — Other Ambulatory Visit: Payer: Self-pay | Admitting: *Deleted

## 2013-10-03 ENCOUNTER — Other Ambulatory Visit: Payer: Self-pay | Admitting: Nurse Practitioner

## 2013-10-04 ENCOUNTER — Other Ambulatory Visit: Payer: Self-pay | Admitting: Nurse Practitioner

## 2013-10-07 ENCOUNTER — Other Ambulatory Visit: Payer: Self-pay | Admitting: Nurse Practitioner

## 2014-05-26 IMAGING — CT CT HEAD W/O CM
2 series · 16 of 30 positions shown, 18 images · non-contrast
Comparison: 02/05/2013.

***ADDENDUM*** CREATED: 05/31/2013 [DATE]

Results discussed with Dr. Dogaru 05/31/2013 [DATE] p.m.
***END ADDENDUM*** SIGNED BY: Rudi Jumper, M.D.
CLINICAL DATA: Left-sided weakness.
CT HEAD WITHOUT CONTRAST
TECHNIQUE: Contiguous axial images were obtained from the base of
the skull through the vertex without contrast.

[Series 2: head w/o · axial · non-contrast · 0.49mm/px · z∈[+103,+223]mm · 8 of 32 slices shown, 10 images]
[im 4/32  brain]
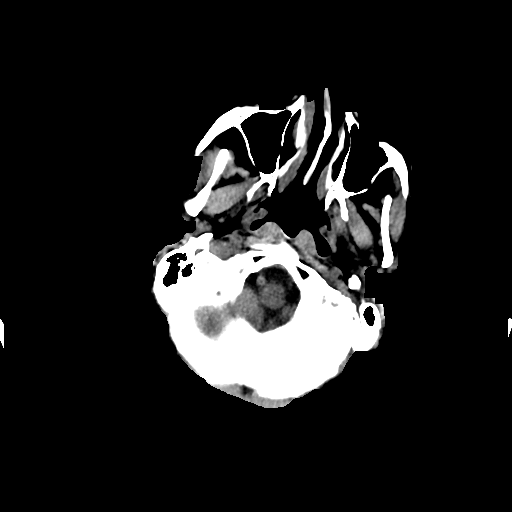
[im 4/32  bone]
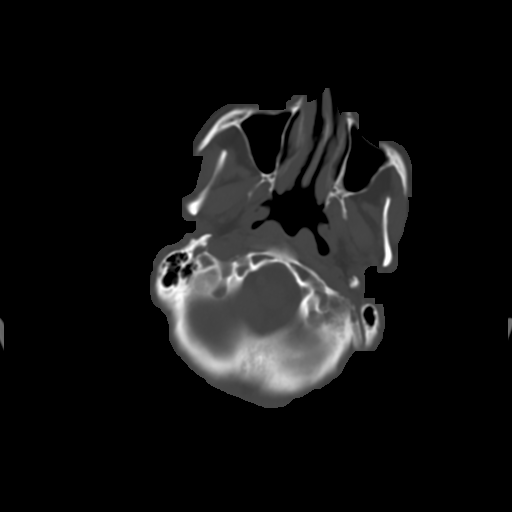
[im 7/32  brain]
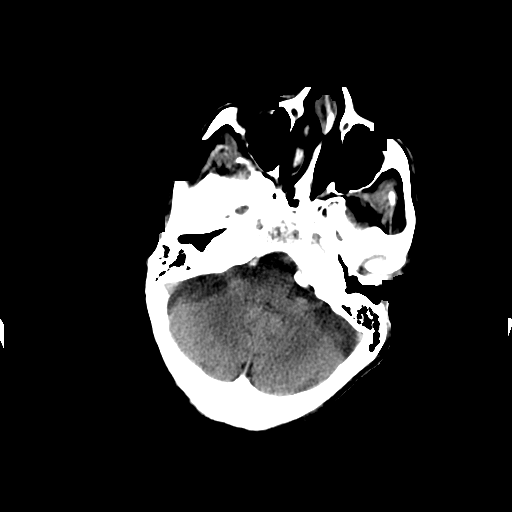
[im 11/32  brain]
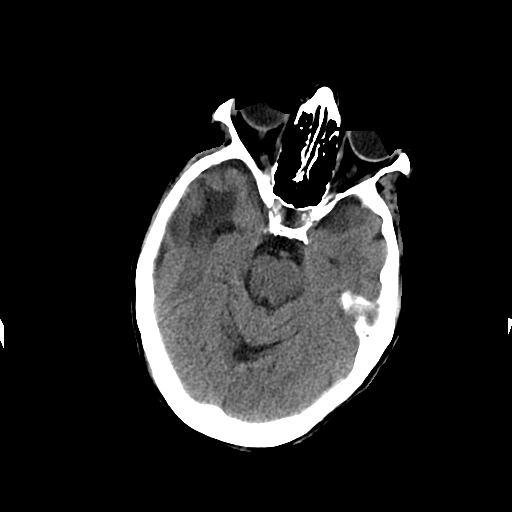
[im 14/32  brain]
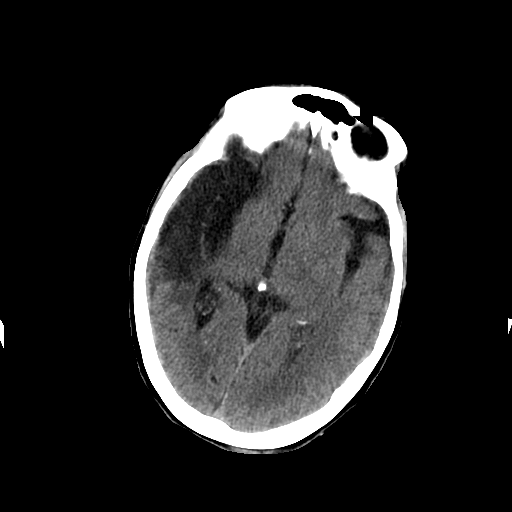
[im 18/32  brain]
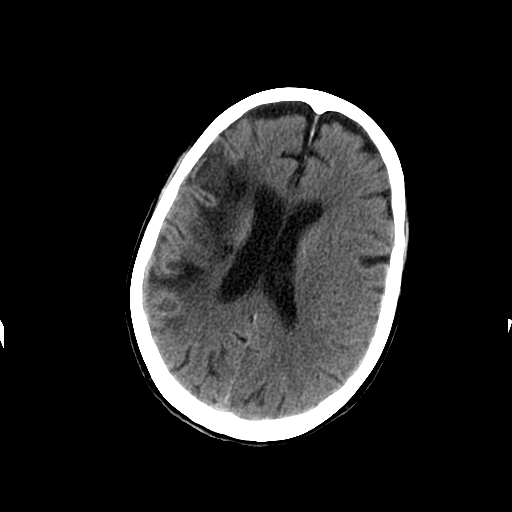
[im 18/32  bone]
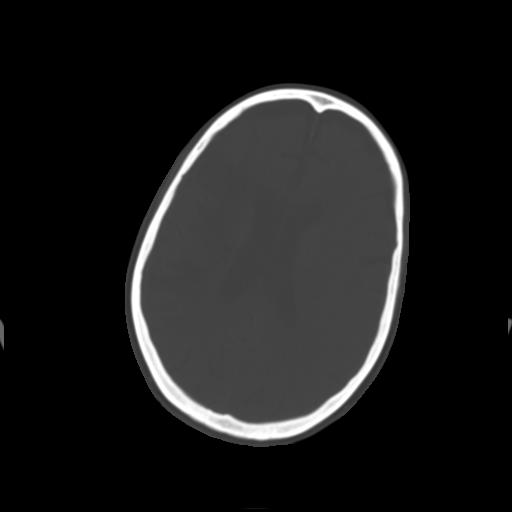
[im 21/32  brain]
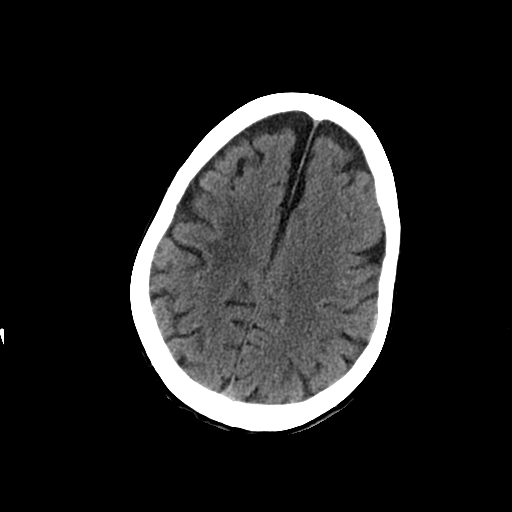
[im 25/32  brain]
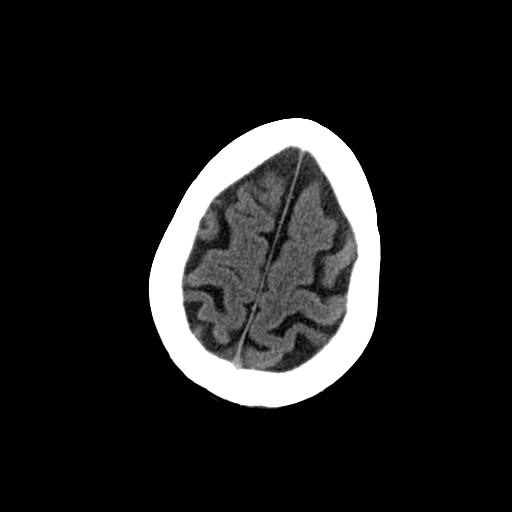
[im 28/32  brain]
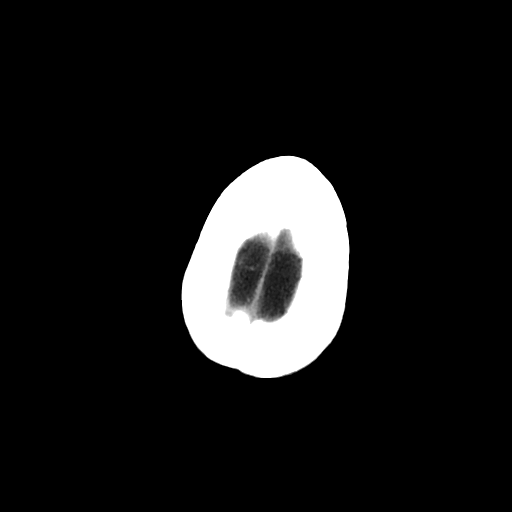

[Series 3: head w/o bone · axial · non-contrast · 0.49mm/px · z∈[+103,+226]mm · 8 of 63 slices shown]
[im 7/63  bone]
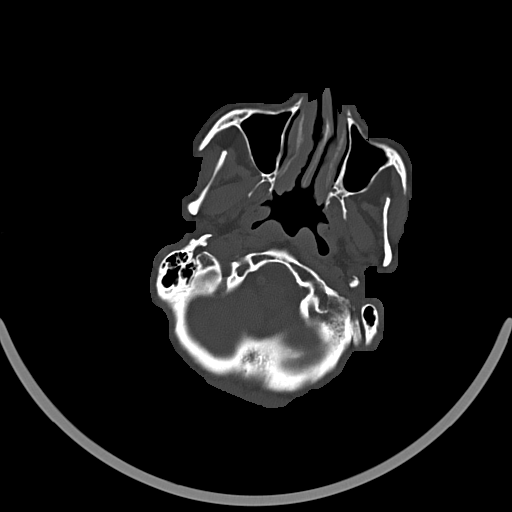
[im 14/63  bone]
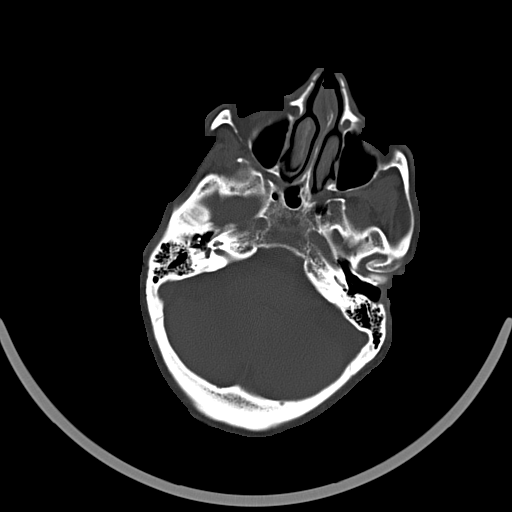
[im 20/63  bone]
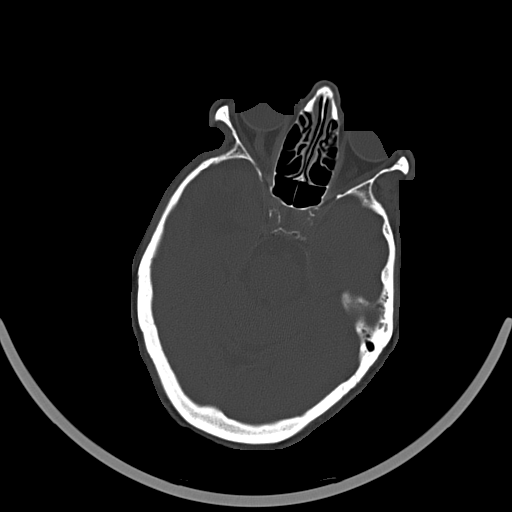
[im 27/63  bone]
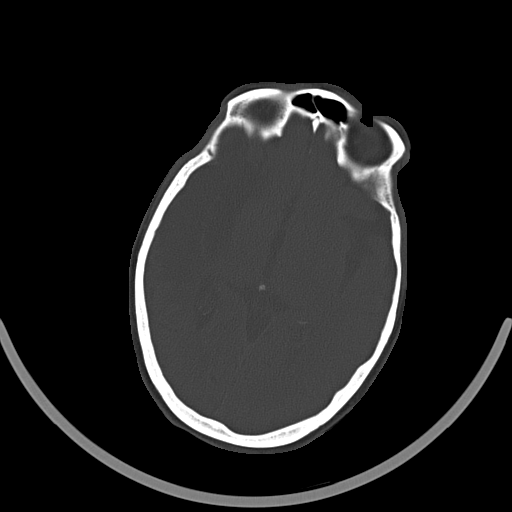
[im 36/63  bone]
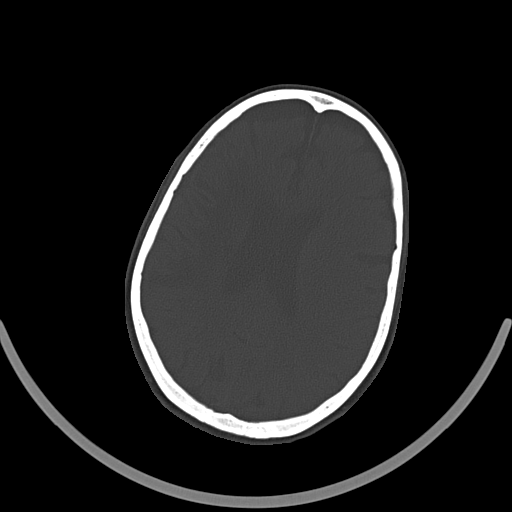
[im 43/63  bone]
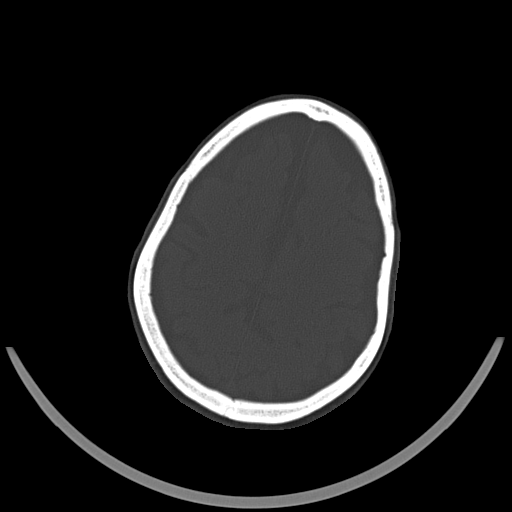
[im 49/63  bone]
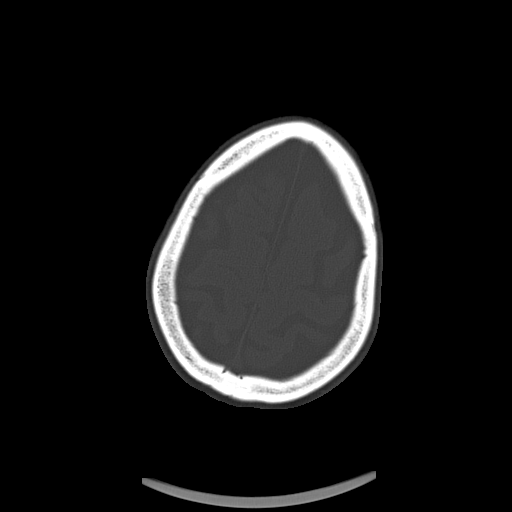
[im 56/63  bone]
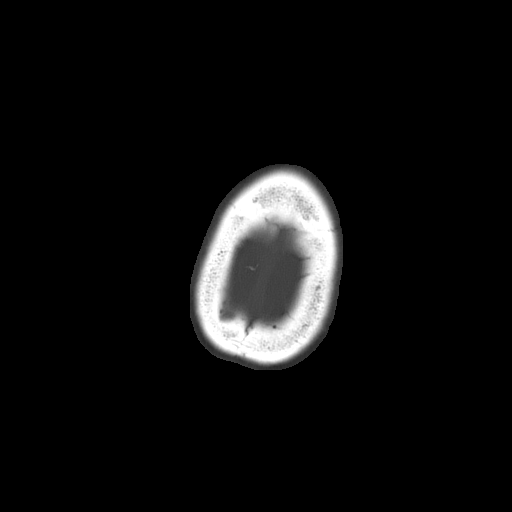

[16 of 30 positions shown; findings below may reference images not displayed]

FINDINGS: Series 2 image 20 raises possibility of small acute left
frontal lobe infarct.  It is possible this represents partial
volume averaging with a sulcus although different than on the prior
exam therefore infarct is of concern.

No intracranial hemorrhage.

Remote large right hemispheric infarct with encephalomalacia right
temporal lobe, opercular region and portion of the right parietal
lobe.  Subsequent mild dilation of the right lateral ventricle.  No
hydrocephalus.

Within the right pons (series 9 image 32), hypodensity is noted.
This may represent artifact although limited for detecting small
acute infarct.

No intracranial mass lesion detected on this unenhanced exam..

Mastoid air cells, middle ear cavities and visualized sinuses are
clear.  Orbital structures unremarkable.
IMPRESSION: Question small acute non hemorrhagic infarct left frontal lobe.

Remote large right hemispheric infarct.

Probable streak artifact right pons limiting evaluation for
detection of small acute infarct at this level.

Global atrophy without hydrocephalus.

Changes in to Dr. Dogaru as this is a code stroke.

## 2014-06-02 IMAGING — CR DG CHEST 2V
1 series · 1 of 1 positions shown · non-contrast
Comparison: February 05, 2013

CLINICAL DATA: Fatigue and hypertension

EXAM:
CHEST  2 VIEW

[w chest lat]
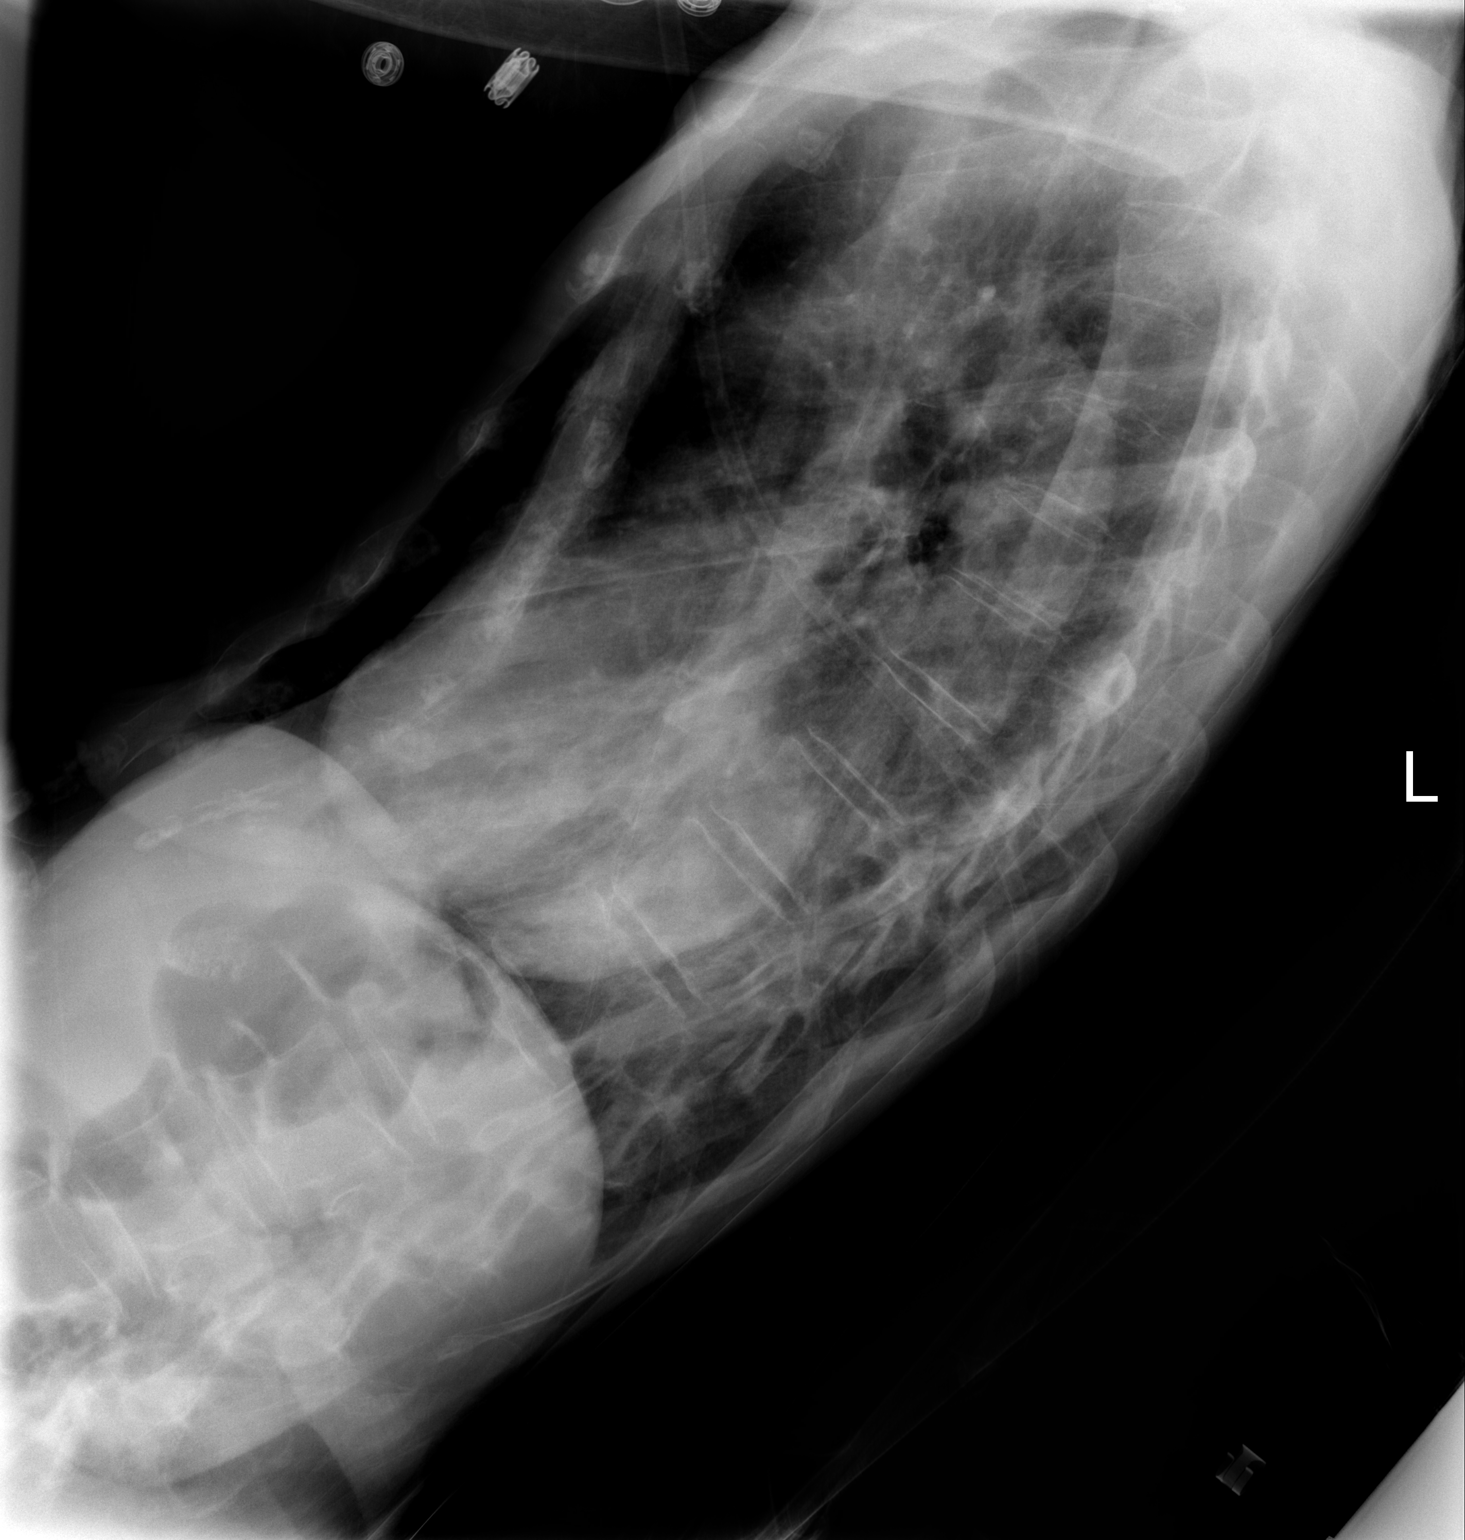

[1 of 1 positions shown; findings below may reference images not displayed]

FINDINGS: There is patchy infiltrate in the right base. There is underlying
emphysema. Heart size is normal. Pulmonary vascularity reflects
underlying emphysema.

There is marked pectus excavatum. No pneumothorax. No adenopathy.
There is atherosclerotic change in the aorta.
IMPRESSION: Patchy infiltrate right base. Underlying emphysema. Pectus
excavatum.

## 2020-03-20 ENCOUNTER — Telehealth: Payer: Self-pay | Admitting: *Deleted

## 2020-03-20 NOTE — Telephone Encounter (Signed)
error 

## 2024-07-20 DEATH — deceased
